# Patient Record
Sex: Female | Born: 1935 | Race: Black or African American | Hispanic: No | Smoking: Former smoker
Health system: Southern US, Community
[De-identification: ages and names within clinical notes are randomized; demographics above are authoritative.]

## PROBLEM LIST (undated history)

## (undated) DIAGNOSIS — R011 Cardiac murmur, unspecified: Secondary | ICD-10-CM

## (undated) DIAGNOSIS — I509 Heart failure, unspecified: Secondary | ICD-10-CM

## (undated) DIAGNOSIS — D126 Benign neoplasm of colon, unspecified: Secondary | ICD-10-CM

## (undated) DIAGNOSIS — J45909 Unspecified asthma, uncomplicated: Secondary | ICD-10-CM

## (undated) DIAGNOSIS — D509 Iron deficiency anemia, unspecified: Secondary | ICD-10-CM

## (undated) DIAGNOSIS — K219 Gastro-esophageal reflux disease without esophagitis: Secondary | ICD-10-CM

## (undated) DIAGNOSIS — R569 Unspecified convulsions: Secondary | ICD-10-CM

## (undated) DIAGNOSIS — K5520 Angiodysplasia of colon without hemorrhage: Secondary | ICD-10-CM

## (undated) DIAGNOSIS — F419 Anxiety disorder, unspecified: Secondary | ICD-10-CM

## (undated) DIAGNOSIS — M1990 Unspecified osteoarthritis, unspecified site: Secondary | ICD-10-CM

## (undated) DIAGNOSIS — K5790 Diverticulosis of intestine, part unspecified, without perforation or abscess without bleeding: Secondary | ICD-10-CM

## (undated) DIAGNOSIS — Z9289 Personal history of other medical treatment: Secondary | ICD-10-CM

## (undated) DIAGNOSIS — K222 Esophageal obstruction: Secondary | ICD-10-CM

## (undated) DIAGNOSIS — N189 Chronic kidney disease, unspecified: Secondary | ICD-10-CM

## (undated) DIAGNOSIS — N841 Polyp of cervix uteri: Secondary | ICD-10-CM

## (undated) DIAGNOSIS — K449 Diaphragmatic hernia without obstruction or gangrene: Secondary | ICD-10-CM

## (undated) DIAGNOSIS — I10 Essential (primary) hypertension: Secondary | ICD-10-CM

## (undated) DIAGNOSIS — I517 Cardiomegaly: Secondary | ICD-10-CM

## (undated) DIAGNOSIS — I2510 Atherosclerotic heart disease of native coronary artery without angina pectoris: Secondary | ICD-10-CM

## (undated) HISTORY — DX: Iron deficiency anemia, unspecified: D50.9

## (undated) HISTORY — PX: CORONARY ANGIOPLASTY: SHX604

## (undated) HISTORY — PX: LAPAROSCOPIC APPENDECTOMY: SUR753

## (undated) HISTORY — DX: Diverticulosis of intestine, part unspecified, without perforation or abscess without bleeding: K57.90

## (undated) HISTORY — DX: Unspecified convulsions: R56.9

## (undated) HISTORY — PX: KNEE ARTHROSCOPY: SUR90

## (undated) HISTORY — PX: COLONOSCOPY: SHX174

## (undated) HISTORY — PX: TONSILLECTOMY: SUR1361

## (undated) HISTORY — PX: COLONOSCOPY W/ BIOPSIES: SHX1374

## (undated) HISTORY — PX: LAPAROSCOPIC CHOLECYSTECTOMY: SUR755

## (undated) HISTORY — PX: CARPAL TUNNEL RELEASE: SHX101

## (undated) HISTORY — DX: Diaphragmatic hernia without obstruction or gangrene: K44.9

## (undated) HISTORY — PX: CARDIAC CATHETERIZATION: SHX172

## (undated) HISTORY — DX: Anxiety disorder, unspecified: F41.9

## (undated) HISTORY — PX: BREAST EXCISIONAL BIOPSY: SUR124

## (undated) HISTORY — DX: Unspecified osteoarthritis, unspecified site: M19.90

## (undated) HISTORY — DX: Benign neoplasm of colon, unspecified: D12.6

## (undated) HISTORY — DX: Atherosclerotic heart disease of native coronary artery without angina pectoris: I25.10

## (undated) HISTORY — DX: Cardiomegaly: I51.7

## (undated) HISTORY — DX: Polyp of cervix uteri: N84.1

## (undated) HISTORY — PX: CATARACT EXTRACTION: SUR2

## (undated) HISTORY — DX: Chronic kidney disease, unspecified: N18.9

## (undated) HISTORY — DX: Esophageal obstruction: K22.2

## (undated) HISTORY — DX: Angiodysplasia of colon without hemorrhage: K55.20

## (undated) HISTORY — PX: JOINT REPLACEMENT: SHX530

## (undated) HISTORY — PX: BREAST SURGERY: SHX581

## (undated) HISTORY — PX: OTHER SURGICAL HISTORY: SHX169

## (undated) HISTORY — DX: Gastro-esophageal reflux disease without esophagitis: K21.9

---

## 1997-12-11 HISTORY — PX: ANGIOPLASTY: SHX39

## 1998-03-17 ENCOUNTER — Other Ambulatory Visit: Admission: RE | Admit: 1998-03-17 | Discharge: 1998-03-17 | Payer: Self-pay | Admitting: Internal Medicine

## 1998-07-10 ENCOUNTER — Emergency Department (HOSPITAL_COMMUNITY): Admission: EM | Admit: 1998-07-10 | Discharge: 1998-07-10 | Payer: Self-pay | Admitting: Emergency Medicine

## 1998-07-23 ENCOUNTER — Ambulatory Visit (HOSPITAL_COMMUNITY): Admission: RE | Admit: 1998-07-23 | Discharge: 1998-07-23 | Payer: Self-pay | Admitting: Family Medicine

## 1998-08-31 ENCOUNTER — Encounter: Admission: RE | Admit: 1998-08-31 | Discharge: 1998-11-29 | Payer: Self-pay | Admitting: Orthopaedic Surgery

## 1999-03-23 ENCOUNTER — Other Ambulatory Visit: Admission: RE | Admit: 1999-03-23 | Discharge: 1999-03-23 | Payer: Self-pay | Admitting: Family Medicine

## 1999-03-30 ENCOUNTER — Ambulatory Visit (HOSPITAL_COMMUNITY): Admission: RE | Admit: 1999-03-30 | Discharge: 1999-03-30 | Payer: Self-pay | Admitting: Family Medicine

## 1999-04-29 ENCOUNTER — Ambulatory Visit (HOSPITAL_COMMUNITY): Admission: RE | Admit: 1999-04-29 | Discharge: 1999-04-29 | Payer: Self-pay | Admitting: Family Medicine

## 1999-05-19 ENCOUNTER — Ambulatory Visit (HOSPITAL_COMMUNITY): Admission: RE | Admit: 1999-05-19 | Discharge: 1999-05-19 | Payer: Self-pay

## 1999-12-28 ENCOUNTER — Encounter: Admission: RE | Admit: 1999-12-28 | Discharge: 2000-03-27 | Payer: Self-pay | Admitting: Podiatry

## 2000-01-28 ENCOUNTER — Encounter: Payer: Self-pay | Admitting: Orthopaedic Surgery

## 2000-01-28 ENCOUNTER — Encounter: Admission: RE | Admit: 2000-01-28 | Discharge: 2000-01-28 | Payer: Self-pay | Admitting: Orthopaedic Surgery

## 2000-04-20 ENCOUNTER — Encounter: Payer: Self-pay | Admitting: Family Medicine

## 2000-04-20 ENCOUNTER — Ambulatory Visit (HOSPITAL_COMMUNITY): Admission: RE | Admit: 2000-04-20 | Discharge: 2000-04-20 | Payer: Self-pay | Admitting: Family Medicine

## 2001-04-17 ENCOUNTER — Ambulatory Visit (HOSPITAL_COMMUNITY): Admission: RE | Admit: 2001-04-17 | Discharge: 2001-04-17 | Payer: Self-pay | Admitting: Gastroenterology

## 2001-04-17 ENCOUNTER — Encounter: Payer: Self-pay | Admitting: Gastroenterology

## 2001-06-18 ENCOUNTER — Encounter: Admission: RE | Admit: 2001-06-18 | Discharge: 2001-07-22 | Payer: Self-pay | Admitting: Orthopaedic Surgery

## 2001-10-04 ENCOUNTER — Ambulatory Visit (HOSPITAL_COMMUNITY): Admission: RE | Admit: 2001-10-04 | Discharge: 2001-10-04 | Payer: Self-pay | Admitting: Family Medicine

## 2001-10-04 ENCOUNTER — Encounter: Payer: Self-pay | Admitting: Family Medicine

## 2001-12-02 ENCOUNTER — Encounter: Payer: Self-pay | Admitting: Family Medicine

## 2001-12-02 ENCOUNTER — Ambulatory Visit (HOSPITAL_COMMUNITY): Admission: RE | Admit: 2001-12-02 | Discharge: 2001-12-02 | Payer: Self-pay | Admitting: Family Medicine

## 2001-12-09 ENCOUNTER — Other Ambulatory Visit: Admission: RE | Admit: 2001-12-09 | Discharge: 2001-12-09 | Payer: Self-pay | Admitting: Family Medicine

## 2001-12-18 ENCOUNTER — Ambulatory Visit (HOSPITAL_COMMUNITY): Admission: RE | Admit: 2001-12-18 | Discharge: 2001-12-18 | Payer: Self-pay | Admitting: Family Medicine

## 2002-01-16 ENCOUNTER — Ambulatory Visit (HOSPITAL_COMMUNITY): Admission: RE | Admit: 2002-01-16 | Discharge: 2002-01-16 | Payer: Self-pay | Admitting: Family Medicine

## 2002-01-16 ENCOUNTER — Encounter: Payer: Self-pay | Admitting: Family Medicine

## 2002-01-17 ENCOUNTER — Emergency Department (HOSPITAL_COMMUNITY): Admission: EM | Admit: 2002-01-17 | Discharge: 2002-01-17 | Payer: Self-pay | Admitting: Emergency Medicine

## 2002-01-17 ENCOUNTER — Encounter: Payer: Self-pay | Admitting: Emergency Medicine

## 2002-01-21 ENCOUNTER — Encounter: Payer: Self-pay | Admitting: *Deleted

## 2002-01-23 ENCOUNTER — Inpatient Hospital Stay (HOSPITAL_COMMUNITY): Admission: RE | Admit: 2002-01-23 | Discharge: 2002-01-29 | Payer: Self-pay | Admitting: *Deleted

## 2002-01-23 HISTORY — PX: OTHER SURGICAL HISTORY: SHX169

## 2002-01-27 ENCOUNTER — Encounter: Payer: Self-pay | Admitting: *Deleted

## 2002-01-29 ENCOUNTER — Inpatient Hospital Stay (HOSPITAL_COMMUNITY)
Admission: RE | Admit: 2002-01-29 | Discharge: 2002-02-04 | Payer: Self-pay | Admitting: Physical Medicine & Rehabilitation

## 2002-02-02 ENCOUNTER — Encounter: Payer: Self-pay | Admitting: Physical Medicine & Rehabilitation

## 2002-04-10 ENCOUNTER — Inpatient Hospital Stay (HOSPITAL_COMMUNITY): Admission: RE | Admit: 2002-04-10 | Discharge: 2002-04-15 | Payer: Self-pay | Admitting: *Deleted

## 2002-04-15 ENCOUNTER — Inpatient Hospital Stay (HOSPITAL_COMMUNITY)
Admission: AD | Admit: 2002-04-15 | Discharge: 2002-04-22 | Payer: Self-pay | Admitting: Physical Medicine & Rehabilitation

## 2002-05-24 ENCOUNTER — Encounter: Payer: Self-pay | Admitting: Emergency Medicine

## 2002-05-24 ENCOUNTER — Emergency Department (HOSPITAL_COMMUNITY): Admission: EM | Admit: 2002-05-24 | Discharge: 2002-05-24 | Payer: Self-pay

## 2002-11-10 ENCOUNTER — Encounter: Payer: Self-pay | Admitting: Family Medicine

## 2002-11-10 ENCOUNTER — Ambulatory Visit (HOSPITAL_COMMUNITY): Admission: RE | Admit: 2002-11-10 | Discharge: 2002-11-10 | Payer: Self-pay | Admitting: Family Medicine

## 2002-12-24 ENCOUNTER — Ambulatory Visit (HOSPITAL_COMMUNITY): Admission: RE | Admit: 2002-12-24 | Discharge: 2002-12-24 | Payer: Self-pay | Admitting: Otolaryngology

## 2002-12-24 ENCOUNTER — Encounter: Payer: Self-pay | Admitting: Otolaryngology

## 2003-12-29 ENCOUNTER — Inpatient Hospital Stay (HOSPITAL_COMMUNITY): Admission: RE | Admit: 2003-12-29 | Discharge: 2004-01-05 | Payer: Self-pay | Admitting: Orthopedic Surgery

## 2004-01-05 ENCOUNTER — Inpatient Hospital Stay (HOSPITAL_COMMUNITY)
Admission: RE | Admit: 2004-01-05 | Discharge: 2004-01-13 | Payer: Self-pay | Admitting: Physical Medicine & Rehabilitation

## 2004-05-06 ENCOUNTER — Encounter: Admission: RE | Admit: 2004-05-06 | Discharge: 2004-05-06 | Payer: Self-pay | Admitting: Family Medicine

## 2004-05-10 ENCOUNTER — Ambulatory Visit (HOSPITAL_COMMUNITY): Admission: RE | Admit: 2004-05-10 | Discharge: 2004-05-10 | Payer: Self-pay | Admitting: Family Medicine

## 2004-06-01 ENCOUNTER — Encounter (INDEPENDENT_AMBULATORY_CARE_PROVIDER_SITE_OTHER): Payer: Self-pay | Admitting: *Deleted

## 2004-06-01 ENCOUNTER — Ambulatory Visit (HOSPITAL_COMMUNITY): Admission: RE | Admit: 2004-06-01 | Discharge: 2004-06-01 | Payer: Self-pay | Admitting: General Surgery

## 2004-06-01 HISTORY — PX: OTHER SURGICAL HISTORY: SHX169

## 2004-09-02 ENCOUNTER — Emergency Department (HOSPITAL_COMMUNITY): Admission: EM | Admit: 2004-09-02 | Discharge: 2004-09-02 | Payer: Self-pay | Admitting: Emergency Medicine

## 2004-10-03 ENCOUNTER — Ambulatory Visit: Payer: Self-pay | Admitting: Family Medicine

## 2004-11-22 ENCOUNTER — Ambulatory Visit: Payer: Self-pay | Admitting: Family Medicine

## 2004-12-19 ENCOUNTER — Ambulatory Visit: Payer: Self-pay | Admitting: Family Medicine

## 2005-02-13 ENCOUNTER — Ambulatory Visit: Payer: Self-pay | Admitting: Family Medicine

## 2005-04-10 ENCOUNTER — Emergency Department (HOSPITAL_COMMUNITY): Admission: EM | Admit: 2005-04-10 | Discharge: 2005-04-10 | Payer: Self-pay

## 2005-06-02 ENCOUNTER — Ambulatory Visit: Payer: Self-pay | Admitting: Family Medicine

## 2005-08-15 ENCOUNTER — Ambulatory Visit: Payer: Self-pay | Admitting: Family Medicine

## 2005-09-11 ENCOUNTER — Ambulatory Visit (HOSPITAL_COMMUNITY): Admission: RE | Admit: 2005-09-11 | Discharge: 2005-09-11 | Payer: Self-pay | Admitting: Ophthalmology

## 2005-09-26 ENCOUNTER — Ambulatory Visit: Payer: Self-pay | Admitting: Family Medicine

## 2005-09-28 ENCOUNTER — Ambulatory Visit: Payer: Self-pay | Admitting: Family Medicine

## 2005-10-05 ENCOUNTER — Encounter: Admission: RE | Admit: 2005-10-05 | Discharge: 2005-10-05 | Payer: Self-pay | Admitting: General Surgery

## 2005-11-13 ENCOUNTER — Ambulatory Visit: Payer: Self-pay | Admitting: Family Medicine

## 2005-12-18 ENCOUNTER — Ambulatory Visit: Payer: Self-pay | Admitting: Family Medicine

## 2006-01-22 ENCOUNTER — Ambulatory Visit: Payer: Self-pay | Admitting: Family Medicine

## 2006-02-23 ENCOUNTER — Ambulatory Visit: Payer: Self-pay | Admitting: Family Medicine

## 2006-02-23 ENCOUNTER — Observation Stay (HOSPITAL_COMMUNITY): Admission: EM | Admit: 2006-02-23 | Discharge: 2006-02-24 | Payer: Self-pay | Admitting: Emergency Medicine

## 2006-02-23 ENCOUNTER — Encounter (INDEPENDENT_AMBULATORY_CARE_PROVIDER_SITE_OTHER): Payer: Self-pay | Admitting: *Deleted

## 2006-03-03 ENCOUNTER — Emergency Department (HOSPITAL_COMMUNITY): Admission: EM | Admit: 2006-03-03 | Discharge: 2006-03-03 | Payer: Self-pay | Admitting: Emergency Medicine

## 2006-03-15 ENCOUNTER — Emergency Department (HOSPITAL_COMMUNITY): Admission: EM | Admit: 2006-03-15 | Discharge: 2006-03-15 | Payer: Self-pay | Admitting: *Deleted

## 2006-03-15 ENCOUNTER — Ambulatory Visit: Payer: Self-pay | Admitting: Family Medicine

## 2006-04-10 ENCOUNTER — Ambulatory Visit: Payer: Self-pay | Admitting: Family Medicine

## 2006-05-15 ENCOUNTER — Ambulatory Visit: Payer: Self-pay | Admitting: Family Medicine

## 2006-05-25 ENCOUNTER — Other Ambulatory Visit: Admission: RE | Admit: 2006-05-25 | Discharge: 2006-05-25 | Payer: Self-pay | Admitting: Family Medicine

## 2006-05-25 ENCOUNTER — Encounter: Payer: Self-pay | Admitting: Family Medicine

## 2006-05-25 ENCOUNTER — Ambulatory Visit: Payer: Self-pay | Admitting: Family Medicine

## 2006-06-08 ENCOUNTER — Encounter (INDEPENDENT_AMBULATORY_CARE_PROVIDER_SITE_OTHER): Payer: Self-pay | Admitting: Family Medicine

## 2006-06-08 LAB — CONVERTED CEMR LAB: Pap Smear: NORMAL

## 2006-06-27 ENCOUNTER — Ambulatory Visit: Payer: Self-pay | Admitting: Family Medicine

## 2006-07-13 ENCOUNTER — Emergency Department (HOSPITAL_COMMUNITY): Admission: EM | Admit: 2006-07-13 | Discharge: 2006-07-14 | Payer: Self-pay | Admitting: Emergency Medicine

## 2006-07-16 ENCOUNTER — Ambulatory Visit: Payer: Self-pay | Admitting: Internal Medicine

## 2006-07-30 ENCOUNTER — Emergency Department (HOSPITAL_COMMUNITY): Admission: EM | Admit: 2006-07-30 | Discharge: 2006-07-30 | Payer: Self-pay | Admitting: Emergency Medicine

## 2006-08-03 ENCOUNTER — Ambulatory Visit: Payer: Self-pay | Admitting: Internal Medicine

## 2006-08-07 ENCOUNTER — Ambulatory Visit: Payer: Self-pay | Admitting: Family Medicine

## 2006-08-08 ENCOUNTER — Ambulatory Visit (HOSPITAL_COMMUNITY): Admission: RE | Admit: 2006-08-08 | Discharge: 2006-08-08 | Payer: Self-pay | Admitting: General Surgery

## 2006-08-08 HISTORY — PX: INGUINAL HERNIA REPAIR: SUR1180

## 2006-10-16 ENCOUNTER — Encounter: Admission: RE | Admit: 2006-10-16 | Discharge: 2006-10-16 | Payer: Self-pay | Admitting: General Surgery

## 2006-12-06 ENCOUNTER — Emergency Department (HOSPITAL_COMMUNITY): Admission: EM | Admit: 2006-12-06 | Discharge: 2006-12-07 | Payer: Self-pay | Admitting: Emergency Medicine

## 2006-12-18 HISTORY — PX: OTHER SURGICAL HISTORY: SHX169

## 2006-12-19 ENCOUNTER — Inpatient Hospital Stay (HOSPITAL_COMMUNITY): Admission: RE | Admit: 2006-12-19 | Discharge: 2006-12-21 | Payer: Self-pay | Admitting: Orthopedic Surgery

## 2007-01-18 ENCOUNTER — Inpatient Hospital Stay (HOSPITAL_COMMUNITY): Admission: EM | Admit: 2007-01-18 | Discharge: 2007-01-25 | Payer: Self-pay | Admitting: Emergency Medicine

## 2007-01-21 ENCOUNTER — Encounter (INDEPENDENT_AMBULATORY_CARE_PROVIDER_SITE_OTHER): Payer: Self-pay | Admitting: *Deleted

## 2007-02-12 ENCOUNTER — Ambulatory Visit: Payer: Self-pay | Admitting: Family Medicine

## 2007-02-19 ENCOUNTER — Ambulatory Visit: Payer: Self-pay | Admitting: Family Medicine

## 2007-02-25 ENCOUNTER — Ambulatory Visit (HOSPITAL_COMMUNITY): Admission: RE | Admit: 2007-02-25 | Discharge: 2007-02-25 | Payer: Self-pay | Admitting: Gastroenterology

## 2007-04-09 ENCOUNTER — Encounter: Admission: RE | Admit: 2007-04-09 | Discharge: 2007-07-08 | Payer: Self-pay | Admitting: Orthopedic Surgery

## 2007-05-08 ENCOUNTER — Ambulatory Visit: Payer: Self-pay | Admitting: Family Medicine

## 2007-06-22 ENCOUNTER — Encounter (INDEPENDENT_AMBULATORY_CARE_PROVIDER_SITE_OTHER): Payer: Self-pay | Admitting: Family Medicine

## 2007-06-22 DIAGNOSIS — I10 Essential (primary) hypertension: Secondary | ICD-10-CM

## 2007-06-26 ENCOUNTER — Ambulatory Visit: Payer: Self-pay | Admitting: Family Medicine

## 2007-07-29 ENCOUNTER — Ambulatory Visit: Payer: Self-pay | Admitting: Family Medicine

## 2007-07-30 ENCOUNTER — Ambulatory Visit: Payer: Self-pay | Admitting: Family Medicine

## 2007-07-31 ENCOUNTER — Encounter (INDEPENDENT_AMBULATORY_CARE_PROVIDER_SITE_OTHER): Payer: Self-pay | Admitting: Family Medicine

## 2007-07-31 LAB — CONVERTED CEMR LAB
Alkaline Phosphatase: 130 units/L — ABNORMAL HIGH (ref 39–117)
Basophils Relative: 0 % (ref 0–1)
Cholesterol: 204 mg/dL — ABNORMAL HIGH (ref 0–200)
Hemoglobin: 12.7 g/dL (ref 12.0–15.0)
LDL Cholesterol: 116 mg/dL — ABNORMAL HIGH (ref 0–99)
Lymphs Abs: 2.2 10*3/uL (ref 0.7–3.3)
MCV: 94.4 fL (ref 78.0–100.0)
Monocytes Absolute: 0.4 10*3/uL (ref 0.2–0.7)
Monocytes Relative: 6 % (ref 3–11)
Neutrophils Relative %: 56 % (ref 43–77)
Potassium: 4.2 meq/L (ref 3.5–5.3)
RBC: 4.12 M/uL (ref 3.87–5.11)
RDW: 13.2 % (ref 11.5–14.0)
Sodium: 133 meq/L — ABNORMAL LOW (ref 135–145)
Total Bilirubin: 0.4 mg/dL (ref 0.3–1.2)
Total CHOL/HDL Ratio: 3.1
VLDL: 23 mg/dL (ref 0–40)
WBC: 6.1 10*3/uL (ref 4.0–10.5)

## 2007-09-04 ENCOUNTER — Ambulatory Visit: Payer: Self-pay | Admitting: Nurse Practitioner

## 2007-09-04 ENCOUNTER — Encounter (INDEPENDENT_AMBULATORY_CARE_PROVIDER_SITE_OTHER): Payer: Self-pay | Admitting: Family Medicine

## 2007-09-16 ENCOUNTER — Encounter (INDEPENDENT_AMBULATORY_CARE_PROVIDER_SITE_OTHER): Payer: Self-pay | Admitting: Family Medicine

## 2007-09-24 ENCOUNTER — Ambulatory Visit: Payer: Self-pay | Admitting: Vascular Surgery

## 2007-10-21 ENCOUNTER — Encounter: Admission: RE | Admit: 2007-10-21 | Discharge: 2007-10-21 | Payer: Self-pay | Admitting: Family Medicine

## 2007-10-22 ENCOUNTER — Ambulatory Visit: Payer: Self-pay | Admitting: Family Medicine

## 2007-11-12 ENCOUNTER — Ambulatory Visit: Payer: Self-pay | Admitting: Internal Medicine

## 2007-11-12 ENCOUNTER — Ambulatory Visit (HOSPITAL_COMMUNITY): Admission: RE | Admit: 2007-11-12 | Discharge: 2007-11-12 | Payer: Self-pay | Admitting: Internal Medicine

## 2007-11-12 LAB — CONVERTED CEMR LAB
ALT: 12 units/L (ref 0–35)
Albumin: 4 g/dL (ref 3.5–5.2)
Alkaline Phosphatase: 139 units/L — ABNORMAL HIGH (ref 39–117)
CO2: 22 meq/L (ref 19–32)
Calcium: 9.4 mg/dL (ref 8.4–10.5)
Chloride: 101 meq/L (ref 96–112)
Glucose, Bld: 84 mg/dL (ref 70–99)
Hemoglobin: 11.5 g/dL — ABNORMAL LOW (ref 12.0–15.0)
MCHC: 33 g/dL (ref 30.0–36.0)
Monocytes Absolute: 0.5 10*3/uL (ref 0.1–1.0)
Neutrophils Relative %: 67 % (ref 43–77)
RBC: 3.72 M/uL — ABNORMAL LOW (ref 3.87–5.11)
Total Bilirubin: 0.6 mg/dL (ref 0.3–1.2)
Total Protein: 7 g/dL (ref 6.0–8.3)
WBC: 7.9 10*3/uL (ref 4.0–10.5)

## 2007-11-22 ENCOUNTER — Emergency Department (HOSPITAL_COMMUNITY): Admission: EM | Admit: 2007-11-22 | Discharge: 2007-11-22 | Payer: Self-pay | Admitting: Emergency Medicine

## 2007-11-26 ENCOUNTER — Emergency Department (HOSPITAL_COMMUNITY): Admission: EM | Admit: 2007-11-26 | Discharge: 2007-11-26 | Payer: Self-pay | Admitting: Emergency Medicine

## 2007-12-13 ENCOUNTER — Encounter (INDEPENDENT_AMBULATORY_CARE_PROVIDER_SITE_OTHER): Payer: Self-pay | Admitting: Family Medicine

## 2007-12-26 ENCOUNTER — Encounter (HOSPITAL_BASED_OUTPATIENT_CLINIC_OR_DEPARTMENT_OTHER): Payer: Self-pay | Admitting: General Surgery

## 2007-12-26 ENCOUNTER — Ambulatory Visit (HOSPITAL_COMMUNITY): Admission: RE | Admit: 2007-12-26 | Discharge: 2007-12-27 | Payer: Self-pay | Admitting: General Surgery

## 2008-01-15 ENCOUNTER — Ambulatory Visit: Payer: Self-pay | Admitting: Family Medicine

## 2008-01-15 LAB — CONVERTED CEMR LAB
Alkaline Phosphatase: 108 units/L (ref 39–117)
CO2: 26 meq/L (ref 19–32)
Chloride: 95 meq/L — ABNORMAL LOW (ref 96–112)
Creatinine, Ser: 1.22 mg/dL — ABNORMAL HIGH (ref 0.40–1.20)
Glucose, Bld: 92 mg/dL (ref 70–99)
Phenobarbital: 45.3 ug/mL — ABNORMAL HIGH (ref 15.0–40.0)
Potassium: 3.4 meq/L — ABNORMAL LOW (ref 3.5–5.3)
Sodium: 136 meq/L (ref 135–145)

## 2008-01-16 ENCOUNTER — Telehealth (INDEPENDENT_AMBULATORY_CARE_PROVIDER_SITE_OTHER): Payer: Self-pay | Admitting: Nurse Practitioner

## 2008-01-19 ENCOUNTER — Emergency Department (HOSPITAL_COMMUNITY): Admission: EM | Admit: 2008-01-19 | Discharge: 2008-01-20 | Payer: Self-pay | Admitting: Emergency Medicine

## 2008-01-21 ENCOUNTER — Telehealth (INDEPENDENT_AMBULATORY_CARE_PROVIDER_SITE_OTHER): Payer: Self-pay | Admitting: Family Medicine

## 2008-01-24 ENCOUNTER — Ambulatory Visit (HOSPITAL_COMMUNITY): Admission: RE | Admit: 2008-01-24 | Discharge: 2008-01-24 | Payer: Self-pay | Admitting: Family Medicine

## 2008-01-27 ENCOUNTER — Telehealth (INDEPENDENT_AMBULATORY_CARE_PROVIDER_SITE_OTHER): Payer: Self-pay | Admitting: Family Medicine

## 2008-01-30 ENCOUNTER — Encounter (INDEPENDENT_AMBULATORY_CARE_PROVIDER_SITE_OTHER): Payer: Self-pay | Admitting: Family Medicine

## 2008-02-05 DIAGNOSIS — G40909 Epilepsy, unspecified, not intractable, without status epilepticus: Secondary | ICD-10-CM | POA: Insufficient documentation

## 2008-02-07 ENCOUNTER — Encounter (INDEPENDENT_AMBULATORY_CARE_PROVIDER_SITE_OTHER): Payer: Self-pay | Admitting: Family Medicine

## 2008-02-12 ENCOUNTER — Encounter (INDEPENDENT_AMBULATORY_CARE_PROVIDER_SITE_OTHER): Payer: Self-pay | Admitting: Family Medicine

## 2008-02-14 ENCOUNTER — Ambulatory Visit: Payer: Self-pay | Admitting: Internal Medicine

## 2008-02-19 ENCOUNTER — Emergency Department (HOSPITAL_COMMUNITY): Admission: EM | Admit: 2008-02-19 | Discharge: 2008-02-19 | Payer: Self-pay | Admitting: Emergency Medicine

## 2008-02-26 ENCOUNTER — Encounter (INDEPENDENT_AMBULATORY_CARE_PROVIDER_SITE_OTHER): Payer: Self-pay | Admitting: Family Medicine

## 2008-03-04 ENCOUNTER — Encounter (INDEPENDENT_AMBULATORY_CARE_PROVIDER_SITE_OTHER): Payer: Self-pay | Admitting: Family Medicine

## 2008-03-09 ENCOUNTER — Ambulatory Visit: Payer: Self-pay | Admitting: Family Medicine

## 2008-03-13 ENCOUNTER — Encounter (INDEPENDENT_AMBULATORY_CARE_PROVIDER_SITE_OTHER): Payer: Self-pay | Admitting: Family Medicine

## 2008-03-13 LAB — CONVERTED CEMR LAB
Creatinine 24 HR UR: 849 mg/24hr (ref 700–1800)
Creatinine, Urine: 31.4 mg/dL
Protein, Ur: 594 mg/24hr — ABNORMAL HIGH (ref 50–100)

## 2008-03-16 ENCOUNTER — Telehealth (INDEPENDENT_AMBULATORY_CARE_PROVIDER_SITE_OTHER): Payer: Self-pay | Admitting: Family Medicine

## 2008-03-25 ENCOUNTER — Encounter (INDEPENDENT_AMBULATORY_CARE_PROVIDER_SITE_OTHER): Payer: Self-pay | Admitting: Family Medicine

## 2008-03-25 LAB — CONVERTED CEMR LAB
Creatinine 24 HR UR: 836 mg/24hr (ref 700–1800)
Creatinine, Urine: 31 mg/dL

## 2008-05-08 ENCOUNTER — Encounter (INDEPENDENT_AMBULATORY_CARE_PROVIDER_SITE_OTHER): Payer: Self-pay | Admitting: Family Medicine

## 2008-07-07 ENCOUNTER — Encounter (INDEPENDENT_AMBULATORY_CARE_PROVIDER_SITE_OTHER): Payer: Self-pay | Admitting: Family Medicine

## 2008-09-08 ENCOUNTER — Telehealth (INDEPENDENT_AMBULATORY_CARE_PROVIDER_SITE_OTHER): Payer: Self-pay | Admitting: Family Medicine

## 2008-09-21 ENCOUNTER — Encounter (INDEPENDENT_AMBULATORY_CARE_PROVIDER_SITE_OTHER): Payer: Self-pay | Admitting: Family Medicine

## 2008-10-12 ENCOUNTER — Telehealth (INDEPENDENT_AMBULATORY_CARE_PROVIDER_SITE_OTHER): Payer: Self-pay | Admitting: Family Medicine

## 2008-11-03 ENCOUNTER — Telehealth (INDEPENDENT_AMBULATORY_CARE_PROVIDER_SITE_OTHER): Payer: Self-pay | Admitting: Family Medicine

## 2008-11-09 ENCOUNTER — Ambulatory Visit: Payer: Self-pay | Admitting: Family Medicine

## 2008-12-24 ENCOUNTER — Encounter: Admission: RE | Admit: 2008-12-24 | Discharge: 2008-12-24 | Payer: Self-pay | Admitting: Family Medicine

## 2008-12-31 ENCOUNTER — Encounter (INDEPENDENT_AMBULATORY_CARE_PROVIDER_SITE_OTHER): Payer: Self-pay | Admitting: Family Medicine

## 2009-01-06 ENCOUNTER — Ambulatory Visit: Payer: Self-pay | Admitting: Nurse Practitioner

## 2009-01-07 ENCOUNTER — Telehealth (INDEPENDENT_AMBULATORY_CARE_PROVIDER_SITE_OTHER): Payer: Self-pay | Admitting: *Deleted

## 2009-01-14 ENCOUNTER — Encounter (INDEPENDENT_AMBULATORY_CARE_PROVIDER_SITE_OTHER): Payer: Self-pay | Admitting: Family Medicine

## 2009-01-15 ENCOUNTER — Telehealth (INDEPENDENT_AMBULATORY_CARE_PROVIDER_SITE_OTHER): Payer: Self-pay | Admitting: *Deleted

## 2009-01-20 ENCOUNTER — Ambulatory Visit: Payer: Self-pay | Admitting: Family Medicine

## 2009-01-21 ENCOUNTER — Telehealth (INDEPENDENT_AMBULATORY_CARE_PROVIDER_SITE_OTHER): Payer: Self-pay | Admitting: *Deleted

## 2009-01-21 ENCOUNTER — Encounter (INDEPENDENT_AMBULATORY_CARE_PROVIDER_SITE_OTHER): Payer: Self-pay | Admitting: Family Medicine

## 2009-01-28 ENCOUNTER — Encounter (INDEPENDENT_AMBULATORY_CARE_PROVIDER_SITE_OTHER): Payer: Self-pay | Admitting: Family Medicine

## 2009-02-22 ENCOUNTER — Encounter (INDEPENDENT_AMBULATORY_CARE_PROVIDER_SITE_OTHER): Payer: Self-pay | Admitting: Family Medicine

## 2009-03-15 ENCOUNTER — Telehealth (INDEPENDENT_AMBULATORY_CARE_PROVIDER_SITE_OTHER): Payer: Self-pay | Admitting: Family Medicine

## 2009-03-17 ENCOUNTER — Ambulatory Visit: Payer: Self-pay | Admitting: Family Medicine

## 2009-03-19 ENCOUNTER — Encounter (INDEPENDENT_AMBULATORY_CARE_PROVIDER_SITE_OTHER): Payer: Self-pay | Admitting: Family Medicine

## 2009-03-21 ENCOUNTER — Telehealth (INDEPENDENT_AMBULATORY_CARE_PROVIDER_SITE_OTHER): Payer: Self-pay | Admitting: Family Medicine

## 2009-03-21 LAB — CONVERTED CEMR LAB
AST: 16 units/L (ref 0–37)
Basophils Absolute: 0 10*3/uL (ref 0.0–0.1)
Calcium: 9.6 mg/dL (ref 8.4–10.5)
Cholesterol: 206 mg/dL — ABNORMAL HIGH (ref 0–200)
Eosinophils Relative: 2 % (ref 0–5)
Lymphs Abs: 1.9 10*3/uL (ref 0.7–4.0)
MCHC: 32.3 g/dL (ref 30.0–36.0)
MCV: 93.9 fL (ref 78.0–100.0)
Monocytes Relative: 8 % (ref 3–12)
Neutro Abs: 4.6 10*3/uL (ref 1.7–7.7)
Neutrophils Relative %: 63 % (ref 43–77)
Potassium: 4.7 meq/L (ref 3.5–5.3)
Total CHOL/HDL Ratio: 2.8
Triglycerides: 106 mg/dL (ref <150)

## 2009-03-24 ENCOUNTER — Encounter (INDEPENDENT_AMBULATORY_CARE_PROVIDER_SITE_OTHER): Payer: Self-pay | Admitting: Family Medicine

## 2009-04-05 ENCOUNTER — Ambulatory Visit: Payer: Self-pay | Admitting: Family Medicine

## 2009-04-08 LAB — CONVERTED CEMR LAB
BUN: 23 mg/dL (ref 6–23)
CO2: 18 meq/L — ABNORMAL LOW (ref 19–32)
Calcium: 9.3 mg/dL (ref 8.4–10.5)
Chloride: 99 meq/L (ref 96–112)
Creatinine, Ser: 1.48 mg/dL — ABNORMAL HIGH (ref 0.40–1.20)
Ferritin: 31 ng/mL (ref 10–291)
Glucose, Bld: 69 mg/dL — ABNORMAL LOW (ref 70–99)
Iron: 62 ug/dL (ref 42–145)
Potassium: 4.9 meq/L (ref 3.5–5.3)
Saturation Ratios: 24 % (ref 20–55)
Sodium: 135 meq/L (ref 135–145)
TIBC: 260 ug/dL (ref 250–470)
UIBC: 198 ug/dL

## 2009-04-09 ENCOUNTER — Telehealth (INDEPENDENT_AMBULATORY_CARE_PROVIDER_SITE_OTHER): Payer: Self-pay | Admitting: *Deleted

## 2009-04-26 ENCOUNTER — Encounter (INDEPENDENT_AMBULATORY_CARE_PROVIDER_SITE_OTHER): Payer: Self-pay | Admitting: Family Medicine

## 2009-04-28 ENCOUNTER — Encounter (INDEPENDENT_AMBULATORY_CARE_PROVIDER_SITE_OTHER): Payer: Self-pay | Admitting: Family Medicine

## 2009-05-03 ENCOUNTER — Emergency Department (HOSPITAL_COMMUNITY): Admission: EM | Admit: 2009-05-03 | Discharge: 2009-05-03 | Payer: Self-pay | Admitting: Emergency Medicine

## 2009-05-17 ENCOUNTER — Telehealth (INDEPENDENT_AMBULATORY_CARE_PROVIDER_SITE_OTHER): Payer: Self-pay | Admitting: Family Medicine

## 2009-05-18 ENCOUNTER — Encounter: Admission: RE | Admit: 2009-05-18 | Discharge: 2009-05-18 | Payer: Self-pay | Admitting: Podiatry

## 2009-06-04 ENCOUNTER — Encounter (INDEPENDENT_AMBULATORY_CARE_PROVIDER_SITE_OTHER): Payer: Self-pay | Admitting: Nurse Practitioner

## 2009-06-04 ENCOUNTER — Ambulatory Visit: Payer: Self-pay | Admitting: Internal Medicine

## 2009-06-07 LAB — CONVERTED CEMR LAB: Sodium: 136 meq/L (ref 135–145)

## 2009-06-08 ENCOUNTER — Emergency Department (HOSPITAL_COMMUNITY): Admission: EM | Admit: 2009-06-08 | Discharge: 2009-06-08 | Payer: Self-pay | Admitting: Emergency Medicine

## 2009-06-11 ENCOUNTER — Encounter: Admission: RE | Admit: 2009-06-11 | Discharge: 2009-09-09 | Payer: Self-pay | Admitting: Otolaryngology

## 2009-06-14 ENCOUNTER — Inpatient Hospital Stay (HOSPITAL_COMMUNITY): Admission: EM | Admit: 2009-06-14 | Discharge: 2009-06-17 | Payer: Self-pay | Admitting: Emergency Medicine

## 2009-06-15 ENCOUNTER — Ambulatory Visit: Payer: Self-pay | Admitting: Vascular Surgery

## 2009-06-15 ENCOUNTER — Encounter (INDEPENDENT_AMBULATORY_CARE_PROVIDER_SITE_OTHER): Payer: Self-pay | Admitting: Internal Medicine

## 2009-06-17 ENCOUNTER — Encounter (INDEPENDENT_AMBULATORY_CARE_PROVIDER_SITE_OTHER): Payer: Self-pay | Admitting: Nurse Practitioner

## 2009-06-17 LAB — CONVERTED CEMR LAB
Glucose, Urine, Semiquant: 82
Hemoglobin: 11.2 g/dL
MCV: 94.4 fL
Sodium: 136 meq/L

## 2009-07-09 ENCOUNTER — Ambulatory Visit: Payer: Self-pay | Admitting: Physician Assistant

## 2009-07-19 ENCOUNTER — Telehealth: Payer: Self-pay | Admitting: Physician Assistant

## 2009-07-20 ENCOUNTER — Encounter: Payer: Self-pay | Admitting: Physician Assistant

## 2009-07-23 ENCOUNTER — Ambulatory Visit: Payer: Self-pay | Admitting: Physician Assistant

## 2009-07-27 ENCOUNTER — Ambulatory Visit: Payer: Self-pay | Admitting: Physician Assistant

## 2009-08-30 ENCOUNTER — Ambulatory Visit: Payer: Self-pay | Admitting: Physician Assistant

## 2009-08-31 LAB — CONVERTED CEMR LAB
BUN: 29 mg/dL — ABNORMAL HIGH (ref 6–23)
Creatinine, Ser: 1.52 mg/dL — ABNORMAL HIGH (ref 0.40–1.20)
Glucose, Bld: 98 mg/dL (ref 70–99)
Potassium: 5.2 meq/L (ref 3.5–5.3)

## 2009-09-02 ENCOUNTER — Ambulatory Visit: Payer: Self-pay | Admitting: Physician Assistant

## 2009-09-02 LAB — CONVERTED CEMR LAB: Calcium: 9.3 mg/dL (ref 8.4–10.5)

## 2009-09-03 ENCOUNTER — Encounter: Payer: Self-pay | Admitting: Physician Assistant

## 2009-09-07 ENCOUNTER — Emergency Department (HOSPITAL_COMMUNITY): Admission: EM | Admit: 2009-09-07 | Discharge: 2009-09-07 | Payer: Self-pay | Admitting: Emergency Medicine

## 2009-12-01 ENCOUNTER — Ambulatory Visit: Payer: Self-pay | Admitting: Physician Assistant

## 2009-12-01 LAB — CONVERTED CEMR LAB
Albumin: 4.4 g/dL (ref 3.5–5.2)
Alkaline Phosphatase: 113 units/L (ref 39–117)
BUN: 22 mg/dL (ref 6–23)
Calcium: 9.7 mg/dL (ref 8.4–10.5)

## 2009-12-02 ENCOUNTER — Encounter: Payer: Self-pay | Admitting: Physician Assistant

## 2010-01-03 ENCOUNTER — Telehealth: Payer: Self-pay | Admitting: Physician Assistant

## 2010-01-04 ENCOUNTER — Emergency Department (HOSPITAL_COMMUNITY): Admission: EM | Admit: 2010-01-04 | Discharge: 2010-01-04 | Payer: Self-pay | Admitting: Family Medicine

## 2010-01-04 ENCOUNTER — Telehealth: Payer: Self-pay | Admitting: Physician Assistant

## 2010-01-05 ENCOUNTER — Inpatient Hospital Stay (HOSPITAL_COMMUNITY): Admission: EM | Admit: 2010-01-05 | Discharge: 2010-01-25 | Payer: Self-pay | Admitting: Emergency Medicine

## 2010-01-11 ENCOUNTER — Telehealth: Payer: Self-pay | Admitting: Physician Assistant

## 2010-01-12 ENCOUNTER — Encounter: Payer: Self-pay | Admitting: Physician Assistant

## 2010-01-21 ENCOUNTER — Encounter: Payer: Self-pay | Admitting: Physician Assistant

## 2010-01-28 ENCOUNTER — Telehealth: Payer: Self-pay | Admitting: Physician Assistant

## 2010-02-04 ENCOUNTER — Encounter: Payer: Self-pay | Admitting: Physician Assistant

## 2010-02-09 ENCOUNTER — Telehealth: Payer: Self-pay | Admitting: Physician Assistant

## 2010-02-09 ENCOUNTER — Encounter: Payer: Self-pay | Admitting: Physician Assistant

## 2010-02-14 ENCOUNTER — Inpatient Hospital Stay (HOSPITAL_COMMUNITY): Admission: EM | Admit: 2010-02-14 | Discharge: 2010-02-24 | Payer: Self-pay | Admitting: Emergency Medicine

## 2010-02-14 ENCOUNTER — Encounter: Payer: Self-pay | Admitting: Physician Assistant

## 2010-02-23 ENCOUNTER — Telehealth: Payer: Self-pay | Admitting: Physician Assistant

## 2010-03-24 ENCOUNTER — Ambulatory Visit (HOSPITAL_COMMUNITY): Admission: RE | Admit: 2010-03-24 | Discharge: 2010-03-24 | Payer: Self-pay | Admitting: Internal Medicine

## 2010-04-16 ENCOUNTER — Emergency Department (HOSPITAL_COMMUNITY): Admission: EM | Admit: 2010-04-16 | Discharge: 2010-04-16 | Payer: Self-pay | Admitting: Family Medicine

## 2010-04-24 ENCOUNTER — Inpatient Hospital Stay (HOSPITAL_COMMUNITY): Admission: EM | Admit: 2010-04-24 | Discharge: 2010-04-27 | Payer: Self-pay | Admitting: Emergency Medicine

## 2010-04-25 ENCOUNTER — Ambulatory Visit: Payer: Self-pay | Admitting: Vascular Surgery

## 2010-04-25 ENCOUNTER — Encounter: Payer: Self-pay | Admitting: Internal Medicine

## 2010-05-19 ENCOUNTER — Emergency Department (HOSPITAL_COMMUNITY): Admission: EM | Admit: 2010-05-19 | Discharge: 2010-05-19 | Payer: Self-pay | Admitting: Emergency Medicine

## 2010-06-07 ENCOUNTER — Ambulatory Visit (HOSPITAL_COMMUNITY): Admission: RE | Admit: 2010-06-07 | Discharge: 2010-06-07 | Payer: Self-pay | Admitting: Internal Medicine

## 2010-06-10 ENCOUNTER — Emergency Department (HOSPITAL_COMMUNITY): Admission: EM | Admit: 2010-06-10 | Discharge: 2010-06-11 | Payer: Self-pay | Admitting: Emergency Medicine

## 2010-06-17 ENCOUNTER — Ambulatory Visit (HOSPITAL_COMMUNITY): Admission: RE | Admit: 2010-06-17 | Discharge: 2010-06-17 | Payer: Self-pay | Admitting: Internal Medicine

## 2010-07-02 ENCOUNTER — Inpatient Hospital Stay (HOSPITAL_COMMUNITY): Admission: EM | Admit: 2010-07-02 | Discharge: 2010-07-07 | Payer: Self-pay | Admitting: Emergency Medicine

## 2010-07-07 ENCOUNTER — Encounter: Payer: Self-pay | Admitting: Physician Assistant

## 2010-11-28 ENCOUNTER — Ambulatory Visit: Payer: Self-pay | Admitting: Cardiovascular Disease

## 2010-12-16 ENCOUNTER — Ambulatory Visit (HOSPITAL_COMMUNITY)
Admission: RE | Admit: 2010-12-16 | Discharge: 2010-12-16 | Payer: Self-pay | Source: Home / Self Care | Attending: Internal Medicine | Admitting: Internal Medicine

## 2011-01-12 NOTE — Progress Notes (Signed)
  Phone Note Call from Patient Call back at [facility] Phone [phone]   Summary of Call: Pt needs more refills from metoprolol medication Kalman Shan Phaamacy [id number]) Kathlen Mody PA-C Initial call taken by: Alexis Goodell,  January 04, 2010 8:52 AM  Follow-up for Phone Call        forward to provider Follow-up by: [place] Shannon,  January 06, 2010 12:49 PM    Prescriptions: METOPROLOL TARTRATE 50 MG TABS (METOPROLOL TARTRATE) Take 1 tablet by mouth two times a day  #60 x 5   Entered and Authorized by:   Richardson Dopp PA-C   Signed by:   Richardson Dopp PA-C on 01/06/2010   Method used:   Electronically to        [facility] (retail)       [address]       [place], [place]  [id number]       Ph: [phone]       Fax: [phone]   RxID:   [id number]

## 2011-01-12 NOTE — Progress Notes (Signed)
Summary: triage call from [facility]  Phone Note Other Incoming   Caller: Dorothy Spark RN [place] Summary of Call: [facility] RN Dorothy Spark called [phone]. She states she saw the pt. at home on 01/26/10 and was having extreme dizziness and was unable to sit up to eat. She was asking if there was a med pt. could take to help with this. Pt. has Medicaid and Medicare that would cover incontinence supplies and the RN advised the family had been paying out of pocket for these. They were requesting an order for the nurse to be able to order incontinence supplies and then be able to bill MCD and MCR for them. Initial call taken by: Barbra Sarks RN,  January 28, 2010 9:04 AM  Follow-up for Phone Call        spoke with daughter and she would like to know if she can have a hospital bed for her and it would be effective for the pt also... spoke with Claiborne Billings and she said pt says she had dizziness for a while but seems worst since she came home from hospital.... the dizziness is affecting her sitting up in her bed.... Claiborne Billings said pt would normal get her bearings but pt has not being doing that lately.... pt has not been able to sit up and eat .Marland Kitchen.. pt has to eat in a semi reclining position but thats causing her to choke...kelly said the insurance has a lot of qualifications please put the diagnosis on the rx for the bed and also put for a lifetime use she said anything you can put on their to help pt get the bed..you can fax med rx to [place].....  PT will go to pt house to get evaluation on a wheel chair.... [phone].... Follow-up by: [place] Shannon,  January 28, 2010 11:52 AM  Additional Follow-up for Phone Call Additional follow up Details #1::        Left a message for Claiborne Billings to contact me. Please let me talk to her when she calls back.  1.  Do I need to fill a form out to get her the hospital bed or do I write on a Rx pad.  What diagnosis are we using for the hospital bed?  2.  She has had  vertigo in the past with extensive workup.  Could give her meclizine.  Would need to be used sparingly so that it does not cause increase in delirium.  3.  Would ask HHPT to eval and treat for benign positional vertigo and see if she can be re-estab with outpatient vestib. Rehab with [name] when she is eligible to start outpatient treatment.  4.  Would ask HHRN to evaluate BP.  Check orthostatics to make sure she is not hypotensiver or orthostatic.   Additional Follow-up by: Richardson Dopp PA-C,  January 28, 2010 2:14 PM    Additional Follow-up for Phone Call Additional follow up Details #2::    Spoke to [facility] RN Pt became to difficult to handle at home and family put her in [employer] for [place]. This was the plan at d/c but the patient insisted on going home.  Follow-up by: Richardson Dopp PA-C,  January 31, 2010 5:58 PM

## 2011-01-12 NOTE — Progress Notes (Signed)
  Phone Note Call from Patient Call back at Home Phone [phone] Call back at [phone]   Summary of Call: Ms. [name], the daughter of the pt, needs a list of all the medications that she is taking from here because they wants to compare with the rest of the medication she is taking somewhere else. fax [phone] Kathlen Mody PA-c Initial call taken by: Alexis Goodell,  February 09, 2010 8:25 AM  Follow-up for Phone Call        they are faxed Follow-up by: [place] Shannon,  February 09, 2010 11:59 AM

## 2011-01-12 NOTE — Progress Notes (Signed)
Summary: TRYING TO GETA HOSP. BED  Phone Note Other Incoming   Caller: DENISE-DAUGHTER Summary of Call: [facility] pt. MS MARBLES DAUGHTER IS CHECKING UP ON IF WE CAN LOOK INTO HOW MS Stallings CAN GET A HOSPITAL BED. DENISE SAYS THAT SHE WILL PROBABLY BE DISCHARGED FROM THE HOSPITAL AND GOING INTO A REHAB. CENTER FOR 20 DAYS. Initial call taken by: Roberto Scales,  February 23, 2010 4:57 PM  Follow-up for Phone Call        Left message on answering machine for denise  to call back.... [place] Shannon  February 24, 2010 9:20 AM   Left message on answering machine for pt to call back.Marland KitchenMarland KitchenMarland KitchenThailand Shannon  February 28, 2010 9:08 AM   Left message on answering machine for pt to call back...Marland KitchenMarland KitchenThailand Shannon  March 02, 2010 11:08 AM

## 2011-01-12 NOTE — Letter (Signed)
Summary: [facility]   Imported By: Roland Earl 06/09/2009 12:14:08  _____________________________________________________________________  External Attachment:    Type:   Image     Comment:   External Document

## 2011-01-12 NOTE — Letter (Signed)
Summary: [facility]   Imported By: Roland Earl 03/31/2010 12:50:16  _____________________________________________________________________  External Attachment:    Type:   Image     Comment:   External Document

## 2011-01-12 NOTE — Letter (Signed)
Summary: REFERRAL//PHYSICAL THERAPY  REFERRAL//PHYSICAL THERAPY   Imported By: Roland Earl 01/26/2010 11:38:54  _____________________________________________________________________  External Attachment:    Type:   Image     Comment:   External Document

## 2011-01-12 NOTE — Letter (Signed)
Summary: Discharge Summary  Discharge Summary   Imported By: Roland Earl 08/18/2010 12:53:31  _____________________________________________________________________  External Attachment:    Type:   Image     Comment:   External Document

## 2011-01-12 NOTE — Progress Notes (Signed)
  Phone Note Call from Patient Call back at Home Phone [phone]   Summary of Call: Since last Wednesday, the pt have been in the Hospital for rectum bleeding and the hospital suggested the pt to contact the primary provider.  The pt fall yesterday in the hospital the hospital did MRI and x-rays just to make sure the health of the pt.  Pt received a blood transfusion this past Saturday.  Pt started feeling better after the blood transfusion.  Also, the hospital suggested the pt to get an intensive home care service. The pt is at the hospital and you can contact the daughter  The daughter of the pt states that the provider at the Hospital may think that the bp medication could cause her some dizziness so the daughter wants to know if the provider can check into. Langley Gauss [phone]) cell [phone]. Jorene Minors Initial call taken by: Alexis Goodell,  January 11, 2010 12:03 PM  Follow-up for Phone Call        forward to provider... will pull hopital notes and give them to you.. Follow-up by: [place] Shannon,  January 14, 2010 3:52 PM  Additional Follow-up for Phone Call Additional follow up Details #1::        Patient will need meds adjusted in hospital if attending physician feels she needs this. She apparently has an ongoing eval for seizures with neurol. I cannot change any of her treatment while she is an inpatient. If there is anything the attending physician needs, have them call me. Otherwise, she will need early f/u to discuss any concerns that may have been raised in the hospital. I left her a message to call back. Additional Follow-up by: Richardson Dopp PA-C,  January 14, 2010 4:18 PM    Additional Follow-up for Phone Call Additional follow up Details #2::    Left message on answering machine for pt to call back...Marland KitchenMarland KitchenThailand Shannon  January 17, 2010 1:10 PM   spoke with daughter and she would like to know if she can have a referral for a home health care nurse for more than three  hours.... daughter would like the nurse by there for her mother when mother gets out of hospital since pt can no longer able move around or able to care for her self by her self..... [place] Shannon  January 18, 2010 9:24 AM   Additional Follow-up for Phone Call Additional follow up Details #3:: Details for Additional Follow-up Action Taken: Again . . . that is something that will be arranged in the hospital.  Her attending physicians will need to order home health nursing and PT, etc while she is in the hospital.  They will take care of her getting assistance when she is discharged.  Her daughter needs to discuss this with her attending physician in the hospital and her case manager (all patients are assigned a case manager when they are admitted to take care of these sorts of things).   Left message on answering machine for pt to call back.Marland KitchenMarland KitchenMarland KitchenThailand Shannon  January 19, 2010 11:14 AM  Left message on answering machine for pt to call back....Marland KitchenMarland KitchenThailand Shannon  January 20, 2010 8:51 AM   spoke with daughter and she undersstands.... [place] Shannon  January 20, 2010 12:17 PM    Additional Follow-up by: Richardson Dopp PA-C,  January 18, 2010 1:55 PM

## 2011-01-12 NOTE — Progress Notes (Signed)
Summary: REFILL  Phone Note Call from Patient Call back at Home Phone [phone]   Reason for Call: Refill Medication Summary of Call: [name] PT. MS [name] CALLED AND SAYS THAT SHE NEEDS ANOTHER REFILL ON HER LAMICTAL. IT NEEDS TO BE CALLED INTO [facility]. Initial call taken by: Roberto Scales,  January 03, 2010 11:32 AM  Follow-up for Phone Call        will forward to [facility] for review.Marland Kitchen last rx written 04/16/09 w/5 refills.[name] CMA  January 03, 2010 11:54 AM     Prescriptions: LAMICTAL 100 MG  TABS (LAMOTRIGINE) 3 tabs by mouth two times a day  #180 x 5   Entered and Authorized by:   Richardson Dopp PA-C   Signed by:   Richardson Dopp PA-C on 01/03/2010   Method used:   Electronically to        [facility] (retail)       [address], [place]  [id number]       Ph: [phone]       Fax: [phone]   RxID:   [id number]

## 2011-01-12 NOTE — Letter (Signed)
Summary: ASSESSMENT FOR PERSONAL CARE SERVICE  ASSESSMENT FOR PERSONAL CARE SERVICE   Imported By: Roland Earl 03/18/2010 12:22:35  _____________________________________________________________________  External Attachment:    Type:   Image     Comment:   External Document

## 2011-01-12 NOTE — Consult Note (Signed)
Summary: Consultation Report  Consultation Report   Imported By: Roland Earl 03/24/2010 14:06:44  _____________________________________________________________________  External Attachment:    Type:   Image     Comment:   External Document

## 2011-01-12 NOTE — Letter (Signed)
Summary: Discharge Summary  Discharge Summary   Imported By: Roland Earl 05/19/2010 15:57:06  _____________________________________________________________________  External Attachment:    Type:   Image     Comment:   External Document

## 2011-01-12 NOTE — Letter (Signed)
Summary: PLAN OF CARE 01/26/10 TO 03/26/10  PLAN OF CARE 01/26/10 TO 03/26/10   Imported By: Adela Lank [place] 04/07/2010 15:58:03  _____________________________________________________________________  External Attachment:    Type:   Image     Comment:   External Document

## 2011-02-25 LAB — BASIC METABOLIC PANEL
CO2: 21 mEq/L (ref 19–32)
CO2: 22 mEq/L (ref 19–32)
CO2: 22 mEq/L (ref 19–32)
Calcium: 8.9 mg/dL (ref 8.4–10.5)
Calcium: 9 mg/dL (ref 8.4–10.5)
Calcium: 9 mg/dL (ref 8.4–10.5)
Creatinine, Ser: 1.12 mg/dL (ref 0.4–1.2)
GFR calc Af Amer: 57 mL/min — ABNORMAL LOW (ref >60)
GFR calc Af Amer: >60 mL/min (ref >60)
GFR calc non Af Amer: 44 mL/min — ABNORMAL LOW (ref >60)
GFR calc non Af Amer: 52 mL/min — ABNORMAL LOW (ref >60)
Glucose, Bld: 120 mg/dL — ABNORMAL HIGH (ref 70–99)
Glucose, Bld: 81 mg/dL (ref 70–99)
Glucose, Bld: 90 mg/dL (ref 70–99)
Potassium: 4.7 mEq/L (ref 3.5–5.1)
Sodium: 134 mEq/L — ABNORMAL LOW (ref 135–145)
Sodium: 135 mEq/L (ref 135–145)
Sodium: 142 mEq/L (ref 135–145)

## 2011-02-25 LAB — URINE MICROSCOPIC-ADD ON

## 2011-02-25 LAB — HEPATIC FUNCTION PANEL
ALT: 15 U/L (ref 0–35)
ALT: 20 U/L (ref 0–35)
AST: 23 U/L (ref 0–37)
Albumin: 3.4 g/dL — ABNORMAL LOW (ref 3.5–5.2)
Alkaline Phosphatase: 53 U/L (ref 39–117)
Alkaline Phosphatase: 61 U/L (ref 39–117)
Bilirubin, Direct: <0.1 mg/dL (ref 0.0–0.3)
Total Bilirubin: 0.5 mg/dL (ref 0.3–1.2)

## 2011-02-25 LAB — CBC
HCT: 30.6 % — ABNORMAL LOW (ref 36.0–46.0)
Hemoglobin: 10 g/dL — ABNORMAL LOW (ref 12.0–15.0)
Hemoglobin: 10.3 g/dL — ABNORMAL LOW (ref 12.0–15.0)
Hemoglobin: 10.4 g/dL — ABNORMAL LOW (ref 12.0–15.0)
MCH: 31.5 pg (ref 26.0–34.0)
MCHC: 34.4 g/dL (ref 30.0–36.0)
MCHC: 34.6 g/dL (ref 30.0–36.0)
Platelets: 263 10*3/uL (ref 150–400)
RBC: 3.31 MIL/uL — ABNORMAL LOW (ref 3.87–5.11)
RBC: 3.32 MIL/uL — ABNORMAL LOW (ref 3.87–5.11)
RDW: 12.9 % (ref 11.5–15.5)
WBC: 5.2 10*3/uL (ref 4.0–10.5)

## 2011-02-25 LAB — CARDIAC PANEL(CRET KIN+CKTOT+MB+TROPI)
CK, MB: 2.2 ng/mL (ref 0.3–4.0)
CK, MB: 2.2 ng/mL (ref 0.3–4.0)
CK, MB: 2.5 ng/mL (ref 0.3–4.0)
Relative Index: INVALID (ref 0.0–2.5)
Relative Index: INVALID (ref 0.0–2.5)
Total CK: 70 U/L (ref 7–177)
Troponin I: 0.03 ng/mL (ref 0.00–0.06)
Troponin I: 0.07 ng/mL — ABNORMAL HIGH (ref 0.00–0.06)

## 2011-02-25 LAB — URINE CULTURE

## 2011-02-25 LAB — URINALYSIS, ROUTINE W REFLEX MICROSCOPIC
Glucose, UA: NEGATIVE mg/dL
Hgb urine dipstick: NEGATIVE
Protein, ur: 100 mg/dL — AB
Specific Gravity, Urine: 1.019 (ref 1.005–1.030)

## 2011-02-25 LAB — POCT I-STAT, CHEM 8
Calcium, Ion: 1.29 mmol/L (ref 1.12–1.32)
Chloride: 113 mEq/L — ABNORMAL HIGH (ref 96–112)
HCT: 33 % — ABNORMAL LOW (ref 36.0–46.0)
Potassium: 3.3 mEq/L — ABNORMAL LOW (ref 3.5–5.1)
Sodium: 144 mEq/L (ref 135–145)

## 2011-02-25 LAB — POCT CARDIAC MARKERS
CKMB, poc: <1 ng/mL — ABNORMAL LOW (ref 1.0–8.0)
Troponin i, poc: <0.05 ng/mL (ref 0.00–0.09)

## 2011-02-25 LAB — CULTURE, BLOOD (ROUTINE X 2): Culture: NO GROWTH

## 2011-02-25 LAB — GLUCOSE, CAPILLARY
Glucose-Capillary: 106 mg/dL — ABNORMAL HIGH (ref 70–99)
Glucose-Capillary: 109 mg/dL — ABNORMAL HIGH (ref 70–99)
Glucose-Capillary: 128 mg/dL — ABNORMAL HIGH (ref 70–99)
Glucose-Capillary: 96 mg/dL (ref 70–99)

## 2011-02-25 LAB — DIFFERENTIAL
Lymphs Abs: 0.9 10*3/uL (ref 0.7–4.0)
Monocytes Relative: 5 % (ref 3–12)
Neutro Abs: 3.3 10*3/uL (ref 1.7–7.7)
Neutrophils Relative %: 72 % (ref 43–77)

## 2011-02-25 LAB — MRSA PCR SCREENING: MRSA by PCR: NEGATIVE

## 2011-02-26 LAB — CBC
Platelets: 283 10*3/uL (ref 150–400)
RDW: 13.2 % (ref 11.5–15.5)
WBC: 8.5 10*3/uL (ref 4.0–10.5)

## 2011-02-26 LAB — COMPREHENSIVE METABOLIC PANEL
AST: 34 U/L (ref 0–37)
Albumin: 4.4 g/dL (ref 3.5–5.2)
Alkaline Phosphatase: 75 U/L (ref 39–117)
Chloride: 107 mEq/L (ref 96–112)
Creatinine, Ser: 1.85 mg/dL — ABNORMAL HIGH (ref 0.4–1.2)
GFR calc Af Amer: 32 mL/min — ABNORMAL LOW (ref >60)
Potassium: 4.2 mEq/L (ref 3.5–5.1)
Total Bilirubin: 0.9 mg/dL (ref 0.3–1.2)

## 2011-02-26 LAB — URINALYSIS, ROUTINE W REFLEX MICROSCOPIC
Bilirubin Urine: NEGATIVE
Hgb urine dipstick: NEGATIVE
Specific Gravity, Urine: 1.015 (ref 1.005–1.030)
Urobilinogen, UA: 0.2 mg/dL (ref 0.0–1.0)

## 2011-02-26 LAB — DIFFERENTIAL
Basophils Absolute: 0 10*3/uL (ref 0.0–0.1)
Lymphocytes Relative: 31 % (ref 12–46)
Neutro Abs: 5.2 10*3/uL (ref 1.7–7.7)
Neutrophils Relative %: 61 % (ref 43–77)

## 2011-02-26 LAB — URINE MICROSCOPIC-ADD ON

## 2011-02-27 LAB — COMPREHENSIVE METABOLIC PANEL
ALT: 21 U/L (ref 0–35)
AST: 22 U/L (ref 0–37)
AST: 23 U/L (ref 0–37)
Albumin: 4 g/dL (ref 3.5–5.2)
Albumin: 4.1 g/dL (ref 3.5–5.2)
Alkaline Phosphatase: 101 U/L (ref 39–117)
Alkaline Phosphatase: 72 U/L (ref 39–117)
Alkaline Phosphatase: 78 U/L (ref 39–117)
BUN: 21 mg/dL (ref 6–23)
BUN: 18 mg/dL (ref 6–23)
CO2: 23 mEq/L (ref 19–32)
Chloride: 101 mEq/L (ref 96–112)
Chloride: 111 mEq/L (ref 96–112)
Chloride: 115 mEq/L — ABNORMAL HIGH (ref 96–112)
GFR calc Af Amer: 49 mL/min — ABNORMAL LOW (ref >60)
GFR calc non Af Amer: 34 mL/min — ABNORMAL LOW (ref >60)
Glucose, Bld: 121 mg/dL — ABNORMAL HIGH (ref 70–99)
Potassium: 4.4 mEq/L (ref 3.5–5.1)
Potassium: 4.2 mEq/L (ref 3.5–5.1)
Potassium: 4.3 mEq/L (ref 3.5–5.1)
Sodium: 138 mEq/L (ref 135–145)
Sodium: 143 mEq/L (ref 135–145)
Total Bilirubin: 0.3 mg/dL (ref 0.3–1.2)
Total Bilirubin: 0.4 mg/dL (ref 0.3–1.2)
Total Bilirubin: 0.7 mg/dL (ref 0.3–1.2)
Total Protein: 7.3 g/dL (ref 6.0–8.3)
Total Protein: 7.7 g/dL (ref 6.0–8.3)

## 2011-02-27 LAB — APTT
aPTT: 27 seconds (ref 24–37)
aPTT: 32 seconds (ref 24–37)

## 2011-02-27 LAB — DIFFERENTIAL
Basophils Absolute: 0 10*3/uL (ref 0.0–0.1)
Basophils Absolute: 0 10*3/uL (ref 0.0–0.1)
Basophils Absolute: 0 10*3/uL (ref 0.0–0.1)
Basophils Absolute: 0.1 10*3/uL (ref 0.0–0.1)
Basophils Relative: 0 % (ref 0–1)
Basophils Relative: 0 % (ref 0–1)
Basophils Relative: 1 % (ref 0–1)
Eosinophils Absolute: 0.1 10*3/uL (ref 0.0–0.7)
Eosinophils Relative: 1 % (ref 0–5)
Eosinophils Relative: 1 % (ref 0–5)
Lymphocytes Relative: 33 % (ref 12–46)
Lymphocytes Relative: 20 % (ref 12–46)
Monocytes Absolute: 0.6 10*3/uL (ref 0.1–1.0)
Monocytes Absolute: 0.5 10*3/uL (ref 0.1–1.0)
Monocytes Absolute: 0.6 10*3/uL (ref 0.1–1.0)
Monocytes Relative: 6 % (ref 3–12)
Monocytes Relative: 8 % (ref 3–12)
Neutro Abs: 5.9 10*3/uL (ref 1.7–7.7)
Neutro Abs: 4.6 10*3/uL (ref 1.7–7.7)
Neutrophils Relative %: 72 % (ref 43–77)
Neutrophils Relative %: 58 % (ref 43–77)

## 2011-02-27 LAB — TYPE AND SCREEN
Donor AG Type: NEGATIVE
PT AG Type: NEGATIVE

## 2011-02-27 LAB — CBC
HCT: 23.7 % — ABNORMAL LOW (ref 36.0–46.0)
HCT: 25.8 % — ABNORMAL LOW (ref 36.0–46.0)
HCT: 25.8 % — ABNORMAL LOW (ref 36.0–46.0)
HCT: 28.3 % — ABNORMAL LOW (ref 36.0–46.0)
HCT: 29 % — ABNORMAL LOW (ref 36.0–46.0)
HCT: 29.1 % — ABNORMAL LOW (ref 36.0–46.0)
HCT: 31.1 % — ABNORMAL LOW (ref 36.0–46.0)
HCT: 31.9 % — ABNORMAL LOW (ref 36.0–46.0)
HCT: 34.8 % — ABNORMAL LOW (ref 36.0–46.0)
HCT: 34.9 % — ABNORMAL LOW (ref 36.0–46.0)
Hemoglobin: 10 g/dL — ABNORMAL LOW (ref 12.0–15.0)
Hemoglobin: 10.5 g/dL — ABNORMAL LOW (ref 12.0–15.0)
Hemoglobin: 10.6 g/dL — ABNORMAL LOW (ref 12.0–15.0)
Hemoglobin: 10.8 g/dL — ABNORMAL LOW (ref 12.0–15.0)
Hemoglobin: 7.9 g/dL — ABNORMAL LOW (ref 12.0–15.0)
Hemoglobin: 9.8 g/dL — ABNORMAL LOW (ref 12.0–15.0)
Hemoglobin: 13.4 g/dL (ref 12.0–15.0)
Hemoglobin: 10.8 g/dL — ABNORMAL LOW (ref 12.0–15.0)
Hemoglobin: 12 g/dL (ref 12.0–15.0)
Hemoglobin: 12.3 g/dL (ref 12.0–15.0)
MCHC: 33.7 g/dL (ref 30.0–36.0)
MCHC: 34 g/dL (ref 30.0–36.0)
MCHC: 34.2 g/dL (ref 30.0–36.0)
MCHC: 34.3 g/dL (ref 30.0–36.0)
MCHC: 35 g/dL (ref 30.0–36.0)
MCV: 92.3 fL (ref 78.0–100.0)
MCV: 92.9 fL (ref 78.0–100.0)
MCV: 93.3 fL (ref 78.0–100.0)
MCV: 93.7 fL (ref 78.0–100.0)
MCV: 93.8 fL (ref 78.0–100.0)
Platelets: 180 10*3/uL (ref 150–400)
Platelets: 182 10*3/uL (ref 150–400)
Platelets: 186 10*3/uL (ref 150–400)
Platelets: 193 10*3/uL (ref 150–400)
Platelets: 204 10*3/uL (ref 150–400)
Platelets: 208 10*3/uL (ref 150–400)
Platelets: 327 10*3/uL (ref 150–400)
Platelets: 213 10*3/uL (ref 150–400)
RBC: 3.03 MIL/uL — ABNORMAL LOW (ref 3.87–5.11)
RBC: 3.09 MIL/uL — ABNORMAL LOW (ref 3.87–5.11)
RBC: 3.14 MIL/uL — ABNORMAL LOW (ref 3.87–5.11)
RBC: 3.32 MIL/uL — ABNORMAL LOW (ref 3.87–5.11)
RBC: 3.32 MIL/uL — ABNORMAL LOW (ref 3.87–5.11)
RBC: 3.36 MIL/uL — ABNORMAL LOW (ref 3.87–5.11)
RBC: 3.33 MIL/uL — ABNORMAL LOW (ref 3.87–5.11)
RBC: 3.6 MIL/uL — ABNORMAL LOW (ref 3.87–5.11)
RBC: 3.71 MIL/uL — ABNORMAL LOW (ref 3.87–5.11)
RBC: 3.71 MIL/uL — ABNORMAL LOW (ref 3.87–5.11)
RDW: 12.6 % (ref 11.5–15.5)
RDW: 12.7 % (ref 11.5–15.5)
RDW: 12.8 % (ref 11.5–15.5)
RDW: 12.8 % (ref 11.5–15.5)
RDW: 13 % (ref 11.5–15.5)
RDW: 13.4 % (ref 11.5–15.5)
RDW: 13.4 % (ref 11.5–15.5)
RDW: 13.4 % (ref 11.5–15.5)
RDW: 13.3 % (ref 11.5–15.5)
RDW: 14.6 % (ref 11.5–15.5)
RDW: 14.9 % (ref 11.5–15.5)
WBC: 7 10*3/uL (ref 4.0–10.5)
WBC: 7 10*3/uL (ref 4.0–10.5)
WBC: 7.2 10*3/uL (ref 4.0–10.5)
WBC: 7.2 10*3/uL (ref 4.0–10.5)
WBC: 7.3 10*3/uL (ref 4.0–10.5)
WBC: 7.3 10*3/uL (ref 4.0–10.5)
WBC: 7.7 10*3/uL (ref 4.0–10.5)
WBC: 7.2 10*3/uL (ref 4.0–10.5)
WBC: 8.4 10*3/uL (ref 4.0–10.5)

## 2011-02-27 LAB — BASIC METABOLIC PANEL
BUN: 8 mg/dL (ref 6–23)
CO2: 23 mEq/L (ref 19–32)
CO2: 20 mEq/L (ref 19–32)
Calcium: 7.7 mg/dL — ABNORMAL LOW (ref 8.4–10.5)
Calcium: 9.6 mg/dL (ref 8.4–10.5)
Calcium: 9.5 mg/dL (ref 8.4–10.5)
Calcium: 9.9 mg/dL (ref 8.4–10.5)
Chloride: 112 mEq/L (ref 96–112)
Creatinine, Ser: 1.15 mg/dL (ref 0.4–1.2)
Creatinine, Ser: 1.52 mg/dL — ABNORMAL HIGH (ref 0.4–1.2)
Creatinine, Ser: 1.2 mg/dL (ref 0.4–1.2)
GFR calc Af Amer: 51 mL/min — ABNORMAL LOW (ref >60)
GFR calc Af Amer: 55 mL/min — ABNORMAL LOW (ref >60)
GFR calc Af Amer: 53 mL/min — ABNORMAL LOW (ref >60)
GFR calc Af Amer: 58 mL/min — ABNORMAL LOW (ref >60)
GFR calc non Af Amer: 42 mL/min — ABNORMAL LOW (ref >60)
GFR calc non Af Amer: 33 mL/min — ABNORMAL LOW (ref >60)
GFR calc non Af Amer: 48 mL/min — ABNORMAL LOW (ref >60)
Glucose, Bld: 94 mg/dL (ref 70–99)
Glucose, Bld: 99 mg/dL (ref 70–99)
Potassium: 4.3 mEq/L (ref 3.5–5.1)
Sodium: 142 mEq/L (ref 135–145)
Sodium: 139 mEq/L (ref 135–145)
Sodium: 140 mEq/L (ref 135–145)

## 2011-02-27 LAB — HEMOGLOBIN AND HEMATOCRIT, BLOOD: Hemoglobin: 10.4 g/dL — ABNORMAL LOW (ref 12.0–15.0)

## 2011-02-27 LAB — TROPONIN I
Troponin I: 0.02 ng/mL (ref 0.00–0.06)
Troponin I: 0.02 ng/mL (ref 0.00–0.06)

## 2011-02-27 LAB — URINALYSIS, ROUTINE W REFLEX MICROSCOPIC
Glucose, UA: NEGATIVE mg/dL
Glucose, UA: NEGATIVE mg/dL
Hgb urine dipstick: NEGATIVE
Leukocytes, UA: NEGATIVE
Nitrite: NEGATIVE
Protein, ur: 100 mg/dL — AB
Specific Gravity, Urine: 1.015 (ref 1.005–1.030)
Urobilinogen, UA: 0.2 mg/dL (ref 0.0–1.0)

## 2011-02-27 LAB — LIPID PANEL
Cholesterol: 127 mg/dL (ref 0–200)
HDL: 49 mg/dL (ref >39)
LDL Cholesterol: 67 mg/dL (ref 0–99)
Total CHOL/HDL Ratio: 2.6 RATIO
Triglycerides: 56 mg/dL (ref <150)

## 2011-02-27 LAB — CROSSMATCH
Antibody Screen: POSITIVE
Donor AG Type: NEGATIVE

## 2011-02-27 LAB — CK TOTAL AND CKMB (NOT AT ARMC)
CK, MB: 2 ng/mL (ref 0.3–4.0)
CK, MB: 2.2 ng/mL (ref 0.3–4.0)
Relative Index: 1.9 (ref 0.0–2.5)
Total CK: 96 U/L (ref 7–177)

## 2011-02-27 LAB — PROTIME-INR
INR: 1.02 (ref 0.00–1.49)
Prothrombin Time: 13.2 seconds (ref 11.6–15.2)

## 2011-02-27 LAB — CREATININE, URINE, RANDOM: Creatinine, Urine: 28.4 mg/dL

## 2011-02-27 LAB — URINE MICROSCOPIC-ADD ON

## 2011-03-03 LAB — BASIC METABOLIC PANEL
BUN: 22 mg/dL (ref 6–23)
BUN: 25 mg/dL — ABNORMAL HIGH (ref 6–23)
BUN: 35 mg/dL — ABNORMAL HIGH (ref 6–23)
BUN: 4 mg/dL — ABNORMAL LOW (ref 6–23)
BUN: 8 mg/dL (ref 6–23)
CO2: 21 mEq/L (ref 19–32)
CO2: 22 mEq/L (ref 19–32)
CO2: 23 mEq/L (ref 19–32)
CO2: 23 mEq/L (ref 19–32)
Calcium: 7.3 mg/dL — ABNORMAL LOW (ref 8.4–10.5)
Calcium: 7.4 mg/dL — ABNORMAL LOW (ref 8.4–10.5)
Calcium: 7.6 mg/dL — ABNORMAL LOW (ref 8.4–10.5)
Calcium: 7.8 mg/dL — ABNORMAL LOW (ref 8.4–10.5)
Chloride: 108 mEq/L (ref 96–112)
Chloride: 109 mEq/L (ref 96–112)
Chloride: 111 mEq/L (ref 96–112)
Chloride: 115 mEq/L — ABNORMAL HIGH (ref 96–112)
Creatinine, Ser: 0.95 mg/dL (ref 0.4–1.2)
Creatinine, Ser: 1.04 mg/dL (ref 0.4–1.2)
GFR calc Af Amer: 33 mL/min — ABNORMAL LOW (ref >60)
GFR calc Af Amer: 44 mL/min — ABNORMAL LOW (ref >60)
GFR calc Af Amer: >60 mL/min (ref >60)
GFR calc Af Amer: >60 mL/min (ref >60)
GFR calc non Af Amer: 27 mL/min — ABNORMAL LOW (ref >60)
GFR calc non Af Amer: 27 mL/min — ABNORMAL LOW (ref >60)
GFR calc non Af Amer: 51 mL/min — ABNORMAL LOW (ref >60)
GFR calc non Af Amer: 57 mL/min — ABNORMAL LOW (ref >60)
Glucose, Bld: 108 mg/dL — ABNORMAL HIGH (ref 70–99)
Glucose, Bld: 124 mg/dL — ABNORMAL HIGH (ref 70–99)
Glucose, Bld: 133 mg/dL — ABNORMAL HIGH (ref 70–99)
Potassium: 3.3 mEq/L — ABNORMAL LOW (ref 3.5–5.1)
Potassium: 3.4 mEq/L — ABNORMAL LOW (ref 3.5–5.1)
Potassium: 3.5 mEq/L (ref 3.5–5.1)
Potassium: 3.6 mEq/L (ref 3.5–5.1)
Sodium: 136 mEq/L (ref 135–145)
Sodium: 139 mEq/L (ref 135–145)
Sodium: 142 mEq/L (ref 135–145)
Sodium: 144 mEq/L (ref 135–145)

## 2011-03-03 LAB — CBC
HCT: 33.4 % — ABNORMAL LOW (ref 36.0–46.0)
HCT: 36.7 % (ref 36.0–46.0)
Hemoglobin: 10.6 g/dL — ABNORMAL LOW (ref 12.0–15.0)
Hemoglobin: 11.5 g/dL — ABNORMAL LOW (ref 12.0–15.0)
Hemoglobin: 12.6 g/dL (ref 12.0–15.0)
MCHC: 33.7 g/dL (ref 30.0–36.0)
MCHC: 34.3 g/dL (ref 30.0–36.0)
MCV: 91.9 fL (ref 78.0–100.0)
MCV: 92.9 fL (ref 78.0–100.0)
Platelets: 193 10*3/uL (ref 150–400)
Platelets: 249 10*3/uL (ref 150–400)
Platelets: 263 10*3/uL (ref 150–400)
Platelets: 280 10*3/uL (ref 150–400)
RBC: 3.21 MIL/uL — ABNORMAL LOW (ref 3.87–5.11)
RBC: 3.39 MIL/uL — ABNORMAL LOW (ref 3.87–5.11)
RBC: 3.65 MIL/uL — ABNORMAL LOW (ref 3.87–5.11)
RDW: 13.6 % (ref 11.5–15.5)
RDW: 14.2 % (ref 11.5–15.5)
WBC: 10 10*3/uL (ref 4.0–10.5)
WBC: 7.9 10*3/uL (ref 4.0–10.5)
WBC: 9.2 10*3/uL (ref 4.0–10.5)
WBC: 9.4 10*3/uL (ref 4.0–10.5)

## 2011-03-03 LAB — BLOOD GAS, ARTERIAL
Acid-base deficit: 3.5 mmol/L — ABNORMAL HIGH (ref 0.0–2.0)
Bicarbonate: 23.2 mEq/L (ref 20.0–24.0)
Drawn by: 31996
FIO2: 0.24 %
O2 Saturation: 98.5 %
Patient temperature: 98.6
TCO2: 21.6 mmol/L (ref 0–100)
pCO2 arterial: 34.1 mmHg — ABNORMAL LOW (ref 35.0–45.0)
pH, Arterial: 7.397 (ref 7.350–7.400)
pH, Arterial: 7.43 — ABNORMAL HIGH (ref 7.350–7.400)
pO2, Arterial: 71.6 mmHg — ABNORMAL LOW (ref 80.0–100.0)

## 2011-03-03 LAB — URINALYSIS, MICROSCOPIC ONLY
Glucose, UA: NEGATIVE mg/dL
Ketones, ur: 15 mg/dL — AB
Leukocytes, UA: NEGATIVE
Nitrite: NEGATIVE
Specific Gravity, Urine: 1.008 (ref 1.005–1.030)
pH: 6.5 (ref 5.0–8.0)

## 2011-03-03 LAB — CK: Total CK: 91 U/L (ref 7–177)

## 2011-03-03 LAB — LAMOTRIGINE LEVEL: Lamotrigine Lvl: 24 ug/mL — ABNORMAL HIGH (ref 4.0–18.0)

## 2011-03-05 LAB — COMPREHENSIVE METABOLIC PANEL
ALT: 16 U/L (ref 0–35)
ALT: 25 U/L (ref 0–35)
ALT: 26 U/L (ref 0–35)
AST: 16 U/L (ref 0–37)
AST: 22 U/L (ref 0–37)
Alkaline Phosphatase: 64 U/L (ref 39–117)
BUN: 36 mg/dL — ABNORMAL HIGH (ref 6–23)
CO2: 18 mEq/L — ABNORMAL LOW (ref 19–32)
CO2: 19 mEq/L (ref 19–32)
Calcium: 10.2 mg/dL (ref 8.4–10.5)
Calcium: 8.8 mg/dL (ref 8.4–10.5)
Chloride: 113 mEq/L — ABNORMAL HIGH (ref 96–112)
GFR calc Af Amer: 26 mL/min — ABNORMAL LOW (ref >60)
GFR calc Af Amer: 31 mL/min — ABNORMAL LOW (ref >60)
GFR calc non Af Amer: 18 mL/min — ABNORMAL LOW (ref >60)
GFR calc non Af Amer: 21 mL/min — ABNORMAL LOW (ref >60)
Glucose, Bld: 101 mg/dL — ABNORMAL HIGH (ref 70–99)
Glucose, Bld: 111 mg/dL — ABNORMAL HIGH (ref 70–99)
Potassium: 3.9 mEq/L (ref 3.5–5.1)
Sodium: 136 mEq/L (ref 135–145)
Sodium: 139 mEq/L (ref 135–145)
Sodium: 141 mEq/L (ref 135–145)
Total Bilirubin: 0.4 mg/dL (ref 0.3–1.2)
Total Protein: 5.3 g/dL — ABNORMAL LOW (ref 6.0–8.3)

## 2011-03-05 LAB — BASIC METABOLIC PANEL
BUN: 10 mg/dL (ref 6–23)
BUN: 10 mg/dL (ref 6–23)
BUN: 14 mg/dL (ref 6–23)
BUN: 19 mg/dL (ref 6–23)
BUN: 7 mg/dL (ref 6–23)
BUN: 8 mg/dL (ref 6–23)
BUN: 9 mg/dL (ref 6–23)
CO2: 21 mEq/L (ref 19–32)
CO2: 22 mEq/L (ref 19–32)
CO2: 23 mEq/L (ref 19–32)
Calcium: 8.2 mg/dL — ABNORMAL LOW (ref 8.4–10.5)
Calcium: 8.2 mg/dL — ABNORMAL LOW (ref 8.4–10.5)
Calcium: 8.4 mg/dL (ref 8.4–10.5)
Calcium: 8.7 mg/dL (ref 8.4–10.5)
Calcium: 8.9 mg/dL (ref 8.4–10.5)
Chloride: 110 mEq/L (ref 96–112)
Chloride: 111 mEq/L (ref 96–112)
Creatinine, Ser: 1.18 mg/dL (ref 0.4–1.2)
Creatinine, Ser: 1.24 mg/dL — ABNORMAL HIGH (ref 0.4–1.2)
Creatinine, Ser: 1.25 mg/dL — ABNORMAL HIGH (ref 0.4–1.2)
Creatinine, Ser: 1.38 mg/dL — ABNORMAL HIGH (ref 0.4–1.2)
GFR calc Af Amer: 40 mL/min — ABNORMAL LOW (ref >60)
GFR calc Af Amer: 45 mL/min — ABNORMAL LOW (ref >60)
GFR calc Af Amer: 51 mL/min — ABNORMAL LOW (ref >60)
GFR calc non Af Amer: 33 mL/min — ABNORMAL LOW (ref >60)
GFR calc non Af Amer: 42 mL/min — ABNORMAL LOW (ref >60)
GFR calc non Af Amer: 42 mL/min — ABNORMAL LOW (ref >60)
GFR calc non Af Amer: 42 mL/min — ABNORMAL LOW (ref >60)
GFR calc non Af Amer: 44 mL/min — ABNORMAL LOW (ref >60)
GFR calc non Af Amer: 46 mL/min — ABNORMAL LOW (ref >60)
Glucose, Bld: 127 mg/dL — ABNORMAL HIGH (ref 70–99)
Glucose, Bld: 89 mg/dL (ref 70–99)
Glucose, Bld: 92 mg/dL (ref 70–99)
Glucose, Bld: 93 mg/dL (ref 70–99)
Potassium: 3.9 mEq/L (ref 3.5–5.1)
Potassium: 4 mEq/L (ref 3.5–5.1)
Sodium: 138 mEq/L (ref 135–145)
Sodium: 140 mEq/L (ref 135–145)

## 2011-03-05 LAB — GLUCOSE, CAPILLARY
Glucose-Capillary: 104 mg/dL — ABNORMAL HIGH (ref 70–99)
Glucose-Capillary: 107 mg/dL — ABNORMAL HIGH (ref 70–99)
Glucose-Capillary: 163 mg/dL — ABNORMAL HIGH (ref 70–99)

## 2011-03-05 LAB — BLOOD GAS, ARTERIAL
Acid-base deficit: 8.3 mmol/L — ABNORMAL HIGH (ref 0.0–2.0)
Drawn by: 145321
O2 Content: 4 L/min
pCO2 arterial: 29.8 mmHg — ABNORMAL LOW (ref 35.0–45.0)
pH, Arterial: 7.347 — ABNORMAL LOW (ref 7.350–7.400)
pO2, Arterial: 144 mmHg — ABNORMAL HIGH (ref 80.0–100.0)

## 2011-03-05 LAB — CBC
HCT: 26.2 % — ABNORMAL LOW (ref 36.0–46.0)
HCT: 27.5 % — ABNORMAL LOW (ref 36.0–46.0)
HCT: 33.9 % — ABNORMAL LOW (ref 36.0–46.0)
HCT: 39.4 % (ref 36.0–46.0)
Hemoglobin: 12.7 g/dL (ref 12.0–15.0)
Hemoglobin: 8.8 g/dL — ABNORMAL LOW (ref 12.0–15.0)
Hemoglobin: 9 g/dL — ABNORMAL LOW (ref 12.0–15.0)
MCHC: 31.8 g/dL (ref 30.0–36.0)
MCHC: 32 g/dL (ref 30.0–36.0)
MCHC: 32.1 g/dL (ref 30.0–36.0)
MCHC: 33.1 g/dL (ref 30.0–36.0)
MCV: 92.3 fL (ref 78.0–100.0)
MCV: 92.8 fL (ref 78.0–100.0)
MCV: 93.1 fL (ref 78.0–100.0)
Platelets: 245 10*3/uL (ref 150–400)
Platelets: 289 10*3/uL (ref 150–400)
Platelets: 292 10*3/uL (ref 150–400)
RBC: 2.81 MIL/uL — ABNORMAL LOW (ref 3.87–5.11)
RBC: 2.94 MIL/uL — ABNORMAL LOW (ref 3.87–5.11)
RBC: 2.96 MIL/uL — ABNORMAL LOW (ref 3.87–5.11)
RBC: 3.85 MIL/uL — ABNORMAL LOW (ref 3.87–5.11)
RBC: 4.27 MIL/uL (ref 3.87–5.11)
RDW: 13.7 % (ref 11.5–15.5)
RDW: 13.8 % (ref 11.5–15.5)
RDW: 13.8 % (ref 11.5–15.5)
RDW: 13.8 % (ref 11.5–15.5)
WBC: 10.1 10*3/uL (ref 4.0–10.5)
WBC: 14.2 10*3/uL — ABNORMAL HIGH (ref 4.0–10.5)
WBC: 8.8 10*3/uL (ref 4.0–10.5)
WBC: 9 10*3/uL (ref 4.0–10.5)

## 2011-03-05 LAB — URINALYSIS, ROUTINE W REFLEX MICROSCOPIC
Bilirubin Urine: NEGATIVE
Bilirubin Urine: NEGATIVE
Ketones, ur: NEGATIVE mg/dL
Nitrite: NEGATIVE
Nitrite: NEGATIVE
Protein, ur: NEGATIVE mg/dL
Specific Gravity, Urine: 1.005 (ref 1.005–1.030)
Specific Gravity, Urine: 1.015 (ref 1.005–1.030)
Urobilinogen, UA: 0.2 mg/dL (ref 0.0–1.0)
Urobilinogen, UA: 0.2 mg/dL (ref 0.0–1.0)

## 2011-03-05 LAB — RETICULOCYTES: RBC.: 3.02 MIL/uL — ABNORMAL LOW (ref 3.87–5.11)

## 2011-03-05 LAB — IRON AND TIBC
Iron: 93 ug/dL (ref 42–135)
Saturation Ratios: 49 % (ref 20–55)
TIBC: 188 ug/dL — ABNORMAL LOW (ref 250–470)
UIBC: 95 ug/dL

## 2011-03-05 LAB — URINE CULTURE: Culture: NO GROWTH

## 2011-03-05 LAB — PROTIME-INR: Prothrombin Time: 14.3 seconds (ref 11.6–15.2)

## 2011-03-05 LAB — URINE MICROSCOPIC-ADD ON

## 2011-03-05 LAB — HEMOGLOBIN AND HEMATOCRIT, BLOOD
HCT: 26.5 % — ABNORMAL LOW (ref 36.0–46.0)
Hemoglobin: 8.6 g/dL — ABNORMAL LOW (ref 12.0–15.0)

## 2011-03-05 LAB — AMMONIA: Ammonia: 64 umol/L — ABNORMAL HIGH (ref 11–35)

## 2011-03-05 LAB — FERRITIN: Ferritin: 124 ng/mL (ref 10–291)

## 2011-03-05 LAB — PHOSPHORUS: Phosphorus: 4.1 mg/dL (ref 2.3–4.6)

## 2011-03-05 LAB — HEPATIC FUNCTION PANEL
AST: 19 U/L (ref 0–37)
Bilirubin, Direct: 0.1 mg/dL (ref 0.0–0.3)
Indirect Bilirubin: 0.4 mg/dL (ref 0.3–0.9)

## 2011-03-05 LAB — VITAMIN B12: Vitamin B-12: 790 pg/mL (ref 211–911)

## 2011-03-05 LAB — DIFFERENTIAL
Basophils Relative: 0 % (ref 0–1)
Eosinophils Absolute: 0.1 10*3/uL (ref 0.0–0.7)
Lymphs Abs: 2 10*3/uL (ref 0.7–4.0)
Neutrophils Relative %: 75 % (ref 43–77)

## 2011-03-05 LAB — POTASSIUM: Potassium: 5.4 mEq/L — ABNORMAL HIGH (ref 3.5–5.1)

## 2011-03-05 LAB — MRSA PCR SCREENING: MRSA by PCR: NEGATIVE

## 2011-03-05 LAB — LACTIC ACID, PLASMA: Lactic Acid, Venous: 0.8 mmol/L (ref 0.5–2.2)

## 2011-03-19 LAB — BASIC METABOLIC PANEL
BUN: 19 mg/dL (ref 6–23)
CO2: 23 mEq/L (ref 19–32)
Chloride: 101 mEq/L (ref 96–112)
GFR calc non Af Amer: 35 mL/min — ABNORMAL LOW (ref >60)
Glucose, Bld: 97 mg/dL (ref 70–99)
Potassium: 4.3 mEq/L (ref 3.5–5.1)
Sodium: 132 mEq/L — ABNORMAL LOW (ref 135–145)

## 2011-03-19 LAB — URINALYSIS, ROUTINE W REFLEX MICROSCOPIC
Glucose, UA: NEGATIVE mg/dL
Hgb urine dipstick: NEGATIVE
Protein, ur: 30 mg/dL — AB
Specific Gravity, Urine: 1.007 (ref 1.005–1.030)
pH: 6.5 (ref 5.0–8.0)

## 2011-03-19 LAB — COMPREHENSIVE METABOLIC PANEL
ALT: 20 U/L (ref 0–35)
AST: 16 U/L (ref 0–37)
Albumin: 3.5 g/dL (ref 3.5–5.2)
Alkaline Phosphatase: 120 U/L — ABNORMAL HIGH (ref 39–117)
Calcium: 9.1 mg/dL (ref 8.4–10.5)
GFR calc Af Amer: 49 mL/min — ABNORMAL LOW (ref >60)
Potassium: 4.1 mEq/L (ref 3.5–5.1)
Sodium: 138 mEq/L (ref 135–145)
Total Protein: 6.4 g/dL (ref 6.0–8.3)

## 2011-03-19 LAB — BASIC METABOLIC PANEL WITH GFR
BUN: 12 mg/dL (ref 6–23)
BUN: 14 mg/dL (ref 6–23)
CO2: 21 meq/L (ref 19–32)
CO2: 23 meq/L (ref 19–32)
Calcium: 9.2 mg/dL (ref 8.4–10.5)
Calcium: 9.2 mg/dL (ref 8.4–10.5)
Chloride: 106 meq/L (ref 96–112)
Chloride: 107 meq/L (ref 96–112)
Creatinine, Ser: 1.23 mg/dL — ABNORMAL HIGH (ref 0.4–1.2)
Creatinine, Ser: 1.28 mg/dL — ABNORMAL HIGH (ref 0.4–1.2)
GFR calc non Af Amer: 40 mL/min — ABNORMAL LOW
GFR calc non Af Amer: 42 mL/min — ABNORMAL LOW
Glucose, Bld: 82 mg/dL (ref 70–99)
Glucose, Bld: 85 mg/dL (ref 70–99)
Potassium: 4 meq/L (ref 3.5–5.1)
Potassium: 4.5 meq/L (ref 3.5–5.1)
Sodium: 135 meq/L (ref 135–145)
Sodium: 136 meq/L (ref 135–145)

## 2011-03-19 LAB — CBC
HCT: 31.7 % — ABNORMAL LOW (ref 36.0–46.0)
HCT: 33.2 % — ABNORMAL LOW (ref 36.0–46.0)
HCT: 33.4 % — ABNORMAL LOW (ref 36.0–46.0)
Hemoglobin: 10.7 g/dL — ABNORMAL LOW (ref 12.0–15.0)
Hemoglobin: 11.2 g/dL — ABNORMAL LOW (ref 12.0–15.0)
Hemoglobin: 11.2 g/dL — ABNORMAL LOW (ref 12.0–15.0)
MCHC: 33.5 g/dL (ref 30.0–36.0)
MCHC: 33.7 g/dL (ref 30.0–36.0)
MCHC: 33.7 g/dL (ref 30.0–36.0)
MCV: 93.9 fL (ref 78.0–100.0)
MCV: 94.1 fL (ref 78.0–100.0)
MCV: 94.4 fL (ref 78.0–100.0)
Platelets: 245 K/uL (ref 150–400)
Platelets: 251 K/uL (ref 150–400)
Platelets: 265 10*3/uL (ref 150–400)
RBC: 3.37 MIL/uL — ABNORMAL LOW (ref 3.87–5.11)
RBC: 3.43 MIL/uL — ABNORMAL LOW (ref 3.87–5.11)
RBC: 3.54 MIL/uL — ABNORMAL LOW (ref 3.87–5.11)
RDW: 12.8 % (ref 11.5–15.5)
RDW: 12.8 % (ref 11.5–15.5)
RDW: 13 % (ref 11.5–15.5)
RDW: 13.2 % (ref 11.5–15.5)
WBC: 7.2 K/uL (ref 4.0–10.5)
WBC: 7.7 K/uL (ref 4.0–10.5)

## 2011-03-19 LAB — DIFFERENTIAL
Basophils Absolute: 0 10*3/uL (ref 0.0–0.1)
Eosinophils Absolute: 0.1 10*3/uL (ref 0.0–0.7)
Eosinophils Relative: 2 % (ref 0–5)
Lymphocytes Relative: 20 % (ref 12–46)
Monocytes Absolute: 0.5 10*3/uL (ref 0.1–1.0)

## 2011-03-19 LAB — URINE CULTURE
Colony Count: >=100000
Colony Count: NO GROWTH
Culture: NO GROWTH
Special Requests: NEGATIVE

## 2011-03-19 LAB — URINE MICROSCOPIC-ADD ON

## 2011-03-19 LAB — POCT CARDIAC MARKERS: Troponin i, poc: <0.05 ng/mL (ref 0.00–0.09)

## 2011-03-20 LAB — COMPREHENSIVE METABOLIC PANEL
ALT: 15 U/L (ref 0–35)
AST: 20 U/L (ref 0–37)
CO2: 23 mEq/L (ref 19–32)
Chloride: 100 mEq/L (ref 96–112)
Creatinine, Ser: 1.3 mg/dL — ABNORMAL HIGH (ref 0.4–1.2)
GFR calc Af Amer: 48 mL/min — ABNORMAL LOW (ref >60)
GFR calc non Af Amer: 40 mL/min — ABNORMAL LOW (ref >60)
Sodium: 132 mEq/L — ABNORMAL LOW (ref 135–145)
Total Bilirubin: 0.7 mg/dL (ref 0.3–1.2)

## 2011-03-20 LAB — URINALYSIS, ROUTINE W REFLEX MICROSCOPIC
Ketones, ur: NEGATIVE mg/dL
Nitrite: NEGATIVE
Protein, ur: 30 mg/dL — AB

## 2011-03-20 LAB — CBC
MCV: 92.9 fL (ref 78.0–100.0)
RBC: 3.22 MIL/uL — ABNORMAL LOW (ref 3.87–5.11)
WBC: 8.1 10*3/uL (ref 4.0–10.5)

## 2011-03-20 LAB — DIFFERENTIAL
Basophils Absolute: 0 10*3/uL (ref 0.0–0.1)
Basophils Relative: 0 % (ref 0–1)
Eosinophils Absolute: 0.1 10*3/uL (ref 0.0–0.7)
Eosinophils Relative: 1 % (ref 0–5)

## 2011-03-20 LAB — URINE MICROSCOPIC-ADD ON

## 2011-04-25 NOTE — Op Note (Signed)
NAMELANNIE, [name]              ACCOUNT NO.:  [id number]   MEDICAL RECORD NO.:  [id number]          PATIENT TYPE:  OIB   LOCATION:  2550                         FACILITY:  [facility]   PHYSICIAN:  Kathrin Penner, M.D.   DATE OF BIRTH:  1930-04-19   DATE OF PROCEDURE:  12/26/2007  DATE OF DISCHARGE:                               OPERATIVE REPORT   PREOPERATIVE DIAGNOSIS:  Calculous cholecystitis.   POSTOPERATIVE DIAGNOSIS:  Calculous cholecystitis.   PROCEDURE:  Laparoscopic cholecystectomy with intraoperative  cholangiogram.   SURGEON:  Kathrin Penner, MD   ASSISTANT:  Amber L. Zenia Resides, MD   ANESTHESIA:  General.   SPECIMENS:  Gallbladder with stones.   ESTIMATED BLOOD LOSS:  Is minimal.   COMPLICATIONS:  None.   DISPOSITION:  The patient returned to the PACU in excellent condition.   INDICATIONS:  [name] is a 77 year old lady presenting with right  upper quadrant abdominal pain, nausea and vomiting.  This was  precipitated by food.  She underwent imaging studies which demonstrated  cholelithiasis.  Liver function studies were mildly elevated with an  alkaline phosphatase of greater than 100.  She comes to the operating  room now, after risks and potential benefits of surgery have been fully  discussed.  All questions answered and consent obtained.   PROCEDURE IN DETAIL:  The patient was positioned supine.  Following  induction of satisfactory general anesthesia, the abdomen was prepped  and draped to be included in a sterile operative field.  Positive  identification of the patient was a carried out.  She was noted be  [name] End and operation is laparoscopic cholecystectomy with  cholangiogram.  Open laparoscopy was created by putting an incision just  slightly above the umbilicus with insertion of a Hassan cannula.  The  camera was inserted, after the pneumoperitoneum is created with carbon  dioxide at 14 mmHg pressure.  Abdomen was explored visually and the  liver edges were sharp, liver surface was smooth.  Mild amount of the  scarring to the gallbladder in the region of the ampulla.  None of the  small or large intestine visualized appeared to be abnormal.  Under  direct vision the epigastric and lateral ports were placed.  The  gallbladder was grasped and retracted cephalad.  Dissection carried down  to the region of the ampulla with isolation of the cystic duct and  cystic artery.  The cystic duct was doubly clipped and clipped  proximally and opened and a cystic duct cholangiogram carried out by  passing a Cook catheter into the abdomen and injecting one-half-strength  Hypaque into the extrahepatic biliary system under fluoroscopic  visualization.  The resulting cholangiogram showed normal caliber  radicals.  No filling defects, prompt flow of contrast into the  duodenum.  The cholangiocatheter was removed and the cystic duct was  triply clamped and then transected.  The cystic artery was doubly  clipped, clamped and transected.  The gallbladder was dissected free  from the liver bed using electrocautery and maintaining hemostasis  throughout the course of dissection.  Gallbladder was somewhat  intrahepatic but dissection proceeded without  much difficulty.  The  gallbladder was removed and was placed in an Endopouch.  Hemostasis was  assured in the liver bed with electrocautery.  The gallbladder was  retrieved through the umbilical port and forwarded for pathologic  evaluation.  Additional hemostasis was assured in the liver bed with by  placing Surgicel pack within the liver bed.  The trocars were then  removed under direct vision.  Sponge and instrument counts were verified  and wounds closed in layers; umbilical wound in two layers with zero  Vicryl and 4-0 Monocryl, epigastric and flank wounds closed with 4-0  Monocryl sutures.  All wounds reinforced with Steri-Strips.  Sterile  dressings applied.  Anesthetic reversed.  The patient  removed from the  operating room to the recovery room in stable condition having tolerated  the procedure well.      Kathrin Penner, M.D.  Electronically Signed     PB/MEDQ  D:  12/26/2007  T:  12/26/2007  Job:  [id number]

## 2011-04-25 NOTE — Discharge Summary (Signed)
[name], [name]              ACCOUNT NO.:  [id number]   MEDICAL RECORD NO.:  [id number]          PATIENT TYPE:  INP   LOCATION:  1436                         [facility]ACILITY[facility]   PHYSICIAN:  Hind I Elsaid, MD      DATE OF BIRTH:  09-May-1930   DATE OF ADMISSION:  06/14/2009  DATE OF DISCHARGE:  06/17/2009                               DISCHARGE SUMMARY   DISCHARGE DIAGNOSES:  1. Hypertensive urgency.  2. Left leg weakness, felt to be secondary to #1 and chronic      arthritis.  3. Acute renal insufficiency, on chronic.  Baseline creatinine 1.2.  4. Escherichia coli urinary tract infection.  5. History of coronary artery disease.  6. History of seizure disorder.  7. History of severe ischemic colitis in 2008.  8. History of acute renal failure in the past.  9. History of anemia.   DISCHARGE MEDICATIONS:  1. Amlodipine 10 mg daily.  2. Aspirin 81 mg coated p.o. daily.  3. Benazepril 40 mg p.o. daily.  4. cardura 8 mg p.o. q.12h.  5. Hydrochlorothiazide 25 mg daily.  6. Labetalol 600 mg q.12h.  7. Lamictal 300 mg p.o. q.12h.  8. Oxybutynin 5 mg every 12 hour.  9. Zetia 10 mg p.o. daily.  10.Cipro 250 mg p.o. q.12h. for 3 days.   PROCEDURES:  1. Chest x-ray - stable cardiomegaly, no active lung disease.  2. CT head without contrast - negative for acute hemorrhage.  3. MRI of the lower extremity which did show tibialis fossa      tendinopathy with thickening of the spring ligament __________      insufficiency of the anterior talofibular ligament.  4. Advanced degenerative change.  5. MRI of the brain with no acute infarction.  Questionable cervical      stenosis.  6. MRI of the cervical spine.  Impression was degenerative spondylosis      of the cervical spine.  No stenosis that one would expect to result      in cord symptoms.  There is foramen narrowing at C3-4, right more      than left, and C4-C5 bilaterally, and C5-C6, right more than left.   HISTORY OF PRESENT  ILLNESS:  This is a 77 year old African-American  female with history of seizures, hypertension, coronary artery disease.  1. The patient admitted with chronic history of gait disturbance for      several months, chronic ankle, mainly left ankle swelling status      post a recent MRI in June with results as above.  Admitted with      some weakness of her legs, mainly the left.  Presented to the      emergency room for evaluation and was found to have a blood      pressure of 201/97, heart rate of 59, respiratory rate of 20,      temperature 98.  Possibility of transient ischemic attack versus      stroke.  The patient admitted to the neurologic floor, and CT scan      of the brain was negative for acute  hemorrhage or any acute      intracranial event.  Carotid duplex negative for ICA stenosis.  A 2-      D echocardiogram did show wall thickening was increased, mild left      ventricular hypertension.  Systolic function was normal and      ejection fraction 65-70%.  The outflow tracts show mild hypertrophy      and moderate obstruction, so apparently leg weakness most probably      could be chronic from her gait disturbance and arthritis, or      related to hypertensive urgency, but during the hospital stay, the      patient's symptoms resolved, and the patient was evaluated by      physical therapy and occupational therapy.  The patient will remain      on aspirin, and the patient's blood pressure medications resumed.  2. Acute renal insufficiency.  The patient presented with a creatinine      of 1.45; with IV fluids, creatinine today 1.2, and we suggested the      patient to follow up with her primary care physician as an      outpatient.  We did not change any of her hypertensive medications.  3. Urinary tract infection.  The patient was found to have E. coli in      the urine, and Cipro was started.   During hospital stay, the patient significantly improved, and the  patient walked  with a walker.  Physical therapy did evaluate the patient  during hospital stay.  At this time, it is felt the patient is medically  stable to be discharged.      Hind Franco Collet, MD  Electronically Signed     HIE/MEDQ  D:  06/17/2009  T:  06/17/2009  Job:  [id number]

## 2011-04-25 NOTE — H&P (Signed)
Selena, Gonzalez              ACCOUNT NO.:  [id number]   MEDICAL RECORD NO.:  [id number]          PATIENT TYPE:  INP   LOCATION:  [id number]                         FACILITY:  [facility]   PHYSICIAN:  Adele Barthel, MD    DATE OF BIRTH:  September 15, 1930   DATE OF ADMISSION:  06/14/2009  DATE OF DISCHARGE:                              HISTORY & PHYSICAL   PRIMARY CARE PHYSICIAN:  [facility].   CHIEF COMPLAINT:  Leg weakness.   HISTORY OF PRESENT ILLNESS:  Ms. [name] is a very pleasant 77-  year-old lady with a history of seizure disorder, coronary artery  disease and hypertension who was in her usual state of health until  yesterday at around 8 o'clock when she experienced the left leg to get  weak and make her gait wobbly.  She does have a gait disturbance for  several months according to her.  However, this was something that she  has never experienced.  She thought the weakness would go away by  itself.  However, although it has improved significantly, she still  feels her left leg is significantly weaker.  Therefore she presented to  the emergency room.  In the ER she was also found to have hypertensive  urgency, mild renal insufficiency.  Therefore, [facility] hospitalists were  contacted for further evaluation and management.   REVIEW OF SYSTEMS:  As mentioned in HPI, otherwise negative.   PAST MEDICAL HISTORY:  Is significant for:  1. History of CAD.  2. History of hypertension.  3. History of seizure disorder.  4. History of severe ischemic colitis.  5,  History of pneumonia.  1. History of the acute renal failure in the past.  2. History of anemia.  3. History of hyponatremia.  4. History of hypertension.  5. Questionable history of atrial fibrillation.   PAST SURGICAL HISTORY:  Status post laparoscopic cholecystectomy.   HOME MEDICATIONS:  Include:  1. Amlodipine 10 mg p.o. daily.  2. Aspirin 81 mg enteric-coated p.o. daily.  3. Benazepril 40 mg p.c. daily.  4.  Doxazosin 8 mg p.o. every 12 hours.  5. HCTZ 25 mg p.o. daily.  6. Labetalol 600 mg p.o. every 12 hours.  7. Lamictal 300 mg p.o. every 12 hours.  8. Oxybutynin 5 mg p.o. every 12 hours.  9. Zetia 10 mg p.o. daily.   FAMILY HISTORY:  Noncontributory.   SOCIAL HISTORY:  The patient lives with her son.  She denies tobacco,  alcohol or IV drug abuse.   ALLERGIES:  No known drug allergies.   EXAMINATION:  VITAL SIGNS:  Blood pressure 201/93, heart rate 59,  respiratory rate 20, temperature 98.2.  GENERAL EXAMINATION:  No acute cardiorespiratory distress.  HEENT:  No ear, nose discharge.  EOM intact.  LUNGS:  Normal breath sounds.  There are no crepitations.  CARDIOVASCULAR:  S1, S2 normal.  No gallop or murmur heard.  GI:  Soft, nontender.  No organomegaly.  EXTREMITIES:  No pedal edema.  PSYCH:  Oriented x3.  CNS:  Speech intact.  Follows commands.  Mild left lower extremity  weakness present, mayb4+/5.  Rest  of the neuro examination is intact.   LABORATORY DATA:  WBC 7.2, hemoglobin 11.2, hematocrit 33.2, platelet  count 265.  Sodium 132, potassium 4.3, chloride 101, bicarbonate 23, BUN  19, creatinine 1.45, glucose 97.  UA:  Moderate WBCs, many bacteria.   ASSESSMENT/PLAN:  1. Questionable episode of transient ischemic attack versus stroke.      CT scan of the brain was performed which was negative for acute      hemorrhage or any acute intracranial event.  We will perform an MRI      to rule out stroke.  Carotid duplex,  echocardiogram and admit the      patient under telemetry.  I will also obtain a 12-lead      electrocardiogram to evaluate the rhythm at this time.  Continue      aspirin for now.  However, if the patient does have a positive MRI      Aggrenox can be substituted.  Neuro checks will also be performed.      Keep the target blood pressure more than 160/90 at this time but      less than 200.  Gentle intravenous hydration is given.  2. Hypertensive urgency.   Continue home medication.  Avoid sudden drop      in blood pressure.  As needed hydralazine is also requested.  3. Acute renal insufficiency.  Gentle intravenous hydration is given.  4. Urinary tract infection.  Urine culture and sensitivities      requested.  Intravenous ceftriaxone is started.  5. Prophylaxis for deep vein thrombosis, gastrointestinal given.  6. Code status.  The patient is full code.  Total time spent in admission of this patient around 1 hour 15 minutes.      Adele Barthel, MD  Electronically Signed     NP/MEDQ  D:  06/14/2009  T:  06/14/2009  Job:  [id number]   cc:   Myriam Jacobson  Fax: [phone]

## 2011-04-28 NOTE — H&P (Signed)
[name], [name]              ACCOUNT NO.:  [id number]   MEDICAL RECORD NO.:  [id number]          PATIENT TYPE:  INP   LOCATION:  [id number]                         FACILITY:  [facility]   PHYSICIAN:  Jonne Ply, MD   DATE OF BIRTH:  12/21/1929   DATE OF ADMISSION:  02/23/2006  DATE OF DISCHARGE:  02/24/2006                                HISTORY & PHYSICAL   REASON FOR ADMISSION:  Acute appendicitis.   ADMISSION DIAGNOSIS:  Acute appendicitis.   HISTORY OF PRESENT ILLNESS:  The patient is a very pleasant 77 year old  black female who was seen in the [facility] Emergency Room with a 2-day history of  right lower quadrant abdomional pain. She underwent CT scan which showed  localized appendicitis in the right lower quadrant without evidence of  abscess or perforation.  The patient has felt nausea and one episode of  vomiting 18 hours ago.  Her white count was elevated at 13,000.  I was then  called for further evaluation and treatment.   PAST MEDICAL HISTORY:  Significant for:  1.  Knee replacement x2.  2.  Hip replacement.  3.  Cataract surgery.  4.  History of carpal tunnel syndrome.  5.  Left breast surgery.  6.  Coronary artery disease with angioplasty.   REVIEW OF SYSTEMS:  Positive, as above, for nausea and right lower quadrant  pain.   PHYSICAL EXAMINATION:  VITAL SIGNS:  Temperature 97.4, down from 99.7.  Her  heart rate is 63, down from 86.  Respiratory rate 18, blood pressure 170/87.  Height 5 feet 11 inches.  Weight 186.  HEENT: Benign.  Normocephalic and atraumatic.  Pupils equal, round, and  reactive to light.  Apparent cataract in the left eye.  NECK:  Supple and soft without thyromegaly or cervical adenopathy.  It is  not checked for bruits at this time.  LUNGS: Clear to auscultation and percussion x2.  HEART: Regular rate and rhythm without murmurs, rubs, or gallops.  ABDOMEN: Soft but tender in the right lower quadrant with distinct localized  peritonitis.  There  are abdominal hernia defects.  EXTREMITIES:  On my exam show no clubbing, cyanosis, or edema.   DATA ORDERED AND REVIEWED:  CT scan shows acute appendicitis.   White count 13,000.   IMPRESSION:  Acute appendicitis.   PLAN:  1.  Admission.  2.  IV antibiotics.  3.  Laparoscopic or possibly open appendectomy.   I discussed all the risks and possible complications with the patient, and  she agrees.  I also talked with her daughter, and she wishes to proceed.      Jonne Ply, MD  Electronically Signed     KRE/MEDQ  D:  02/23/2006  T:  02/25/2006  Job:  [id number]

## 2011-04-28 NOTE — Discharge Summary (Signed)
NAMEELLISHA, OLLIVER NO.:  [id number]   MEDICAL RECORD NO.:  [id number]          PATIENT TYPE:  INP   LOCATION:  [id number]                         FACILITY:  [facility]   PHYSICIAN:  Anderson [name], M.D.    DATE OF BIRTH:  12/07/1930   DATE OF ADMISSION:  12/18/2006  DATE OF DISCHARGE:  12/21/2006                               DISCHARGE SUMMARY   DISCHARGE DIAGNOSIS:  Right shoulder rotator cuff tear.   SECONDARY DIAGNOSES:  1. History of multiple joint replacements.  2. Hypertension.   OPERATIONS/PROCEDURES:  Right shoulder labrum repair and rotator cuff  repair.  Repair performed December 18, 2006.   HOSPITAL COURSE:  Nayani Pinneo is a 77 year old patient with end-stage  right shoulder pain.  She presents with right shoulder pain and rotator  cuff tear.  She underwent arthroscopy, subacromial decompression,  debridement, and rotator cuff repair December 18, 2006.  She tolerated the  procedure well without immediate complications.  She was slowly  mobilized.  Therapy was started to begin shoulder range of motion.   The dressing was changed on postoperative day number 2.  She otherwise  had an unremarkable recovery.  The pain was controlled on oral pain  medications.   She is discharged home in good condition on December 21, 2006.  She will  continue with pendulum exercises and will come back to see me in one  week for suture removal.   DISCHARGE MEDICATIONS:  Percocet 1 to 2 q.3-4h. p.r.n. pain as well as  phenobarbital, Detrol, labetalol, benazepril, Zetia, Lamictal, Sular,  and doxazosin.  She will stay in the sling except when she is doing her  range of motion exercises.      Anderson [name], M.D.  Electronically Signed     GSD/MEDQ  D:  01/31/2007  T:  01/31/2007  Job:  [id number]

## 2011-04-28 NOTE — Discharge Summary (Signed)
NAMEARLESHA, SPELMAN              ACCOUNT NO.:  [id number]   MEDICAL RECORD NO.:  [id number]          PATIENT TYPE:  INP   LOCATION:  [id number]                         FACILITY:  [facility]   PHYSICIAN:  Aquilla Hacker, M.D. DATE OF BIRTH:  1930-04-09   DATE OF ADMISSION:  01/18/2007  DATE OF DISCHARGE:  01/25/2007                               DISCHARGE SUMMARY   FINAL DIAGNOSES:  1. Severe ischemic colitis.  2. Pneumonia.  3. Acute renal failure.  4. Anemia; acute blood loss.  5. Hyponatremia.   PROCEDURES.:  1. Colonoscopy with multiple cold biopsies, completed on      February12,2008 by Dr. Juanita Craver.  2. Acute abdominal x-ray completed February8,2008.  3. CT scan of the abdomen and pelvis completed February8,2008.  4. CT of the chest completed February12,2000.  5. Portable chest x-ray completed February12,2008.   HISTORY OF PRESENT ILLNESS:  Ms. Shay is a 77 year old female who  arrived with a chief complaint of abdominal pain.  She indicated that  earlier in the day she had right shoulder surgery; however, later after  she left the facility she developed abdominal pain.  She also complained  of one episode of emesis.   PAST MEDICAL HISTORY:  Please see that dictated by Dr. Juanito Doom.   HOSPITAL COURSE:  #1 - SEVERE ISCHEMIC COLITIS.  An acute abdominal series of x-rays was  completed on February8,2008.  It revealed rectosigmoid stool, suggesting  mild constipation; no free air or obstruction.  The patient also had a  CT scan of her abdomen and pelvis done on Februar 8,2008.  This revealed  colitis involving the splenic flexure and descending colon.  A  significant amount of stool was seen throughout the sigmoid colon;  findings consistent with colitis.  The patient was started on empiric  ciprofloxacin and metronidazole.  Gastroenterology was consulted.  On  February12,2008 Dr. Juanita Craver performed a colonoscopy on the patient.  She discovered severe ulcerative  changes, consistent with ischemic  colitis.  Biopsies were obtained; they revealed necrotic fragments of  colonic mucosa with associated marked acute and chronic inflammation.   #2 - PNEUMONIA.  On January 22, 2007 the patient began to develop some  shortness of breath.  A portable chest x-ray was done on February12,  which revealed cardiomegaly and low lung volumes.  Right base  atelectasis was seen.  A CT scan of the chest was completed with  angiographic contrast on February12,2008; it was negative for PE. Right  and left lower lobe air space opacities, compatible with a pneumonia  were seen.  Small pleural effusions were also noted.  Zosyn was started  empirically on the patient.  Her shortness of breath resolved by the  date of discharge.   #3 - ACUTE RENAL FAILURE.  The patient's BUN and creatinine were 18 and  1.3 respectively at the time of admission.  It appeared that the patient  may or may not have had a mild case of renal insufficiency.  This  improved over the course of her hospitalization.   #4 - ACUTE BLOOD LOSS ANEMIA.  The patient's hemoglobin  was noted to  have declined from 12.8 down to a low of 9.4 over the course of her  hospitalization.  This may  have been related to the severe ischemic  colitis.  Her hemoglobin was monitored.  By the date of discharge the  hemoglobin began to trend upwards, and improved to 10.4 by discharge.   #5 - HYPONATREMIA.  The patient's sodium level was noted to be mildly  low at 130 at the time of her admission.  By the date of discharge the  patient's sodium level had improved up to 137.  She was otherwise  asymptomatic.   #7 - HYPERTENSION.  The patient's blood pressure was not ideally  controlled.  Attempts were made to adjust her medications to better  control her blood pressure.   By the date of discharge the patient indicated that she felt good.  She  had no complaints.  Her temperature was 98.5, heart rate 64,  respirations  20, blood pressure 135/63, O2 saturations 100% on 2 liters  O2.   LABS:  White blood cell count was 6.8, hemoglobin 10.4, hematocrit 30.2,  platelets 301.  Sodium 137, potassium 3.5, chloride 104, CO2 23, BUN 2,  creatinine 1.08, glucose 86, calcium questionable.   The decision was made to discharge the patient from the hospital.   DISCHARGE MEDICATIONS:  The patient was discharged home on the following  medications:  1. Detrol 4 mg p.o. daily  2. Sular 40 mg p.o. daily.  3. Protonix 40 mg daily.  4. Phenobarbital 130 mg p.o. daily.  5. Labetalol 300 mg p.o. b.i.d.  6. Lamictal 300 mg b.i.d.  7. Metronidazole 500 mg b.i.d.  8. Benazepril 40 mg daily.  9. Ciprofloxacin 250 mg b.i.d.  10.Doxazosin 8 mg daily.  11.Zetia 10 mg daily.      Aquilla Hacker, M.D.  Electronically Signed     OR/MEDQ  D:  04/02/2007  T:  04/02/2007  Job:  [id number]

## 2011-04-28 NOTE — Op Note (Signed)
NAMEBRIGITTA, MANNERS              ACCOUNT NO.:  [id number]   MEDICAL RECORD NO.:  [id number]            PATIENT TYPE:   LOCATION:                                 FACILITY:   PHYSICIAN:  Jonne Ply, MD        DATE OF BIRTH:   DATE OF PROCEDURE:  02/23/2006  DATE OF DISCHARGE:                                 OPERATIVE REPORT   PREOPERATIVE DIAGNOSIS:  Acute appendicitis.   POSTOPERATIVE DIAGNOSIS:  Acute appendicitis.   PROCEDURE:  Laparoscopic appendectomy.   SURGEON:  Jonne Ply, MD   ANESTHESIA:  General.   DESCRIPTION:  The patient was taken operating room and placed in the supine  position after adequate general anesthesia was induced, using endotracheal  tube, the patient was placed in the supine position. The abdomen was prepped  and draped in a normal sterile fashion; and a Foley catheter was placed.  Using the OptiVu with an 11-mm trocar in the left upper quadrant, under  direct vision, peritoneal access was obtained. Pneumoperitoneum was  obtained. Additional 5 and 12-mm trocars were placed. The appendix was  visualized and appeared suppurative, but there was no evidence of  perforation. The __________ were retracted anteriorly and the mesoappendix  was taken down with the harmonic scalpel. The base of the appendix was  transected using a GIA stapling device white load. Staple line looked good.  The right lower quadrant was irrigated. Pneumoperitoneum was released. Skin  incisions were closed with subcuticular 4-0 Monocryl. Steri-Strips and  sterile dressings were applied. The patient tolerated the procedure well and  went to PACU in good condition.      Jonne Ply, MD  Electronically Signed     KRE/MEDQ  D:  02/24/2006  T:  02/26/2006  Job:  [id number]

## 2011-04-28 NOTE — H&P (Signed)
NAMELINDELL, SEMMEL              ACCOUNT NO.:  [id number]   MEDICAL RECORD NO.:  [id number]          PATIENT TYPE:  EMS   LOCATION:  ED                           [facility]ACILITY[facility]  [facility]   PHYSICIAN:  Juanito Doom, MD       DATE OF BIRTH:  November 25, 1930   DATE OF ADMISSION:  01/18/2007  DATE OF DISCHARGE:                              HISTORY & PHYSICAL   PRESENTING COMPLAINT:  Cough and wheezing.   HISTORY OF PRESENT ILLNESS:  DICTATION ENDS HERE.      Juanito Doom, MD  Electronically Signed     GA/MEDQ  D:  01/18/2007  T:  01/18/2007  Job:  [id number]

## 2011-04-28 NOTE — Op Note (Signed)
[place]  Patient:    [name], [name] Visit Number: [id number] MRN: [id number]          Service Type: SUR Location: [facility] 2550 06 Attending Physician:  Marlon Pel Dictated by:   Hedda Slade, M.D. Proc. Date: 04/10/02 Admit Date:  04/10/2002                             Operative Report  PREOPERATIVE DIAGNOSIS:  Osteoarthritis with valgus deformity of right knee.  POSTOPERATIVE DIAGNOSIS:  Osteoarthritis with valgus deformity of right knee.  OPERATIVE PROCEDURE:  Cemented Osteonics right total knee arthroplasty.  SURGEON:  Hedda Slade, M.D.  ASSISTANT:  Meredith Pel, M.D.  ANESTHESIA:  General.  DESCRIPTION OF PROCEDURE:  An IV was started and the patient was given prophylactic IV Ancef, then a general anesthetic was given, a Foley catheter was put in place, a proximal pneumatic tourniquet was placed, then isolated with the U-drape.  From the edges of the drape to the toes, she was prepped with DuraPrep and draped in the usual fashion; this included the use of two Vi-Drapes.  The tibial crest had been marked with an ink pen, tibial tubercle was marked and the incision and crosshatches made prior to application of the Boron.  The extremity was exsanguinated by elevation and the use of an Esmarch, then proximal pneumatic tourniquet was elevated to 250 mmHg.  A vertical incision was made, centered on the patella, and carried down to the quadriceps tendon proximally, the prepatellar bursa at the patella and along the medial edge of the patellar tendon.  The soft tissues were elevated, exposing the medial retinaculum, then the knee was opened by incising through near the edge of the quadriceps tendon, passing down to medial side of the patellar tendon; a moderate amount of yellow fluid was released.  The tibial plateau medially was exposed sharply back to the posterior corner.  The fat pad was excised and the lateral  tibial plateau edge was exposed; this required a meniscectomy medially and laterally.  We removed osteophytes from the edge of the femur, then resected the femur 12 mm in 5 degrees of valgus with an intramedullary guide.  Then using the adjustable guide, we measured the size of the femur and elected on size 5 and we set the external rotation to go along the transepicondylar line and in this case, with some deficiency laterally, we had 6 degrees of external rotation.  The chamfer cuts of the femur were then carried out using the chamfer guide. Resection anteriorly came out exactly equal to the anterior cortex and did remove some of the posterior condyles.  Osteophytes posterior to this cut were removed using osteotome.  There were a couple of loose bodies found and removed.  Femoral notch was made using the guide and that was impacted.  Using an intramedullary guide, the tibia was resected 10 mm down from the lateral side of the tibial, which was almost parallel to the medial side; the lateral side was down to subchondral bone.  This was cut with 5 degree posterior tilt.  Following this, we put in trial size 5 femoral component, trial size 5 tibial tray with a 12-mm bearing insert, then ran the knee in flexion and extension.  Knee was nice and stable in extension, did not hyperextend and would fully flex; that helped determine the rotation of the tibial tray and that was marked,  then using these marks as well as following it closely, we elected on size #5 tibial tray and used the tower to impact a slot for the tibial tray fins.  Attention was then directed to the patella, the edges of which were exposed. The patella then measured 23 mm in thickness.  I did a saw cut transversely, beginning laterally, going medially, then fitted a size #5 guide, which seemed to fit well.  The peg holes were made and the trial patella then fitted.  This then made a sandwich total of 22-mm thick, compared  to the 23 prior to resection.  The lateral patellofemoral ligament was divided, soft tissues put aside and I was able then to have the patella stand perpendicular to the trochlear groove.  The trial patella tracked very nicely.  At this point then, we were ready for cementing the components.  The area was copiously irrigated with pulsatile lavage.  The tibial tray was cemented in place.  All excess cement was removed.  The femoral component was cemented in place, all excess cement removed, then we inserted the permanent size 5 12-mm thick tibial bearing insert and allowed the knee to fall into extension, following which the resurfacing patellar component was cemented in place and all excess cement removed.  After all the cement had set all the way around, we then lavaged the area and looked for bits of free cement or debris and all of this was cleared out, then the proximal pneumatic tourniquet let down, venous bleeders were bovie-cauterized, the area was again irrigated, retinaculum was closed using figure-of-eight sutures of #1 Vicryl, more subcutaneous tissues were closed with 2-0 Vicryl and skin edges held in apposition with metal staples.  A suction Hemovac drain was placed superficial to the retinaculum and it was brought out superiorly.  Extremity was dressed with Xeroform, 4 x 4s, ABD, Kerlix and an Ace wrap, following which the extremity was placed in a knee immobilizer and patient returned to recovery room in in good condition.  Estimated blood loss was 300 cc.  The patient was stable throughout.  The Osteonics [place] knee components were size #5 femoral component, #5 patellar resurfacing component, #5 tibial tray and #5 12-mm thick tibial bearing insert.Dictated by:   Hedda Slade, M.D.  Attending Physician:  Marlon Pel DD:  04/10/02 TD:  04/10/02 Job: [id number]

## 2011-04-28 NOTE — H&P (Signed)
NAMESHAKEDA, KEINATH              ACCOUNT NO.:  [id number]   MEDICAL RECORD NO.:  [id number]          PATIENT TYPE:  EMS   LOCATION:  ED                           FACILITY:  [facility]   PHYSICIAN:  Juanito Doom, MD       DATE OF BIRTH:  November 12, 1930   DATE OF ADMISSION:  01/18/2007  DATE OF DISCHARGE:                              HISTORY & PHYSICAL   PRESENTING COMPLAINT:  Abdominal pain.   HISTORY OF PRESENT ILLNESS:  This is a 77 year old African-American  female patient who came into the hospital because of abdominal pain.  She vomited once in the morning.  Earlier today, she was at the  hospital.  She had surgery on her right shoulder because of pain.  She  has been having refractory pain on the right shoulder.  She had  diagnostic and operative arthroscopy on the right shoulder.  Patient  tolerated the procedure well without immediate complications.  The  patient was later brought to the ER today because of abdominal pain.  She told me that she moved her bowels this morning and that the pain  started today.  The pain is slightly a 6/10.  It was relieved by  morphine.  She denies any headaches.  No nausea, no vomiting.  No  diarrhea at this time.  No cough.  No shortness of breath.  She denies  any dysuria.  She denies any feet swelling.   PAST MEDICAL HISTORY:  1. History of seizure disorder.  2. History of high blood pressure.  3. History of CAD with angioplasty.   History of past surgeries include the right shoulder surgeries done  today, history of hip and knee replacement, history of cataract surgery.   FAMILY HISTORY:  Noncontributory.   SOCIAL HISTORY:  Her son lives with her.  She does not smoke cigarettes.  She does not use any alcohol.   She has no known drug allergies.   CURRENT MEDICATIONS:  I am not sure of the current medications that she  is taking as well as the dosage.   FAMILY HISTORY:  Noncontributory.   She has no known drug allergies.   PHYSICAL  EXAMINATION:  VITAL SIGNS:  Temperature 97.7, pulse rate 66,  respirations 20, blood pressure 171/80.  GENERAL:  Patient was sleepy but arousable.  Was complaining of cold.  HEAD/NECK:  She has no jaundice.  Her neck is supple.  Her mucous  membranes are moist.  CHEST:  Moves with respirations.  Auscultation shows clear breath  sounds.  No wheezing.  No crackles.  CARDIOVASCULAR:  Normal heart sounds.  No murmur.  No gallop.  ABDOMEN:  Full, soft.  No tenderness.  No masses palpable.  Patient has  normal bowel sounds.  EXTREMITIES:  No edema.  CENTRAL NERVOUS SYSTEM:  Patient is arousable.  She is oriented.  She  knows her name.  She knows that she is in [place]  is 4/4 in all limbs but is limited because of pain.   Lab work showed WBC 10.9, hemoglobin 12, hematocrit 38, platelets 292.  Sodium 130,  potassium 4.4, chloride 94, bicarb 26, BUN 18, creatinine  1.3, glucose 101.  Lipase 70.  Calcium 10.1.  UA shows 3-6 WBCs.  Lipase  level was 17.   EKG shows left bundle branch block, which is not new.   CT of the abdomen shows colitis and also impacted stool.   ASSESSMENT:  1. Colitis.  2. Constipation.  3. Shoulder pain, status post surgery today.  4. History of high blood pressure.  5. History of seizures.  I am not sure of the last time that she had a      seizure.   My plan is to admit the patient to the telemetry floor.  I am going to  continue IV metronidazole 500 mg q.12h. and ciprofloxacin 400 mg q.12h.  I will give her Fleet enema x1 dose now, and I will continue Senokot 1  p.o. nightly for constipation.  I will continue her current home  medications.  There is order for the nurse to call  the patient family  to comfirm her current  home medications.      Juanito Doom, MD  Electronically Signed     GA/MEDQ  D:  01/18/2007  T:  01/18/2007  Job:  [id number]

## 2011-04-28 NOTE — H&P (Signed)
[name], [name]              ACCOUNT NO.:  [id number]   MEDICAL RECORD NO.:  [id number]          PATIENT TYPE:  INP   LOCATION:  1307                         FACILITY:  [facility]   PHYSICIAN:  Jonne Ply, MD   DATE OF BIRTH:  10/06/1930   DATE OF ADMISSION:  02/23/2006  DATE OF DISCHARGE:  02/24/2006                                HISTORY & PHYSICAL   DIAGNOSIS:  Acute appendicitis.   HISTORY OF PRESENT ILLNESS:  The patient is a 77 year old black female with  a 3-day history of periumbilical and then right lower quadrant abdominal  pain. She presented to the [facility] emergency room and underwent CT scan  of the abdomen and pelvis which showed acute appendicitis. The patient does  admit to some nausea.   REVIEW OF SYSTEMS:  Significant for nausea and vomiting, otherwise  noncontributory.   PAST MEDICAL HISTORY:  Significant for mild hypertension.   PAST SURGICAL HISTORY:  Significant only for arthroscopies.   PHYSICAL EXAMINATION:  GENERAL:  She is an age-appropriate black female in  minimal distress.  VITAL SIGNS:  Heart rate is 72, temperature was 99.6, blood pressure was  123/70.  HEENT:  Benign. Normocephalic, atraumatic. Pupils are equal, round, reactive  to light.  NECK:  Supple and soft without thyromegaly or cervical adenopathy.  LUNGS:  Clear to auscultation and percussion x2.  HEART:  Regular rate and rhythm without murmurs, rubs, or gallops.  ABDOMEN:  Soft but tender in the right lower quadrant with positive rebound.  She has localized peritonitis to my exam in the right lower quadrant.  EXTREMITIES:  Show no clubbing, cyanosis, or edema.   Data ordered reviewed. I reviewed her CAT scan which is consistent with  acute appendicitis.   IMPRESSION:  Acute appendicitis.   PLAN:  Laparoscopic appendectomy and IV antibiotics.      Jonne Ply, MD  Electronically Signed     KRE/MEDQ  D:  02/24/2006  T:  02/26/2006  Job:  [id number]

## 2011-04-28 NOTE — Op Note (Signed)
NAMEKERI, DUEL                        ACCOUNT NO.:  [id number]   MEDICAL RECORD NO.:  [id number]                   PATIENT TYPE:  INP   LOCATION:  5006                                 FACILITY:  [place]   PHYSICIAN:  Anderson [name], M.D.                 DATE OF BIRTH:  July 12, 1930   DATE OF PROCEDURE:  12/29/2003  DATE OF DISCHARGE:                                 OPERATIVE REPORT   PREOPERATIVE DIAGNOSIS:  Left knee arthritis.   POSTOPERATIVE DIAGNOSIS:  Left knee arthritis.   OPERATION PERFORMED:  Left total knee arthroplasty using cemented posterior  stabilized femur size 5, Osteonics; tibia size 5 and polyethylene insert 10;  patella size 5.   SURGEON:  Anderson [name], M.D.   ASSISTANT:  Emogene Morgan. Laurina Bustle, M.D.   ANESTHESIA:  General endotracheal.   ESTIMATED BLOOD LOSS:  150 mL.   DRAINS:  Hemovac times one.   TOURNIQUET TIME:  One hour and 31 minutes at 300 mmHg.   DESCRIPTION OF PROCEDURE:  The patient was brought to the operating room  where general endotracheal anesthesia was induced.  Preoperative intravenous  antibiotics were administered.  The left leg and foot was prepped with  DuraPrep solution and draped in a sterile manner.  A proximal thigh  tourniquet was placed prior to prepping and draping.  Charlie Pitter was used to  cover the operative field.  The leg was elevated and exsanguinated with  Esmarch wrap.  Tourniquet was inflated.  Anterior incision to the knee was  utilized.  Skin and subcutaneous tissue sharply divided.  Medial  parapatellar arthrotomy was made.  The precise location of this arthrotomy  was marked with a #1 Vicryl suture.  The patella was everted.  Since this  was a valgus knee, minimal soft tissue elevation was performed on the medial  side.  Osteophytes were removed from the medial tibial plateau and lateral  tibial plateau.  The ACL and PCL were released.  The lateral patellofemoral  ligament was released.  Soft tissue was released from  the superior anterior  distal portion of the femur.  Osteophytes were removed from the medial and  lateral flares.  At this time the fat pad was also partially excised.  The  intramedullary alignment was used for the distal femoral cutting guide,  placed to the center superior aspect of the intercondylar notch.  The distal  femur was noted to be significantly worn with tricompartmental arthritis.  The distal femur was cut in 5 degrees of valgus.  A 12 mm resection was  performed from the distal femur using oscillating saw with collateral  ligaments well protected.  The next cutting jig was applied parallel to the  epicondylar axis.  There was noted to be a size 5 comparable to the  contralateral side.  Anterior, posterior and chamfer cuts were then made.  The posterior retractor was placed and the PCL was  released.  The leveling  cut on the tibia was made, size 5 tray was placed to center the  intramedullary alignment.  Intramedullary alignment guide was then placed  down the tibial shaft perpendicular parallel to mechanical axis.  A 4 mm  resection was then made off of the most worn lateral side.  Following tibial  resection, the PCL was removed.  Posterior osteophytes were also removed and  the soft tissue was stripped.  A box cut was then performed and trial  components were placed.  The knee was found to have neutral to slight  hyperextension with the 10 mm spacer.  Patella tracked well.  Collateral  ligament stability was excellent at 0, 30 and 90 degrees of flexion.  At  this time trial components were removed.  Tibia was keel punched.  Patella  was then prepared by resecting 11 mm from a measured 22 mm patella.  The  size 5 button trial was placed and again the knee had excellent tracking and  good stability.  Trial components were removed and cut bony surfaces were  irrigated.  True components including a size 5 posterior stabilized femur,  size 5 tibia and 10 mm insert and size 5  patella were then cemented in  position.  Excess cement was removed.  Following cement hardening, the knee  was found to have good patellar tracking, good range of motion.  At this  time the tourniquet was released.  Bleeding points encountered were  controlled using electrocautery.  A Hemovac drain was placed.  Parapatellar  arthrotomy was closed using #1 Vicryl suture with the knee slightly flexed  over a bolster.  Skin and subcutaneous tissue were closed using interrupted  inverted 2-0 Vicryl suture followed by skin staples.  A bulky dressing was  then applied.  The patient tolerated the procedure well without immediate  complications.   TOURNIQUET TIME:  One hour 31 minutes.                                               Anderson [name], M.D.    GSD/MEDQ  D:  12/29/2003  T:  12/30/2003  Job:  [id number]

## 2011-04-28 NOTE — Op Note (Signed)
[name], [name]              ACCOUNT NO.:  [id number]   MEDICAL RECORD NO.:  [id number]          PATIENT TYPE:  INP   LOCATION:  1419                         FACILITY:  [facility]   PHYSICIAN:  Nelwyn Salisbury, M.D.  DATE OF BIRTH:  01/14/1930   DATE OF PROCEDURE:  01/22/2007  DATE OF DISCHARGE:                               OPERATIVE REPORT   PROCEDURE PERFORMED:  Colonoscopy with multiple cold biopsies.   ENDOSCOPIST:  Nelwyn Salisbury, M.D.   INSTRUMENT USED:  Pentax video colonoscope.   INDICATIONS FOR PROCEDURE:  A 77 year old African-American female with a  history of severe abdominal pain and abnormal CT of the abdomen showing  ischemic changes with focal colitis in the left colon and hepatic  flexure.   PREPROCEDURE PREPARATION:  Informed consent was procured from the  patient.  The patient was fasted for eight hours prior to the procedure  and prepped with a gallon of GoLytely the night prior to the procedure.  The risks and benefits of the procedure including a 10% miss rate for  cancer or polyps was discussed with the patient as well.   PREPROCEDURE PHYSICAL:  The patient had stable vital signs.  Neck  supple.  Chest clear to auscultation.  S1 and S2 regular.  Abdomen soft  with normal bowel sounds.   DESCRIPTION OF PROCEDURE:  The patient was placed in left lateral  decubitus position and sedated with fentanyl and Versed.  Once the  patient was adequately sedated and maintained on low flow oxygen and  continuous cardiac monitoring, the Olympus video colonoscope was  advanced from the rectum to the proximal right colon with difficulty.  There was a large amount of residual stool in the colon.  Multiple  washes were done.  Severe ulceration with focal ischemic changes were  noted from 40 to 80 cm (watershed area).  Multiple biopsies were done.  A small polyp was seen in the left colon which was not snared as the  patient had a very poor prep.  On advancing the scope  in to the right  colon, there was some stool in the cecum.  The cecal base was not  visualized.  The colonic mucosa in the right colon, however, appeared  normal.  Severe changes are noted in the mucosa from 40 to 80 cm.  The  rest of the colonic mucosa appeared healthy.  Retroflexion in the rectum  revealed a large amount of residual stool in the rectum as well.  The  patient tolerated the procedure well without complication.   IMPRESSION:  1. Severe ulcerative changes from 40 to 80 cm, consistent with      ischemic colitis.  2. Otherwise unrevealing exam to the proximal right colon.  3. Large amount of solid stool in the cecum.  Small lesions could have      been missed.   RECOMMENDATIONS:  1. Await pathology results.  2. Avoid all nonsteroidals for now.  3. Avoid putting the patient on medication that would cause mesenteric      ischemia.  4. Further recommendations will be made in follow-up once  the biopsy      results have been procured.      Nelwyn Salisbury, M.D.  Electronically Signed     JNM/MEDQ  D:  01/22/2007  T:  01/23/2007  Job:  [id number]   cc:   Bill Salinas. Rankins, M.D.  Fax: [phone]

## 2011-04-28 NOTE — Op Note (Signed)
[name], [name]              ACCOUNT NO.:  [id number]   MEDICAL RECORD NO.:  [id number]          PATIENT TYPE:  AMB   LOCATION:  ENDO                         FACILITY:  [facility]   PHYSICIAN:  Nelwyn Salisbury, M.D.  DATE OF BIRTH:  Jul 29, 1930   DATE OF PROCEDURE:  02/25/2007  DATE OF DISCHARGE:                               OPERATIVE REPORT   PROCEDURE PERFORMED:  Screening colonoscopy.   ENDOSCOPIST:  Nelwyn Salisbury, M.D.   INSTRUMENT USED:  Pentax video colonoscope.   INDICATIONS FOR PROCEDURE:  A 77 year old [place] American female with a  history of ischemic colitis and a poor prep on a previous colonoscopy,  undergoing a repeat exam for CRC screening.   PREPROCEDURE PREPARATION:  Informed consent was procured from the  patient.  The patient had fasted for 8 hours prior to the procedure and  prepped with a bottle of MiraLax and Gatorade the night prior to the  procedure. The risks and benefits of the procedure including a 10% miss  rate of cancer and polyps were discussed with the patient as well.   PREPROCEDURE PHYSICAL:  VITAL SIGNS:  The patient had stable vital  signs.  NECK:  Supple.  CHEST:  Clear to auscultation.  S1, S2 regular.  ABDOMEN:  Soft with normal bowel sounds.   DESCRIPTION OF PROCEDURE:  The patient was placed in the left lateral  decubitus position and sedated with 75 mcg of Fentanyl and 5 mg of  Versed given intravenously in slow incremental doses.  Once the patient  was adequately sedate and maintained on low-flow oxygen and continuous  cardiac monitoring, the Pentax video colonoscope was advanced from the  rectum to the cecum.  There was some residual stool in the colon.  Multiple washes were done. The appendiceal orifice and ileocecal valve  were visualized and photographed.  There was some solid stool in the  cecum.  This was displaced by multiple washes.  The terminal ileum was  briefly visualized and appeared normal.  There was evidence of  diverticulosis throughout the colon with more prominent changes in the  proximal right colon and in the sigmoid colon. These diverticula were  very small and in the early stages of formation. No masses or polyps  were seen. Retroflexion in the rectum revealed no abnormalities. Small  lesions could be missed.   IMPRESSION:  1. Pandiverticulosis with more prominent changes in the sigmoid colon      and the proximal right colon.  2. No masses or polyps seen.  3. Significant significant amount of residual stool in the colon.      Multiple washes done.  Visualization adequate.  4. No abnormalities noted on retroflexion.  5. Terminal ileum not visualized.   RECOMMENDATIONS:  1. Considering the patient's age, a gauge repeat colonoscopy has been      recommended in the next 10 years.  If she has any abnormal symptoms      in the interim, she should contact the office immediately for      further recommendations.  2. A high fiber diet and brochures on diverticulosis have  been given      to her for her education.  Liberal fluid intake has been advised.  3. Outpatient follow up on p.r.n. basis.      Nelwyn Salisbury, M.D.  Electronically Signed     JNM/MEDQ  D:  02/25/2007  T:  02/25/2007  Job:  [id number]   cc:   Bill Salinas. Rankins, M.D.

## 2011-04-28 NOTE — Discharge Summary (Signed)
[place]  Patient:    [name], [name] Visit Number: [id number] MRN: [id number]          Service Type: [facility] Location: [id number] Attending Physician:  Gilmore Laroche Dictated by:   Junius Argyle, PA Admit Date:  01/29/2002 Discharge Date: 02/04/2002   CC:         Nolon Bussing, M.D.  Hedda Slade, M.D.   Discharge Summary  DISCHARGE DIAGNOSES: 1. Status post right total hip replacement. 2. Degenerative joint disease of right knee. 3. Proteus urinary tract infection. 4. Postoperative anemia.  HISTORY OF PRESENT ILLNESS:  [name] is a 77 year old female with history of seizure disorder, hypertension, end-stage OA of right hip and no relief with conservative therapy.  She elected to undergo right total hip replacement on February 13, by Dr. Lorin Mercy.  Postoperatively, she is weightbearing as tolerated and on Coumadin for DVT prophylaxis.  Issues past surgery include pain control problems, decrease in H&H to 6.2 requiring transfusion of two units of packed red blood cells.  Follow up H&H was 8.5.  There were concerns regarding bleeding into the hip joint and x-rays of right hip were done that showed satisfactory position.  Coumadin has been held until recently. Weightbearing was at 50% and the patient is to wear knee immobilizer for ambulation secondary to OA of right knee.  She is currently close supervision for transfers, minimal assist to contact guard assist ambulating 80 feet.  PAST MEDICAL HISTORY: 1. Hypertension. 2. Carpal tunnel release. 3. Insomnia. 4. Fracture of right foot. 5. Osteoporosis. 6. Cataract surgery. 7. Right leg fracture. 8. Arteriosclerotic vascular disease.  ALLERGIES:  No known drug allergies.  SOCIAL HISTORY:  The patient lives alone in one-level home.  No steps at entry.  She was independent with cane prior to admission.  She does not use any tobacco or alcohol.  HOSPITAL COURSE:   [name] was admitted to rehabilitation on January 29, 2002, for inpatient therapies to consist of PT and OT daily. Past admission, the patient was maintained on Coumadin for DVT prophylaxis with cross-coverage of Lovenox.  Bilateral lower extremity duplexes were done showing no evidence of DVT.  Postop anemia was supplemented with iron supplements.  Labs done on February 20, showed H&H to be recovering nicely at 10.9 and 31.5.  A check of electrolytes showed sodium 134, potassium 4.4, chloride 102, CO2 24, BUN 9, creatinine 0.9 and glucose 92.  LFTs were within normal limits.  UA done showed trace leukocytes with rare wbcs and bacteria. Urine culture, however, grew out greater than 100,000 colonies of Proteus mirabilis and Pseudomonas aeruginosa.  She was started on Cipro for this and is to continue seven total days of antibiotic therapy.  Her hip incision has healed well without any signs or symptoms of infection with no erythema noted. Staples were discontinued on postop day #11 without difficulty.  She has had edema and multiple complaints of pain of right knee.  She was noted to twist her knee while in the bathroom on February 23.  X-rays done showed no fracture, just severe DJD.  The patient is to continue to wear knee immobilizer on right knee for ambulation and will decide about knee replacement in the future.  The patient made good progress during her stay in rehabilitation.  She was modified independent for ADLs including toileting. She was modified independent for transfers.  She was modified independent for ambulating 50+ feet.  She was made  modified independent in room without any difficulty.  Further followup therapies to include home health PT/OT to continue by [facility] past discharge.  The patient to continue on Coumadin through February 20, 2002.  The patient is discharged on 7.5 mg of Coumadin a day with the next protime to be checked in 24  hours.  On February 04, 2002, the patient is discharged to home.  DISCHARGE MEDICATIONS  1. Coumadin 7.5 mg a day.  2. Norvasc 10 mg a day.  3. Celebrex 100 mg b.i.d.  4. Cipro 250 mg b.i.d.  5. Cardura 2 mg q.h.s.  6. Lamictal 300 mg b.i.d.  7. OxyContin CR 20 mg b.i.d. x1 week and then one q.d. x1 week and then     discontinue.  8. Dilantin 100 mg b.i.d.  9. Phenobarbital 4 q.h.s. 10. Mavik 4 mg a day. 11. Oxycodone IR 5-10 mg p.o. q.4-6h. p.r.n. pain.  ACTIVITY:  Wear immobilizer on the right knee to walk.  Use walker with partial weightbearing.  DIET:  Regular.  WOUND CARE:  Keep area clean and dry.  SPECIAL INSTRUCTIONS:  No aspirin or ibuprofen.  May use Tylenol p.r.n. Resume aspirin past February 20, 2002.  [place] to provide PT/OT and R.N. to draw protime on February 26, with Guadalupe Guerra to follow and adjust Coumadin.  FOLLOWUP:  The patient is to follow up with Dr. Jenean Lindau in two weeks.  Follow up with Dr. Jarvis Morgan as needed. Dictated by:   Junius Argyle, PA Attending Physician:  Gilmore Laroche DD:  02/04/02 TD:  02/04/02 Job: [id number]

## 2011-04-28 NOTE — Op Note (Signed)
NAMECATHELEEN, [name]              ACCOUNT NO.:  [id number]   MEDICAL RECORD NO.:  [id number]          PATIENT TYPE:  AMB   LOCATION:  SDS                          FACILITY:  [facility]   PHYSICIAN:  Kathrin Penner, M.D.   DATE OF BIRTH:  Nov 03, 1930   DATE OF PROCEDURE:  08/08/2006  DATE OF DISCHARGE:  08/08/2006                                 OPERATIVE REPORT   PREOPERATIVE DIAGNOSIS:  Right inguinal hernia.   POSTOPERATIVE DIAGNOSIS:  Right inguinal hernia.   PROCEDURE:  Right inguinal herniorrhaphy with mesh.   SURGEON:  Kathrin Penner, M.D.   ASSISTANT:  OR tech.   ANESTHESIA:  General.   INDICATION:  [name] is a 77 year old patient with a right-sided groin  hernia, presenting for repair.  She is aware of the risks and potential  benefits of surgery and gives her consent to same.   PROCEDURE:  The patient was positioned supinely.  Following the induction of  satisfactory general anesthesia, the groin is prepped and draped to be  included in a sterile operative field.  The right lower abdominal crease is  infiltrated was 0.25% Marcaine with epinephrine and a transverse incision is  made in the lower abdominal crease and deepened through the skin and  subcutaneous tissue, dissecting down to the external oblique aponeurosis.  The external oblique aponeurosis was opened up through the external inguinal  ring with mobilization and elevation of the round ligament, which was  dissected free from the pubic tubercle.  The dissection was carried up to  the internal ring, which was cleared of the surrounding fatty tissue.  The  entire round ligament is then suture-ligated at its base and then reduced  into the retroperitoneum.  The retroperitoneum is cleared from of the  undersurface of the transversalis fascia and a Prolene hernia mesh system,  large, is placed into the preperitoneal space and deployed to superiorly,  inferiorly, medially and laterally.  The external portion of the  mesh is  then brought up into the inguinal canal and sutured into the pubic tubercle,  carrying the suture line along the conjoined tendon up to the internal ring  and again from the pubic tubercle, up along the shelving edge of Poupart's  ligament to the internal ring.  The remainder of the mesh is tacked down  into the internal oblique muscles.  All areas of dissection are checked for  hemostasis and no additional bleeding points are noted.  The external  oblique aponeurosis is closed over the mesh with a running 2-0 Vicryl  suture.  Subcutaneous tissue is closed with Scarpa fascia with a running 3-0  Vicryl  sutures.  Skin is closed with a running 4-0 Monocryl suture and reinforced  with Steri-Strips, sterile dressings applied, anesthetic reversed and  patient removed from the operating room to the recovery room in stable  condition.  She tolerated the procedure well.      Kathrin Penner, M.D.  Electronically Signed     PB/MEDQ  D:  08/17/2006  T:  08/17/2006  Job:  [id number]

## 2011-04-28 NOTE — H&P (Signed)
[place]  Patient:    [name], [name] Visit Number: [id number] MRN: [id number]          Service Type: [facility] Location: [id number] Attending Physician:  Gilmore Laroche Dictated by:   Hedda Slade, M.D. Admit Date:  04/15/2002                           History and Physical  HISTORY OF PRESENT ILLNESS:  [name] is a 77 year old lady with severe right knee osteoarthritis and valgus deformity manifested by pain interfering with most activities, and x-rays demonstrating tricompartmental osteoarthritis with increased valgus.  We have treated her with injections to include Synvisc and that has given her some temporary relief.  We fully discussed total knee arthroplasty, the multiple possible complications.  She seems to understand and agree and would like to proceed on with same.  ALLERGIES:  None known.  PAST MEDICAL HISTORY:  She has high blood pressure, ASCVD, seizure disorder, bilateral osteoarthritis of the knees and hips.  MEDICATIONS: 1. Norvasc 10 mg two q.a.m. 2. Lamictal 100 mg three b.i.d. 3. Doxazosin 2 mg two at h.s. 4. Dilantin 100 mg b.i.d. 5. Phenobarbital 30 mg four at h.s. 6. Mavik 4 mg q.d.  REVIEW OF SYSTEMS:  She had right total hip arthroplasty in February 2003. She had some postoperative anemia and postoperative Proteus urinary tract infection which was treated.  PHYSICAL EXAMINATION:  VITAL SIGNS:  She is 5 feet 1.5 inches, weighs 171 pounds.  Temperature 98.5, blood pressure 142/90, pulse 80, respirations 20.  HEENT:  She is wearing glasses.  Extraocular movements are full.  Tympanic membranes are intact, have good light reflex.  The pharynx is clear.  She is wearing two dentures.  NECK:  Moves comfortably.  She has no palpable mass nor carotid bruit.  CHEST:  Breath sounds are clear.  CARDIAC:  Heart sounds are regular.  No murmurs heard.  ABDOMEN:  Soft, nontender, no palpable masses.  Bowel sounds  are present.  MUSCULOSKELETAL:  Evaluation of the right hip demonstrates the femoral pulse is good without bruit.  Posterior tibial pulse is good.  The right hip flexes to 90 degrees, will abduct widely.  It might flex further but I did not push that today.  The right knee motion is from 10-15 to 110 degrees flexion. There is 12-15 degrees of valgus.  There is crepitation and there is no perfusion.  She can only partially be corrected passively.  ADMITTING DIAGNOSES: 1. Osteoarthritis right knee with valgus deformity. 2. Status postoperative right total hip arthroplasty, nine weeks. 3. Hypertension. 4. Seizure disorder.  PLAN:  Right total knee arthroplasty.  We fully discussed the multiple possible complications.  The patient would like to proceed on with same.  She was given a prescription for postoperative Tylox, Darvocet, and Coumadin, and this was discussed with her.  Again, it was emphasized there are no guarantees, and she would like to proceed on with same. Dictated by:   Hedda Slade, M.D. Attending Physician:  Gilmore Laroche DD:  04/08/02 TD:  04/08/02 Job: [id number]

## 2011-04-28 NOTE — Discharge Summary (Signed)
[place]  Patient:    CYNTHIE, RAMSELL Visit Number: [id number] MRN: [id number]          Service Type: [facility] Location: [id number] Attending Physician:  Gilmore Laroche Dictated by:   Hedda Slade, M.D. Admit Date:  01/29/2002 Discharge Date: 02/04/2002                             Discharge Summary  OPERATIVE PROCEDURE: Right total hip arthroplasty with cementing of femoral component on January 23, 2002.  HISTORY OF PRESENT ILLNESS: For details see that dictated on admission.  ADMISSION DIAGNOSES:  1. Right hip osteoarthritis, severe, with protrusio.  2. Bilateral osteoarthritis of the knees.  3. Hypertension.  4. Seizure disorder.  DISCHARGE DIAGNOSES:  1. Right hip osteoarthritis, severe, with protrusio.  2. Bilateral osteoarthritis of the knees.  3. Hypertension.  4. Seizure disorder.  HOSPITAL COURSE: For History and Physical, see that dictated on admission.  On January 23, 2002 the patient was taken to the operating room, where she underwent right total hip arthroplasty as detailed in the operative note.  The initial estimated blood loss was 300-400 cc.  Preoperatively and postoperatively she was on IV antibiotics.  She was begun on Coumadin postoperatively.  The first day postoperatively her Foley was discontinued. Her Hemovac was removed.  She was able to set quads well and do ankle pulls well.  The following day she seemed to have pain in her incisional site and difficulty participating in physical therapy.  It was found that her hemoglobin had dropped to 6.2.  She was therefore given two units of packed red blood cells.  Coumadin was put on hold.  Her hemoglobin subsequently stabilized at 9.6.  Because of pain she had x-ray of the right hip on January 27, 2002 and there was no evidence of dislocation.  We observed her and she gradually became ambulatory without further problems other than her right knee  osteoarthritis was painful; therefore, requiring use of a knee immobilizer with ambulation. When stable and apparently able to participate in physical therapy she was discharged to be admitted to the rehab service on January 29, 2002.  LABORATORY DATA: EKG was normal sinus rhythm with left bundle branch block, no significant change since EKG of January 16, 1998.  Urinalysis normal.  Initial routine chemistries normal.  Sodium dropped down to 122 and gradually returned to normal to 130 and 133.  The last evaluation currently was done in rehab and I do not have that at this time. Dictated by:   Hedda Slade, M.D. Attending Physician:  Gilmore Laroche DD:  02/18/02 TD:  02/18/02 Job: [id number]

## 2011-04-28 NOTE — Op Note (Signed)
NAMEAIZLYN, GOTCH                        ACCOUNT NO.:  [id number]   MEDICAL RECORD NO.:  [id number]                   PATIENT TYPE:  OIB   LOCATION:  2871                                 FACILITY:  [facility] [facility]   PHYSICIAN:  Kathrin Penner, M.D.                DATE OF BIRTH:  07-22-1930   DATE OF PROCEDURE:  06/01/2004  DATE OF DISCHARGE:  06/01/2004                                 OPERATIVE REPORT   PREOPERATIVE DIAGNOSIS:  Duct papillomatosis of the left breast.   POSTOPERATIVE DIAGNOSIS:  Duct papillomatosis of the left breast, pathology  pending.   PROCEDURE:  Major duct excision of the left breast.   SURGEON:  Saralyn Pilar L. Bubba Camp, M.D.   ASSISTANT:  Nurse   ANESTHESIA:  General.   NOTE:  Ms. Hrabovsky is a 78 year old lady presenting with a left sided nipple  discharge which on ductogram shows a filling defect within the ductal system  of the left breast.  She comes to the operating room now for excision of  this area to rule out the possibility of a papillary carcinoma.   PROCEDURE:  Following the induction of satisfactory general anesthesia with  the patient positioned supine, the left breast was prepped and draped to be  included in the sterile operative field.  I infiltrated the region below the  nipple with 0.5% Marcaine plain.  A circumareolar incision was carried down  through the skin and subcutaneous tissue with dissection lifting the middle  as a flap and carrying it inferiorly.  Dissection was carried down to  approximately 5 cm deep into the breast and around the area of duct ectasia  and multiple dilated ducts were noted.  These were dissected free from the  back side of the nipple.  The entire specimen was then removed and forwarded  for pathologic evaluation.  Hemostasis was assured with electrocautery.  The  breast tissues were reapproximated with 2-0 Vicryl sutures, the subcutaneous  tissues were closed with 3-0 Vicryl sutures, and the skin was closed with  running 5-0 Monocryl suture reinforced with Steri-Strips.  Sterile  compressive dressing was applied, the anesthetic reversed, and the patient  removed from the operating room to the recovery room in stable condition,  having tolerated the procedure well.                                               Kathrin Penner, M.D.    PB/MEDQ  D:  06/01/2004  T:  06/01/2004  Job:  [id number]

## 2011-04-28 NOTE — Op Note (Signed)
[name], [name]              ACCOUNT NO.:  [id number]   MEDICAL RECORD NO.:  [id number]          PATIENT TYPE:  AMB   LOCATION:  SDS                          FACILITY:  [place]   PHYSICIAN:  Kathrin Penner, M.D.   DATE OF BIRTH:  07-14-1930   DATE OF PROCEDURE:  08/08/2006  DATE OF DISCHARGE:  08/08/2006                                 OPERATIVE REPORT   PREOPERATIVE DIAGNOSIS:  Right inguinal hernia.   POSTOPERATIVE DIAGNOSIS:  Right inguinal hernia.   PROCEDURE:  Right inguinal herniorrhaphy with mesh.   SURGEON:  Dr. Bubba Camp.   ASSISTANT:  OR nurse.   ANESTHESIA:  General.   Ms. Theriault is a 77 year old patient with a right-sided groin bulge which has  been increasing in size and on evaluation is a right-sided inguinal hernia.  The patient comes to the operating room now for repair.   She understands the risks and potential benefits of surgery and gives her  consent to same.   Following induction of satisfactory general anesthesia with the patient  positioned supinely, the right groin was prepped and draped to be included  in a sterile operative field.  Transverse incision in the right groin taken  through skin and subcutaneous tissues, carried down to the external oblique  aponeurosis.  This was opened up through the external inguinal ring with  protection of the ilioinguinal nerve.  Dissection was carried down to the  region of the pubic tubercle where the round ligaments is isolated, divided  and ligated.  The round ligament was then dissected free up to the internal  ring and the entire hernia with the round ligament was then reduced into the  retroperitoneal space.  Retroperitoneal space was cleared, and the Prolene  hernia system of polypropylene mesh was then deployed into the  retroperitoneal space superiorly, inferiorly, laterally and medially.  The  external portion of the Prolene hernia mesh system was brought up into the  inguinal canal and deployed into  the inguinal canal, sutured down in the  pubic tubercle, carrying a running suture line of 2-0 Prolene up along the  conjoined tendon up to the internal ring and again from the pubic tubercle  up along the shelving edge of Poupart's ligament up to the internal ring.  The posterior portion was then deployed over the entire inguinal ring and  was sutured into the internal oblique muscle.  This completely obliterated  the internal inguinal ring.  All areas of dissection then checked for  hemostasis, noted to be dry.  Sponge, instrument and sharp counts were  verified.  External oblique aponeurosis closed with a running 2-0  Vicryl suture, Scarpa's fascia closed with a running 3-0 Vicryl suture and  skin closed with a running 4-0 Monocryl suture, then reinforced with Steri-  Strips.  Sterile dressing was applied.  The anesthetic reversed, and the  patient removed from the operating room to the recovery room in stable  condition.  She tolerated the procedure well.      Kathrin Penner, M.D.  Electronically Signed     PB/MEDQ  D:  08/23/2006  T:  08/24/2006  Job:  [id number]

## 2011-04-28 NOTE — Op Note (Signed)
NAMEBRITTIE, OSTERLUND NO.:  [id number]   MEDICAL RECORD NO.:  [id number]          PATIENT TYPE:  OIB   LOCATION:  5015                         FACILITY:  [place]   PHYSICIAN:  Anderson [name], M.D.    DATE OF BIRTH:  11-Sep-1930   DATE OF PROCEDURE:  12/18/2006  DATE OF DISCHARGE:                               OPERATIVE REPORT   PREOPERATIVE DIAGNOSES:  Right shoulder bursitis, labral tearing,  rotator cuff tear.   POSTOPERATIVE DIAGNOSES:  Right shoulder bursitis, labral tearing,  rotator cuff tear.   PROCEDURES:  Right shoulder diagnostic and operative arthroscopy with  extensive debridement of chondral flaps off the humeral head, synovitis  and torn labrum, with arthroscopic subacromial decompression and mini-  open rotator cuff repair.   ATTENDING SURGEON:  Anderson [name], M.D.   ASSISTANT:  None.   ANESTHESIA:  General endotracheal.   ESTIMATED BLOOD LOSS:  15 mL.   DRAINS:  None.   OPERATIVE INDICATIONS:  [name] is a 77 year old patient with  significant right shoulder pain that has been refractory to nonoperative  management.  She presents now for diagnostic and operative arthroscopy,  debridement and bursectomy, possible biceps tenodesis versus tenotomy,  and possible rotator cuff repair.   OPERATIVE FINDINGS:  On examination under anesthesia, range of motion,  external rotation 15 degrees, abduction 80 degrees, forward flexion is  180.  On a second range, abduction is 120 on the right.   Diagnostic and operative arthroscopy:  1. Torn biceps tendon.  2. Degenerative chondral flap on the humeral head.  3. Remnant flap tear.  4. Synovitis.  5. Bursitis.  6. Near full-thickness tear of the superior edge of the leading edge      of the supraspinatus, with intact intra-articular subscap.   PROCEDURES IN DETAIL:  The patient was brought to the operating room,  where general endotracheal anesthesia was induced.  Preoperative IV  antibiotics  were administered.  The right shoulder and hand and arm were  prepped with DuraPrep solution and draped in a sterile manner.  Topographic anatomy of the shoulder was identified, including the  anterior, posterior and lateral margin of the acromion and coracoid  process.  The patient was placed with the head in neutral position and  the left arm well padded and protected.  A solution of saline and  epinephrine was injected in the subacromial space.  A solution of saline  was injected into the glenohumeral joint.  A posterior portal was  created 2 cm medial and inferior to the posterolateral margin of the  acromion.  The portal was placed.  Diagnostic arthroscopy was performed.  An anterior portal was created under direct visualization.  The biceps  tendon was unattached to the superior aspect of the glenoid.  A remnant  flap tear was present.  This was extensively debrided.  Chondral flaps  on the humeral head were also debrided.  Synovitis was present and was  also debrided.  A partial-thickness rotator cuff tear on the bursal side  was debrided extensively.  At this time, after extensive debridement  within the intra-articular  space, the scope was placed into the  subacromial space.  A lateral portal was created.  Bursectomy was  performed.  Subacromial decompression was performed.  A tear of the  superior edge of the leading edge of the supraspinatus was identified.  At this time, the instruments were removed.  The anterior and posterior  portals were closed with nylon suture.  The lateral portal was extended  proximally and distally.  The deltoid was split a measured distance of 4  cm.  The rotator cuff was repaired in a watertight fashion using one 5.5  corkscrew and one pushlock.  The incision was thoroughly irrigated.  The  deltoid split was closed using 0 Vicryl suture, followed by interrupted,  inverted 2-0 Vicryl and 3-0 pullout Prolene.  A solution of Marcaine,  morphine and  clonidine was injected into the shoulder.  A bulky dressing  was placed.  The patient tolerated the procedure well without immediate  complications.      Anderson [name], M.D.  Electronically Signed     GSD/MEDQ  D:  12/18/2006  T:  12/18/2006  Job:  [id number]

## 2011-04-28 NOTE — H&P (Signed)
[place]. [facility]  Patient:    [name], [name] Visit Number: [id number] MRN: [id number]          Service Type: EMS Location: [place] Attending Physician:  Tor Netters Dictated by:   Hedda Slade, M.D. Admit Date:  01/17/2002 Discharge Date: 01/17/2002                           History and Physical  CHIEF COMPLAINT:  Mrs. Nitsch is a 61-1/75-year-old lady with a chief complaint of "my right hip and knees hurt so bad I can hardly walk."  HISTORY OF PRESENT ILLNESS:  The patient was evaluated in the office and found to have severe osteoarthrosis with protrusio of the right hip as well as moderate osteoarthritis of the left knees and mild osteoarthritis of the left hip.  We discussed her multiple problems, discussed multiple possible complications with surgery such as infection, dislocation, pain, limited motion, blood clots, etc.  This was thoroughly reviewed and she elected to proceed on with surgery.  She came in today to have this done.  ALLERGIES:  No known allergies.  MEDICATIONS: 1. Lamictal 100 mg 3 b.i.d. 2. Phenytoin sodium 100 mg 1 b.i.d. 3. Phenobarbital 30 mg for h.s. 4. Norvasc 10 mg 2 q.a.m. 5. Doxazosin mesylate 2 mg 2 at h.s. 6. Mavik 4 mg 1 q.d.  FAMILY HISTORY:  Noncontributory.  PAST SURGICAL HISTORY:  Carpal tunnel release.  Trigger thumb release. Fractured shoulder.  Angioplasty.  PAST MEDICAL HISTORY:  She has osteoporosis, osteoarthritis of the knees, cataracts, and hypertension.  ASCVD.  History of seizures.  PHYSICAL EXAMINATION:  VITAL SIGNS:  She is 5 feet 1-1/2 inches.  Weighs 172 pounds.  Blood pressure 172/84.  Respirations 16, temperature 98.1.  HEENT:  She has two dentures.  Her extraocular movements are full.  Tympanic membranes are intact.  Have good light reflex.  NECK:  She has no carotid bruits and no palpable cervical mass.  CHEST:  Volume was okay.  Breath sounds were clear.  HEART:  Regular  rhythm.  No murmurs heard.  ABDOMEN:  Round, soft, and nontender.  No palpable masses.  Bowel sounds are normal.  EXTREMITIES:  Evaluation of the right hip shows femoral pulses are good. Dorsalis pedis pulses good.  There is no bruit at the femoral pulse.  She has marked antalgic gait.  She has valgus knees.  Right hip rests at about 40 degrees of flexion, can further flex to about 80.  There is about 10 degree adduction contracture.  There is a jog of rotation.  LABORATORY DATA:  X-rays show severe osteoarthritis with deformity of the head and multiple cyst formation and protrusio.  ADMITTING DIAGNOSES: 1. Right hip osteoarthritis severe with protrusio. 2. Bilateral knee osteoarthritis. 3. Hypertension. 4. Seizure disorder.  PLAN:  Right total hip arthroplasty.  Full discussed.  No guarantees given. Dictated by:   Hedda Slade, M.D. Attending Physician:  Tor Netters DD:  01/20/02 TD:  01/20/02 Job: [id number]

## 2011-04-28 NOTE — Op Note (Signed)
[place]. [facility]  Patient:    [name], [name] Visit Number: [id number] MRN: [id number]          Service Type: SUR Location: [place] 01 Attending Physician:  Marlon Pel Dictated by:   Hedda Slade, M.D. Admit Date:  01/23/2002                             Operative Report  PREOPERATIVE DIAGNOSIS:  Osteoarthritis of the right hip.  POSTOPERATIVE DIAGNOSIS:  Osteoarthritis of the right hip.  OPERATIVE PROCEDURES:  Right total knee arthroplasty with cementing of femoral component.  ANESTHESIA:  General by endotracheal tube.  SURGEON:  Hedda Slade, M.D.  ASSISTANT:  Desiree Lucy. Larose Kells, M.D.  DESCRIPTION OF PROCEDURE:  The patient had an IV started.  She was given 1 g of Ancef IV.  She was then given a general anesthetic by endotracheal tobacco use be.  A Foley catheter was put in place.  She was placed on the operative table in the lateral position with the right side up.  The perineum was isolated with a U-drape.  She was stabilized by the [place] frame and then prepped from the edges of the drape to the toes with DuraPrep and then draped in the usual fashion.  This included the use of two Vi-Drapes.  The hip was quite tight, but we could flex it to about 90 degrees.  It would not abduct.  We made a straight posterolateral incision and went down to the level of the trochanteric bursa and divided the iliotibial band a short distance distal to the Zickle band and then digitally separated the fibers of the gluteus maximus.  We then inserted a Charnley retractor.  The sciatic nerve was then digitally identified and was followed throughout the case, being careful of its position.  The hip could not be externally rotated.  It was very tight.  It was encased in a large volume of osteophyte and did have some degree of protrusio.  So I carefully dissected free, tacked, and isolated her external rotators.  I then exposed a large part of the  acetabulum.  I then used an osteotome to remove interior, posterior, superior, and a little bit anterior osteophytes.  I then opened the capsule in an oblique fashion.  I freed the capsule from the neck and cut it obliquely.  I kept the tagged ends.  I went on to remove the rest of the external rotators.  I went on down and exposed the lesser trochanter and then put retractors in place and was able to gently dislocate the hip. The neck was then resected 12-13 mm in length and removed a worn out, deformed femoral head.  I then went on to remove more osteophytes around the periphery of the acetabulum and I incised the anterior capsule, all of this so as to afford visualization in the lateral reaming.  The acetabulum was then reamed sequentially up to size 52.  Most of the reaming was to expand the acetabulum.  Very little was used to deepen the acetabulum.  I then encountered several moderate sized cysts.  These were curetted free.  We then used curettement from the humeral head to pack into this cyst.  I then implanted with a very tight fit the Trident PSL acetabular shell 52E and about 40-45 degrees of abduction and 20 degrees or so of anteversion.  This cup seated well and was very,  very snug.  With the applicator I could view the whole pelvis as the acetabulum held.  Excess osteophytes still needed to be removed and they were posteriorly, inferiorly, and anteriorly.  We then addressed the femoral shaft, which was reamed and the rasped, fitting with a #6 Ion Plus cemented hip.  This allowed good filling proximally and good stability.  I then installed the neck and a +0 32 mm head and reduced the hip.  This allowed the hip to come into full extension and at that point the knee then flexed to about 100 degrees.  We had excellent internal rotation.  We had no shuck.  With full extension and rotation, there was no suggestion that it might sublux anteriorly.  At this point, I removed the  trial cup and inserted the whole eliminator after putting a small amount of graft through the central hole.  We then used pulsatile lavage and then installed the Trident 10 degree polyethylene insert 32E.  It snapped into place.  I attempted to lever it out, but it was well fixed.  Then on the femoral side, we took pulsatile lavage with a brush down the femoral shaft, inserted a size #3 cement plug, went on to irrigate further and then put a dry sponge down the shaft.  When the cement was ready, we abutted the femoral component with cement and put in a small centralizer.  We then injected the cement down the shaft, removed excess blood, and used the cement pressurizer to increase the pressure, following which the buttered femoral component was pushed down the shaft, keyed into place, and held in a neutral position.  All excess cement was removed.  It was held until the cement hardened.  After all of the cement had hardened, we did pulsatile lavage again.  We looked for all bits of debris and removed all we could find.  We then reduced the hip.  Again ran through the range of motion and it was all excellent and was very stable.  At this point, we closed the hip in layers using #1 nonabsorbable sutures to repair the capsule to itself and then to the base of the trochanter.  We then repaired the external rotators likewise.  We then repaired the IT band with figure-of-eight sutures of 1-0 Vicryl.  Then the rest of the tissues were approximated with 2-0 Vicryl.  The skin edges were approximated with metal staples.  One Hemovac was placed deep to the iliotibial band and brought out anterolaterally.  A bulky dressing was applied.  The patient was removed from the operating table and returned to her bed.  On return to the recovery room, she had excellent function of both legs. The estimated blood loss was 300-400 cc.  None was replaced.  Dictated by: Hedda Slade, M.D. Attending Physician:  Marlon Pel DD:  01/23/02 TD:  01/23/02 Job: [id number]

## 2011-04-28 NOTE — Discharge Summary (Signed)
[name], [name]                        ACCOUNT NO.:  [id number]   MEDICAL RECORD NO.:  [id number]                   PATIENT TYPE:  INP   LOCATION:  5006                                 FACILITY:  [facility]   PHYSICIAN:  Anderson [name], M.D.                 DATE OF BIRTH:  22-Aug-1930   DATE OF ADMISSION:  12/29/2003  DATE OF DISCHARGE:  01/05/2004                                 DISCHARGE SUMMARY   OPERATION:  Left total knee replacement, December 29, 2003.   HOSPITAL COURSE:  [name] is a 77 year old female with end-stage left  knee arthritis.  She presents for a total knee replacement that was  performed on December 29, 2003.  The patient tolerated the procedure well  without any immediate complications.  She was started on CPM machine for  knee range of motion and physical therapy for mobilization.  The patient's  hematocrit was 27.3 on postoperative day #1.  She was walking in the halls  on January 01, 2004.  Her creatinine was 1.1 at that time.  Hemoglobin was  9.1.  She continued to mobilize and was therapeutic on her Coumadin.  The  patient was transfused 1 unit of packed red blood cells for hemoglobin of  28.3 on January 03, 2004.  She had an otherwise unremarkable recovery.   She was transferred to rehab on January 01, 2004.  She will continue with  therapy for leg strengthening and range of motion.   DISCHARGE MEDICATIONS:  Include admission medications plus:  1. Coumadin for DVT prophylaxis.  2. Percocet for pain.   I will see her back in one week for recheck.                                                Anderson [name], M.D.    GSD/MEDQ  D:  02/11/2004  T:  02/11/2004  Job:  [id number]

## 2011-04-28 NOTE — Discharge Summary (Signed)
. [facility]  Patient:    Selena, Gonzalez Visit Number: [id number] MRN: [id number]          Service Type: [facility] Location: [id number] 01 Attending Physician:  [name] Dictated by:   [name]. [place], P.A. Admit Date:  04/15/2002 Discharge Date: 04/22/2002   CC:         [name], M.D.  [name], M.D.   Discharge Summary  DISCHARGE DIAGNOSES: 1. Right total knee arthroplasty Apr 10, 2002. 2. Postoperative anemia. 3. Hypertension. 4. Arteriosclerotic coronary vascular disease. 5. Seizure disorder. 6. History of right total hip replacement in February 2003.  HISTORY OF PRESENT ILLNESS:  This is a 77 year old female with a history of right total hip replacement in February 2003 for which she received inpatient rehabilitation services, now admitted Apr 10, 2002, with end-stage degenerative joint disease of the right knee and no relief with conservative care.  She underwent a right total knee arthroplasty on Apr 10, 2002, per Dr. Jenean Gonzalez.  She was placed on Coumadin for DVT prophylaxis and 50% partial weightbearing. Postoperative anemia transfused.  No chest pain or shortness of breath. Minimal assistance for ambulation.  CPM machine initiated.  Latest INR 1.4. Hemoglobin 10.5.  Chest x-ray negative.  Admitted for comprehensive rehabilitation program.  PAST MEDICAL HISTORY:  See Discharge Diagnoses.  Her primary physician is Dr. Zadie Gonzalez  MEDICATIONS PRIOR TO ADMISSION:  Lamictal, Mavik, Norvasc, Dilantin, phenobarbital.  PAST SURGICAL HISTORY:  Right total hip arthroplasty, right carpal tunnel release, renal angioplasty in 1999, cataract surgery.  ALLERGIES:  None.  SOCIAL HISTORY:  Denies alcohol or tobacco.  She lives alone in [place]. Independent with a walker prior to admission, used a cane at times.  One-level home, no steps to entry.  Local family works.  HOSPITAL COURSE:  The patient did well while on  rehabilitation services with therapies initiated on a b.i.d. basis.  The following issues are followed during the patients rehabilitation course.  Pertaining to [name] right total knee arthroplasty, she remained stable. Surgical site healing nicely.  Staples have been removed.  No signs of infection.  Neurovascular sensation remained intact.  She was 50% partial weightbearing.  She would follow up with Dr. Jenean Gonzalez.  CPM machine was initiated.  This was advanced and ongoing at time of discharge.  She would receive home health therapies as advised.  Pain management ongoing with OxyContin continuous release increased to 20 mg every 12 hours.  She was tapered from narcotics.  Postoperative anemia with latest hemoglobin of 10.6, hematocrit 31.  There were no bleeding episodes.  Her blood pressure remained controlled on Mavik and Norvasc as well as Cardura.  There was no headache or dizziness.  She continued on home regimen of seizure medication of phenobarbital, Dilantin, and Lamictal.  There was no seizure activity initiated.  She had bowel or bladder disturbances.  Overall, for her functional mobility, she was ambulating with minimal assistance for transfers and ambulation, simple set up for activities of daily living.  She will be discharged to home with ongoing home therapy.  She is continued on Coumadin for deep venous thrombosis prophylaxis.  Venous Doppler studies prior to discharge were negative.  She would complete Coumadin protocol to be followed by [place].  Latest labs showed an INR of 2.4.  Hemoglobin 10.6, hematocrit 31.  Sodium 138, potassium 4.4, BUN 10, creatinine 0.8.  DISCHARGE MEDICATIONS: 1. Coumadin daily, dose to be established on discharge. Coumadin  to be    completed May 11, 2002. 2. Cardura 4 mg at bedtime. 3. Lamictal twice daily. 4. Phenobarbital at bedtime. 5. Dilantin 100 mg twice daily. 6. Mavik 4 mg daily. 7. Norvasc 20 mg daily. 8.  OxyContin CR 10 mg every 12 hours x 1 week then discontinue. 9. Vicodin as needed for pain.  ACTIVITY:  50% partial weightbearing.  DIET:  Regular.  WOUND CARE:  Cleanse incision daily with warm soap and water.  SPECIAL INSTRUCTIONS:  Home health physical therapy.  Home health nurse for prothrombin time per [place] on Apr 25, 2002.  She should followup with Dr. Jenean Gonzalez, orthopedic services, call for appointment. Dictated by:   [name]. [place], P.A. Attending Physician:  [name] DD:  04/21/02 TD:  04/22/02 Job: [id number]

## 2011-04-28 NOTE — Discharge Summary (Signed)
[place]. [facility]  Patient:    [name], [name] Visit Number: [id number] MRN: [id number]          Service Type: [facility] Location: [id number] 01 Attending Physician:  Gilmore Laroche Dictated by:   Hedda Slade, M.D. Admit Date:  04/15/2002 Discharge Date: 04/22/2002                             Discharge Summary  ADMITTING DIAGNOSES:  1. Osteoarthritis of right knee with valgus, status postoperative right total     hip arthroplasty, nine weeks.  2. Hypertension.  3. Seizure disorder.  OPERATION/PROCEDURE: December 19, 2001, cemented right total knee arthroplasty.  DISCHARGE DIAGNOSES:  1. Osteoarthritis of right with valgus, status postoperative right total hip     arthroplasty, nine weeks.  2. Hypertension.  3. Seizure disorder.  HISTORY OF PRESENT ILLNESS: See that dictated on admission.  HOSPITAL COURSE: On the day of admission she was taken to the operating room, where she underwent right total knee arthroplasty, as detailed in the operative note.  At surgery it was felt that she had lost 300 cc of blood.  Preoperatively and postoperatively she was on prophylactic antibiotics.  Immediately postoperatively she was begun on Coumadin therapy.  Early motion.  The first day postoperatively her Foley was discontinued.  She was begun on CPM machine.  Her hematocrit dropped to 22.  Therefore, on Apr 12, 2002 she was given 2 units of packed red blood cells, which then brought her hematocrit from 22 to 30 by Apr 13, 2002.  Her hemoglobin was 10.5.  On Apr 15, 2002 she was fine.  She was progressing well.  She was therefore discharged to be admitted to the rehab service with plans on 50% weightbearing and progress from there.  LABORATORY DATA: Some of the laboratory data obtained during this hospitalization, admitting hemoglobin 13.6, low hemoglobin 7.5, post transfusion hemoglobin 10.5.  CBC was normal.  Routine chemistries were normal.  Glucose 96.   Creatinine 0.9, BUN 12.  Urinalysis normal.  Routine culture of urine with 75,000 colonies of diptheroid.  PLAN: For me to follow this patient in rehab. Dictated by:   Hedda Slade, M.D. Attending Physician:  Gilmore Laroche DD:  05/08/02 TD:  05/09/02 Job: [id number]

## 2011-04-28 NOTE — Discharge Summary (Signed)
[name], [name]                        ACCOUNT NO.:  [id number]   MEDICAL RECORD NO.:  [id number]                   PATIENT TYPE:  IPS   LOCATION:  [id number]                                 FACILITY:  [place]   PHYSICIAN:  Lauraine Rinne, P.A.               DATE OF BIRTH:  January 08, 1930   DATE OF ADMISSION:  01/05/2004  DATE OF DISCHARGE:                                 DISCHARGE SUMMARY   DISCHARGE DIAGNOSES:  1. Left total knee arthroplasty January 18 secondary to degenerative joint     disease.  2. Postoperative anemia.  3. Pain management.  4. Coumadin for deep vein thrombosis.  5. Hypertension.  6. History of seizure disorder.  7. Coronary artery disease.  8. History of right total knee arthroplasty as well as right total hip     arthroplasty.   HISTORY OF PRESENT ILLNESS:  A 77 year old female with multiple joint  replacements, well known to rehab services, now admitted January 18 with end-  stage degenerative joint disease of the left knee and no relief with  conservative care. Underwent a left total knee arthroplasty January 18 per  Dr. Marlou Sa. Placed on Coumadin for deep vein thrombosis, weight bearing as  tolerated. Postoperative anemia 8.2 and transfused on January 23, total  assist for bed mobility, minimal assistance for ambulation, latest INR of  1.7, hemoglobin 10.1, admitted for comprehensive rehab program.   PAST MEDICAL HISTORY:  See discharge diagnoses.   PAST SURGICAL HISTORY:  1. Right total knee arthroplasty January 2003.  2. Right total hip arthroplasty February 2003.  3. Right renal artery stent.  4. Right cataract surgery.   ALLERGIES:  None.   HABITS:  Denies alcohol or tobacco.   PRIMARY CARE PHYSICIAN:  Medically, she is followed by [facility].   MEDICATIONS PRIOR TO ADMISSION:  1. Mavik 4 mg twice daily.  2. Dilantin 100 mg twice daily.  3. Aspirin 81 mg daily.  4. Norvasc 10 mg two tablets daily.  5. Phenobarbital 32.4 mg four tablets at  bedtime.  6. Albuterol inhaler as needed.   SOCIAL HISTORY:  Lives in [place]. One level apartment. No steps to  entry. She does have a Sport and exercise psychologist four hours a day, five days a week. She  was sedentary prior to admission. Family with good support.   HOSPITAL COURSE:  The patient did well while on rehabilitation services with  therapies initiated on a b.i.d. basis. The following issues were followed  during patient's rehab course. Pertaining to [name]'s left total knee  arthroplasty, surgical site healing nicely. Staples have been removed. No  signs of infection. She was now on scheduled OxyContin 10 mg every 12 hours  for pain control with good results. Coumadin for deep vein thrombosis  prophylaxis with latest INR of 1.9. She would be followed by advanced home  care for completion of protocol. Postoperative anemia with hemoglobin of  9.3, hematocrit 27.9. Her  blood pressures had some initial elevations. Her  Mavik was resumed back to home dosages as well as hydrochlorothiazide 12.5  mg added on January 10, 2004. She remained on her Dilantin and phenobarbital  for history of seizure disorder which was without issue during her rehab  stay. No chest pain or shortness of breath. No bowel or bladder  disturbances. Overall for her functional mobility, she was modified  independent with her ambulation, flexion 75 degrees. A home CPM machine  would be initiated. She required simple set up for activities of daily  living although moderate assist for lower body. Home health therapies had  been arranged.   Latest labs showed a hemoglobin 9.3, hematocrit 27.9. Sodium 141, potassium  4.4, BUN 9, creatinine 0.9.   DISCHARGE MEDICATIONS:  1. Coumadin 7.5 mg daily, adjusted accordingly, to be completed January 29, 2004.  2. Dilantin 100 mg twice daily.  3. Norvasc 10 mg daily.  4. Lamictal 300 mg twice daily.  5. Phenobarbital 130 mg at bedtime.  6. Ditropan 5 mg daily.  7.  Trinsicon twice daily.  8. Mavik 4 mg twice daily.  9. Hydrochlorothiazide 12.5 mg daily.  10.      OxyContin CR 10 mg every 12 hours x1 week then discontinue.  11.      Oxycodone as needed pain.   ACTIVITY:  Weight bearing as tolerated.   DIET:  Regular.   WOUND CARE:  Cleanse incision daily warm soap and water.   SPECIAL INSTRUCTIONS:  Home health nurse per [facility] to check INR  on January 15, 2004; per [facility], home health physical and  occupational therapy. The patient should follow with Dr. Marlou Sa, orthopedic  services.                                                Lauraine Rinne, P.A.    DA/MEDQ  D:  01/12/2004  T:  01/12/2004  Job:  [id number]   cc:   G. Alphonzo Severance, M.D.  [place]  [place] [phone]  Fax: [phone]   [facility]

## 2011-06-09 ENCOUNTER — Encounter: Payer: Self-pay | Admitting: Gastroenterology

## 2011-08-31 LAB — DIFFERENTIAL
Eosinophils Absolute: 0.1
Lymphocytes Relative: 26
Lymphs Abs: 2.2
Monocytes Relative: 4
Neutro Abs: 5.9
Neutrophils Relative %: 68

## 2011-08-31 LAB — COMPREHENSIVE METABOLIC PANEL
Alkaline Phosphatase: 142 — ABNORMAL HIGH
BUN: 8
CO2: 27
GFR calc non Af Amer: 52 — ABNORMAL LOW
Glucose, Bld: 100 — ABNORMAL HIGH
Potassium: 3.3 — ABNORMAL LOW
Total Protein: 7.6

## 2011-08-31 LAB — CBC
HCT: 37.5
Hemoglobin: 12.7
MCHC: 33.8
RDW: 12.2

## 2011-08-31 LAB — URINALYSIS, ROUTINE W REFLEX MICROSCOPIC
Glucose, UA: NEGATIVE
Hgb urine dipstick: NEGATIVE
Ketones, ur: NEGATIVE
Leukocytes, UA: NEGATIVE
Protein, ur: 100 — AB
Urobilinogen, UA: 0.2

## 2011-08-31 LAB — LIPASE, BLOOD: Lipase: 16

## 2011-08-31 LAB — URINE MICROSCOPIC-ADD ON

## 2011-08-31 LAB — PROTIME-INR
INR: 0.9
Prothrombin Time: 12.6

## 2011-09-01 LAB — COMPREHENSIVE METABOLIC PANEL
ALT: 15
AST: 18
CO2: 28
Chloride: 88 — ABNORMAL LOW
GFR calc Af Amer: 52 — ABNORMAL LOW
GFR calc non Af Amer: 43 — ABNORMAL LOW
Glucose, Bld: 95
Sodium: 127 — ABNORMAL LOW
Total Bilirubin: 0.5

## 2011-09-01 LAB — URINALYSIS, ROUTINE W REFLEX MICROSCOPIC
Bilirubin Urine: NEGATIVE
Ketones, ur: NEGATIVE
Nitrite: NEGATIVE
Protein, ur: NEGATIVE
Urobilinogen, UA: 0.2

## 2011-09-01 LAB — BASIC METABOLIC PANEL
BUN: 6
Calcium: 8.7
Creatinine, Ser: 1.2
GFR calc non Af Amer: 44 — ABNORMAL LOW
Glucose, Bld: 114 — ABNORMAL HIGH

## 2011-09-01 LAB — DIFFERENTIAL
Basophils Absolute: 0
Eosinophils Relative: 2
Lymphocytes Relative: 14
Neutro Abs: 10.7 — ABNORMAL HIGH
Neutrophils Relative %: 78 — ABNORMAL HIGH

## 2011-09-01 LAB — CBC
HCT: 35.9 — ABNORMAL LOW
Platelets: 396
RDW: 12.4

## 2011-09-04 LAB — URINALYSIS, ROUTINE W REFLEX MICROSCOPIC
Protein, ur: NEGATIVE
Urobilinogen, UA: 0.2

## 2011-09-04 LAB — COMPREHENSIVE METABOLIC PANEL
AST: 22
Albumin: 3.6
Chloride: 94 — ABNORMAL LOW
Creatinine, Ser: 1.08
GFR calc Af Amer: 60 — ABNORMAL LOW
Sodium: 131 — ABNORMAL LOW
Total Bilirubin: 1.2

## 2011-09-04 LAB — POCT CARDIAC MARKERS
CKMB, poc: <1 — ABNORMAL LOW
Myoglobin, poc: 67

## 2011-12-21 ENCOUNTER — Emergency Department (HOSPITAL_COMMUNITY)
Admission: EM | Admit: 2011-12-21 | Discharge: 2011-12-21 | Disposition: A | Discharge status: Home / Self Care | Payer: Medicare Other | Attending: Emergency Medicine | Admitting: Emergency Medicine

## 2011-12-21 ENCOUNTER — Encounter (HOSPITAL_COMMUNITY): Payer: Self-pay | Admitting: Emergency Medicine

## 2011-12-21 DIAGNOSIS — I10 Essential (primary) hypertension: Secondary | ICD-10-CM | POA: Insufficient documentation

## 2011-12-21 DIAGNOSIS — I2510 Atherosclerotic heart disease of native coronary artery without angina pectoris: Secondary | ICD-10-CM | POA: Diagnosis not present

## 2011-12-21 DIAGNOSIS — S8990XA Unspecified injury of unspecified lower leg, initial encounter: Secondary | ICD-10-CM | POA: Insufficient documentation

## 2011-12-21 DIAGNOSIS — Z7982 Long term (current) use of aspirin: Secondary | ICD-10-CM | POA: Insufficient documentation

## 2011-12-21 DIAGNOSIS — S99921A Unspecified injury of right foot, initial encounter: Secondary | ICD-10-CM

## 2011-12-21 DIAGNOSIS — S99929A Unspecified injury of unspecified foot, initial encounter: Secondary | ICD-10-CM | POA: Insufficient documentation

## 2011-12-21 DIAGNOSIS — M79609 Pain in unspecified limb: Secondary | ICD-10-CM | POA: Diagnosis not present

## 2011-12-21 DIAGNOSIS — Z5189 Encounter for other specified aftercare: Secondary | ICD-10-CM | POA: Diagnosis not present

## 2011-12-21 DIAGNOSIS — X58XXXA Exposure to other specified factors, initial encounter: Secondary | ICD-10-CM | POA: Insufficient documentation

## 2011-12-21 HISTORY — DX: Essential (primary) hypertension: I10

## 2011-12-21 NOTE — ED Notes (Signed)
Rt big toe bleeding oozing real red this am has only small amt blood on toe now bleeding controlled at [facility] time is on coumadin no pain

## 2011-12-21 NOTE — ED Provider Notes (Signed)
History     CSN: [id number]  Arrival date & time 12/21/11  1101   First MD Initiated Contact with Patient 12/21/11 1350      Chief Complaint  Patient presents with  . Toe Pain    (Consider location/radiation/quality/duration/timing/severity/associated sxs/prior treatment) Patient is a 78 y.o. female presenting with toe pain. The history is provided by the patient.  Toe Pain This is a new problem. The current episode started 3 to 5 hours ago. The problem has been rapidly improving. Pertinent negatives include no chest pain, no abdominal pain, no headaches and no shortness of breath. The symptoms are aggravated by nothing. The symptoms are relieved by nothing. She has tried nothing for the symptoms.   Onset of bleeding to right great toe at about 9 in the morning while walking around in her bedroom slippers. No distinct history of trauma or injury. Patient is on Plavix she is not on Coumadin. Bleeding has now stopped the blood was coming from the edge of the toe nail.   Past Medical History  Diagnosis Date  . Coronary artery disease   . Hypertension     No past surgical history on file.  No family history on file.  History  Substance Use Topics  . Smoking status: Never Smoker   . Smokeless tobacco: Not on file  . Alcohol Use: No    OB History    Grav Para Term Preterm Abortions TAB SAB Ect Mult Living                  Review of Systems  Constitutional: Negative for fever.  HENT: Negative for congestion and neck pain.   Eyes: Negative for visual disturbance.  Respiratory: Negative for cough and shortness of breath.   Cardiovascular: Negative for chest pain.  Gastrointestinal: Negative for nausea, vomiting and abdominal pain.  Genitourinary: Negative for dysuria.  Musculoskeletal: Negative for back pain.  Skin: Positive for wound. Negative for rash.  Neurological: Negative for headaches.  Hematological: Does not bruise/bleed easily.    Allergies  Ativan;  Cephalexin; and Haldol  Home Medications   Current Outpatient Rx  Name Route Sig Dispense Refill  . AMLODIPINE BESYLATE 10 MG PO TABS Oral Take 10 mg by mouth daily.    . ASPIRIN EC 81 MG PO TBEC Oral Take 81 mg by mouth daily.    Marland Kitchen BENAZEPRIL HCL 20 MG PO TABS Oral Take 20 mg by mouth daily.    Marland Kitchen CLONIDINE HCL 0.1 MG PO TABS Oral Take 0.1 mg by mouth 2 (two) times daily.    Marland Kitchen CLOPIDOGREL BISULFATE 75 MG PO TABS Oral Take 75 mg by mouth daily.    Marland Kitchen FERROUS SULFATE 325 (65 FE) MG PO TABS Oral Take 325 mg by mouth daily with breakfast.    . FUROSEMIDE 20 MG PO TABS Oral Take 20 mg by mouth every morning.    Marland Kitchen HYDRALAZINE HCL 10 MG PO TABS Oral Take 10 mg by mouth 2 (two) times daily.    Marland Kitchen LEVETIRACETAM 500 MG PO TABS Oral Take 500 mg by mouth 2 (two) times daily.    Marland Kitchen NITROFURANTOIN MACROCRYSTAL 100 MG PO CAPS Oral Take 100 mg by mouth at bedtime.    Marland Kitchen POTASSIUM CHLORIDE CRYS ER 10 MEQ PO TBCR Oral Take 10 mEq by mouth every other day. Take with food.    Marland Kitchen PROBIOTIC FORMULA PO Oral Take 1 tablet by mouth daily.      BP 191/92  Pulse 99  Temp(Src) 98.2 F (36.8 C) (Oral)  Resp 18  SpO2 98%  Physical Exam  Nursing note and vitals reviewed. Constitutional: She is oriented to person, place, and time. She appears well-developed and well-nourished. No distress.  HENT:  Head: Normocephalic and atraumatic.  Mouth/Throat: Oropharynx is clear and moist.  Eyes: Conjunctivae and EOM are normal. Pupils are equal, round, and reactive to light.  Neck: Normal range of motion. Neck supple.  Cardiovascular: Normal rate, regular rhythm and normal heart sounds.   Pulmonary/Chest: Effort normal and breath sounds normal.  Abdominal: Soft. Bowel sounds are normal. There is no tenderness.  Musculoskeletal: Normal range of motion. She exhibits tenderness.  Neurological: She is alert and oriented to person, place, and time. No cranial nerve deficit. She exhibits normal muscle tone. Coordination normal.        Significant for right great toe lateral part of nail bed with some dried blood no sub-ungual hematoma. No active bleeding no significant laceration. Good cap refill no significant tenderness. Old distal foot surgical incision well-healed from bunion surgery.  Skin: Skin is warm. No rash noted. No erythema.    ED Course  Procedures (including critical care time)  Labs Reviewed - No data to display No results found.   1. Injury of right toe       MDM    Bleeding stopped from right big toe no oozing or active bleeding at all no history of injury. Patient was walking around in her slippers at the time suspect that the nail that partially pulled by the shoe. Patient is on Plavix she is not on Coumadin or warfarin went over her bag of medicines therefore no blood levels to check. Bleeding as stated is currently under control good cap refill to the toe no hematoma under the nail bed. No requirement for x-ray since no significant injury.       Mervin Kung, MD 12/21/11 [phone]

## 2012-02-16 DIAGNOSIS — D649 Anemia, unspecified: Secondary | ICD-10-CM | POA: Diagnosis not present

## 2012-02-16 DIAGNOSIS — I6789 Other cerebrovascular disease: Secondary | ICD-10-CM | POA: Diagnosis not present

## 2012-02-16 DIAGNOSIS — I10 Essential (primary) hypertension: Secondary | ICD-10-CM | POA: Diagnosis not present

## 2012-02-19 DIAGNOSIS — I10 Essential (primary) hypertension: Secondary | ICD-10-CM | POA: Diagnosis not present

## 2012-02-19 DIAGNOSIS — D509 Iron deficiency anemia, unspecified: Secondary | ICD-10-CM | POA: Diagnosis not present

## 2012-05-15 DIAGNOSIS — I10 Essential (primary) hypertension: Secondary | ICD-10-CM | POA: Diagnosis not present

## 2012-06-18 DIAGNOSIS — I10 Essential (primary) hypertension: Secondary | ICD-10-CM | POA: Diagnosis not present

## 2012-07-31 ENCOUNTER — Other Ambulatory Visit: Payer: Self-pay | Admitting: Internal Medicine

## 2012-07-31 DIAGNOSIS — Z1231 Encounter for screening mammogram for malignant neoplasm of breast: Secondary | ICD-10-CM

## 2012-08-22 ENCOUNTER — Ambulatory Visit
Admission: RE | Admit: 2012-08-22 | Discharge: 2012-08-22 | Disposition: A | Discharge status: Home / Self Care | Payer: Medicare Other | Source: Ambulatory Visit | Attending: Internal Medicine | Admitting: Internal Medicine

## 2012-08-22 DIAGNOSIS — Z1231 Encounter for screening mammogram for malignant neoplasm of breast: Secondary | ICD-10-CM

## 2012-09-06 ENCOUNTER — Encounter: Payer: Self-pay | Admitting: Gastroenterology

## 2012-09-11 DIAGNOSIS — Z23 Encounter for immunization: Secondary | ICD-10-CM | POA: Diagnosis not present

## 2012-09-18 DIAGNOSIS — I10 Essential (primary) hypertension: Secondary | ICD-10-CM | POA: Diagnosis not present

## 2012-09-27 ENCOUNTER — Encounter: Payer: Self-pay | Admitting: Gastroenterology

## 2012-10-17 ENCOUNTER — Telehealth: Payer: Self-pay | Admitting: Gastroenterology

## 2012-10-17 NOTE — Telephone Encounter (Signed)
completed

## 2012-10-21 ENCOUNTER — Ambulatory Visit: Payer: Medicare Other | Admitting: Gastroenterology

## 2012-12-11 DIAGNOSIS — D126 Benign neoplasm of colon, unspecified: Secondary | ICD-10-CM

## 2012-12-11 HISTORY — DX: Benign neoplasm of colon, unspecified: D12.6

## 2013-01-16 DIAGNOSIS — R002 Palpitations: Secondary | ICD-10-CM | POA: Diagnosis not present

## 2013-01-20 DIAGNOSIS — R002 Palpitations: Secondary | ICD-10-CM | POA: Diagnosis not present

## 2013-01-20 DIAGNOSIS — I10 Essential (primary) hypertension: Secondary | ICD-10-CM | POA: Diagnosis not present

## 2013-01-20 DIAGNOSIS — R5383 Other fatigue: Secondary | ICD-10-CM | POA: Diagnosis not present

## 2013-01-22 ENCOUNTER — Ambulatory Visit: Payer: Medicare Other | Admitting: Nurse Practitioner

## 2013-01-22 ENCOUNTER — Encounter: Payer: Self-pay | Admitting: Nurse Practitioner

## 2013-01-22 ENCOUNTER — Ambulatory Visit (INDEPENDENT_AMBULATORY_CARE_PROVIDER_SITE_OTHER): Payer: Medicare Other | Admitting: Nurse Practitioner

## 2013-01-22 VITALS — BP 160/90 | HR 69 | Ht 59.0 in | Wt 187.0 lb

## 2013-01-22 DIAGNOSIS — R0989 Other specified symptoms and signs involving the circulatory and respiratory systems: Secondary | ICD-10-CM

## 2013-01-22 DIAGNOSIS — R0600 Dyspnea, unspecified: Secondary | ICD-10-CM

## 2013-01-22 DIAGNOSIS — R0609 Other forms of dyspnea: Secondary | ICD-10-CM

## 2013-01-22 LAB — CBC WITH DIFFERENTIAL/PLATELET
Basophils Absolute: 0 10*3/uL (ref 0.0–0.1)
Basophils Relative: 0.2 % (ref 0.0–3.0)
Eosinophils Absolute: 0.2 10*3/uL (ref 0.0–0.7)
Eosinophils Relative: 2.1 % (ref 0.0–5.0)
HCT: 33.9 % — ABNORMAL LOW (ref 36.0–46.0)
Hemoglobin: 11.3 g/dL — ABNORMAL LOW (ref 12.0–15.0)
Lymphocytes Relative: 22.1 % (ref 12.0–46.0)
Lymphs Abs: 2 10*3/uL (ref 0.7–4.0)
MCHC: 33.3 g/dL (ref 30.0–36.0)
MCV: 87.7 fl (ref 78.0–100.0)
Monocytes Absolute: 0.7 10*3/uL (ref 0.1–1.0)
Monocytes Relative: 7.5 % (ref 3.0–12.0)
Neutro Abs: 6.1 10*3/uL (ref 1.4–7.7)
Neutrophils Relative %: 68.1 % (ref 43.0–77.0)
Platelets: 315 10*3/uL (ref 150.0–400.0)
RBC: 3.86 Mil/uL — ABNORMAL LOW (ref 3.87–5.11)
RDW: 12.7 % (ref 11.5–14.6)
WBC: 8.9 10*3/uL (ref 4.5–10.5)

## 2013-01-22 LAB — BASIC METABOLIC PANEL
BUN: 22 mg/dL (ref 6–23)
CO2: 23 mEq/L (ref 19–32)
Calcium: 9.6 mg/dL (ref 8.4–10.5)
Chloride: 105 mEq/L (ref 96–112)
Creatinine, Ser: 1.3 mg/dL — ABNORMAL HIGH (ref 0.4–1.2)
GFR: 48.86 mL/min — ABNORMAL LOW (ref >60.00)
Glucose, Bld: 91 mg/dL (ref 70–99)
Potassium: 4.7 mEq/L (ref 3.5–5.1)
Sodium: 136 mEq/L (ref 135–145)

## 2013-01-22 LAB — BRAIN NATRIURETIC PEPTIDE: Pro B Natriuretic peptide (BNP): 58 pg/mL (ref 0.0–100.0)

## 2013-01-22 NOTE — Patient Instructions (Signed)
We are going to check labs today  We are going to update your ultrasound of your heart  Please monitor your blood pressure at home - keep a diary - goal is less than 140/90  I will see you in 3 weeks to review  Call the [place] office at [phone] if you have any questions, problems or concerns.

## 2013-01-22 NOTE — Progress Notes (Signed)
[name]: June 20, 1930 Medical Record [id number]  History of Present Illness: Ms. Goldring is seen back today for a work in visit. She is seen for [name]. She has a chronic LBBB, remote stress testing in 2006 which was normal, HTN, PVD with past renal artery stenosis, PAF (but no real documentation of this), iron deficient anemia, seizure disorder and prior subdural hematoma and a past GI bleed. She has had chronic chest pains and palpitations for many years noted in her chart. She had an echo back in 2010 showing normal LV function, mild LVH with mild hypertrophy and moderate obstruction of the outflow tract.   She was last seen in December of 2011.   She comes in today. She is here alone. She is doing ok. Had tomato soup for breakfast. She continues to have some fleeting, sharp chest pains. She says this is totally unchanged from she normally has. No exertional symptoms. Not very active. Has gained weight. No swelling. She is more concerned about being more short of breath with exertion. Says this is really why she is here. No cough. No swelling. BP at home she says is much better. Was 143/78 last night. Does sound like she uses too much salt. She has had her medicines about 2 hours ago prior to the visit.  Current Outpatient Prescriptions on File Prior to Visit  Medication Sig Dispense Refill  . aspirin EC 81 MG tablet Take 81 mg by mouth daily.      . benazepril (LOTENSIN) 20 MG tablet Take 20 mg by mouth daily.      . cloNIDine (CATAPRES) 0.1 MG tablet Take 0.1 mg by mouth 2 (two) times daily.      . clopidogrel (PLAVIX) 75 MG tablet Take 75 mg by mouth daily.      . ferrous sulfate 325 (65 FE) MG tablet Take 325 mg by mouth daily with breakfast.      . furosemide (LASIX) 20 MG tablet Take 40 mg by mouth every morning.       . hydrALAZINE (APRESOLINE) 10 MG tablet Take 10 mg by mouth 2 (two) times daily.      Marland Kitchen levETIRAcetam (KEPPRA) 500 MG tablet Take 500 mg by mouth  2 (two) times daily.      . nitrofurantoin (MACRODANTIN) 100 MG capsule Take 100 mg by mouth at bedtime.      . potassium chloride (K-DUR,KLOR-CON) 10 MEQ tablet Take 10 mEq by mouth every other day. Take with food.      . Probiotic Product (PROBIOTIC FORMULA PO) Take 1 tablet by mouth daily.       No current facility-administered medications on file prior to visit.    Allergies  Allergen Reactions  . Cephalexin Other (See Comments)    REACTION: Questionable, no reaction listed  . Haldol (Haloperidol Decanoate) Other (See Comments)    unknown  . Lorazepam Other (See Comments)    unknown    Past Medical History  Diagnosis Date  . Hypertension   . Internal hemorrhoids   . Diverticulosis   . Seizure disorder   . Arthritis   . Left bundle branch block   . Palpitation   . PVD (peripheral vascular disease)   . PAF (paroxysmal atrial fibrillation)   . Iron deficiency anemia   . Seizure disorder   . Personal history of subdural hematoma     Past Surgical History  Procedure Laterality Date  . Laparoscopic appendectomy    . Knee arthroscopy    .  Inguinal hernia repair    . Angioplasty  1999  . Laparoscopic cholecystectomy    . Carpal tunnel release    . Cataract extraction      History  Smoking status  . Never Smoker   Smokeless tobacco  . Not on file    History  Alcohol Use No    History reviewed. No pertinent family history.  Review of Systems: The review of systems is per the HPI.  All other systems were reviewed and are negative.  Physical Exam: BP 180/88  Pulse 69  Ht 4\' 11"  (1.499 m)  Wt 187 lb (84.823 kg)  BMI 37.75 kg/m2  SpO2 100% Patient is an elderly female who is very pleasant and in no acute distress. She is using a cane. Skin is warm and dry. Color is normal.  HEENT is unremarkable. Normocephalic/atraumatic. PERRL. Sclera are nonicteric. Neck is supple. No masses. No JVD. Lungs are clear. Cardiac exam shows a regular rate and rhythm. Abdomen is  soft. Extremities are without edema. Gait and ROM are intact. No gross neurologic deficits noted.   LABORATORY DATA: EKG today shows sinus rhythm. LBBB noted which is chronic.     Chemistry      Component Value Date/Time   NA 136 07/06/2010 0555   K 3.8 REPEATED TO VERIFY DELTA CHECK NOTED 07/06/2010 0555   CL 107 07/06/2010 0555   CO2 22 07/06/2010 0555   BUN 8 07/06/2010 0555   CREATININE 1.12 07/06/2010 0555      Component Value Date/Time   CALCIUM 8.9 07/06/2010 0555   ALKPHOS 53 07/05/2010 0745   AST 24 07/05/2010 0745   ALT 20 07/05/2010 0745   BILITOT <0.1* 07/05/2010 0745     Lab Results  Component Value Date   CHOL  Value: 127        ATP III CLASSIFICATION:  <200     mg/dL   Desirable  200-239  mg/dL   Borderline High  >=240    mg/dL   High        04/25/2010   HDL 49 04/25/2010   LDLCALC  Value: 67        Total Cholesterol/HDL:CHD Risk Coronary Heart Disease Risk Table                     Men   Women  1/2 Average Risk   3.4   3.3  Average Risk       5.0   4.4  2 X Average Risk   9.6   7.1  3 X Average Risk  23.4   11.0        Use the calculated Patient Ratio above and the CHD Risk Table to determine the patient's CHD Risk.        ATP III CLASSIFICATION (LDL):  <100     mg/dL   Optimal  100-129  mg/dL   Near or Above                    Optimal  130-159  mg/dL   Borderline  160-189  mg/dL   High  >190     mg/dL   Very High 04/25/2010   TRIG 56 04/25/2010   CHOLHDL 2.6 04/25/2010    Assessment / Plan: 1. Chest pain - very atypical - has been present for many years. No exertional symptoms reported.   2. HTN - BP is high here today. Had tomato soup prior to her visit. Says she  has better control at home. I have asked her to keep a diary and I will see her back to review. Has had prior RAS in the past and will need to keep this in mind.   3. HLD   4. Chronic LBBB  5. Worsening DOE - she has gained weight. Very inactive. Will check an echo and BNP today along with CBC and BMET. Further  disposition to follow.   I will see her back in about 3 weeks with her BP diary and will review her echo with her.   Patient is agreeable to this plan and will call if any problems develop in the interim.

## 2013-01-30 ENCOUNTER — Ambulatory Visit (HOSPITAL_COMMUNITY): Payer: Medicare Other | Attending: Cardiology | Admitting: Radiology

## 2013-01-30 DIAGNOSIS — I739 Peripheral vascular disease, unspecified: Secondary | ICD-10-CM | POA: Insufficient documentation

## 2013-01-30 DIAGNOSIS — R0609 Other forms of dyspnea: Secondary | ICD-10-CM | POA: Diagnosis not present

## 2013-01-30 DIAGNOSIS — R072 Precordial pain: Secondary | ICD-10-CM | POA: Diagnosis not present

## 2013-01-30 DIAGNOSIS — I447 Left bundle-branch block, unspecified: Secondary | ICD-10-CM | POA: Insufficient documentation

## 2013-01-30 DIAGNOSIS — I10 Essential (primary) hypertension: Secondary | ICD-10-CM | POA: Diagnosis not present

## 2013-01-30 DIAGNOSIS — I079 Rheumatic tricuspid valve disease, unspecified: Secondary | ICD-10-CM | POA: Insufficient documentation

## 2013-01-30 DIAGNOSIS — R0989 Other specified symptoms and signs involving the circulatory and respiratory systems: Secondary | ICD-10-CM | POA: Insufficient documentation

## 2013-01-30 NOTE — Progress Notes (Signed)
Echocardiogram performed.  

## 2013-02-14 ENCOUNTER — Ambulatory Visit: Payer: Medicare Other | Admitting: Nurse Practitioner

## 2013-02-18 ENCOUNTER — Encounter: Payer: Self-pay | Admitting: Nurse Practitioner

## 2013-02-18 ENCOUNTER — Ambulatory Visit (INDEPENDENT_AMBULATORY_CARE_PROVIDER_SITE_OTHER): Payer: Medicaid Other | Admitting: Nurse Practitioner

## 2013-02-18 VITALS — BP 190/88 | HR 68 | Ht <= 58 in | Wt 186.2 lb

## 2013-02-18 DIAGNOSIS — I10 Essential (primary) hypertension: Secondary | ICD-10-CM | POA: Diagnosis not present

## 2013-02-18 DIAGNOSIS — R0609 Other forms of dyspnea: Secondary | ICD-10-CM | POA: Diagnosis not present

## 2013-02-18 DIAGNOSIS — R0989 Other specified symptoms and signs involving the circulatory and respiratory systems: Secondary | ICD-10-CM | POA: Diagnosis not present

## 2013-02-18 DIAGNOSIS — R0600 Dyspnea, unspecified: Secondary | ICD-10-CM

## 2013-02-18 MED ORDER — HYDRALAZINE HCL 25 MG PO TABS: 25.0000 mg | ORAL_TABLET | Freq: Three times a day (TID) | ORAL | Status: DC

## 2013-02-18 NOTE — Patient Instructions (Addendum)
Try to avoid the salt  We are changing your Hydralazine to 25 mg three times a day - I have sent this prescription to the drug store.  I need to see you in about 10 days - with a later appointment  Bring your BP cuff with you for me to check  Call the [place] office at [phone] if you have any questions, problems or concerns.    1.5 Gram Low Sodium Diet A 1.5 gram sodium diet restricts the amount of sodium in the diet to no more than 1.5 g or 1500 mg daily. The American Heart Association recommends Americans over the age of 4 to consume no more than 1500 mg of sodium each day to reduce the risk of developing high blood pressure. Research also shows that limiting sodium may reduce heart attack and stroke risk. Many foods contain sodium for flavor and sometimes as a preservative. When the amount of sodium in a diet needs to be low, it is important to know what to look for when choosing foods and drinks. The following includes some information and guidelines to help make it easier for you to adapt to a low sodium diet. QUICK TIPS  Do not add salt to food.  Avoid convenience items and fast food.  Choose unsalted snack foods.  Buy lower sodium products, often labeled as "lower sodium" or "no salt added."  Check food labels to learn how much sodium is in 1 serving.  When eating at a restaurant, ask that your food be prepared with less salt or none, if possible. READING FOOD LABELS FOR SODIUM INFORMATION The nutrition facts label is a good place to find how much sodium is in foods. Look for products with no more than 400 mg of sodium per serving. Remember that 1.5 g = 1500 mg. The food label may also list foods as:  Sodium-free: Less than 5 mg in a serving.  Very low sodium: 35 mg or less in a serving.  Low-sodium: 140 mg or less in a serving.  Light in sodium: 50% less sodium in a serving. For example, if a food that usually has 300 mg of sodium is changed to become  light in sodium, it will have 150 mg of sodium.  Reduced sodium: 25% less sodium in a serving. For example, if a food that usually has 400 mg of sodium is changed to reduced sodium, it will have 300 mg of sodium. CHOOSING FOODS Grains  Avoid: Salted crackers and snack items. Some cereals, including instant hot cereals. Bread stuffing and biscuit mixes. Seasoned rice or pasta mixes.  Choose: Unsalted snack items. Low-sodium cereals, oats, puffed wheat and rice, shredded wheat. English muffins and bread. Pasta. Meats  Avoid: Salted, canned, smoked, spiced, pickled meats, including fish and poultry. Bacon, ham, sausage, cold cuts, hot dogs, anchovies.  Choose: Low-sodium canned tuna and salmon. Fresh or frozen meat, poultry, and fish. Dairy  Avoid: Processed cheese and spreads. Cottage cheese. Buttermilk and condensed milk. Regular cheese.  Choose: Milk. Low-sodium cottage cheese. Yogurt. Sour cream. Low-sodium cheese. Fruits and Vegetables  Avoid: Regular canned vegetables. Regular canned tomato sauce and paste. Frozen vegetables in sauces. Olives. Angie Fava. Relishes. Sauerkraut.  Choose: Low-sodium canned vegetables. Low-sodium tomato sauce and paste. Frozen or fresh vegetables. Fresh and frozen fruit. Condiments  Avoid: Canned and packaged gravies. Worcestershire sauce. Tartar sauce. Barbecue sauce. Soy sauce. Steak sauce. Ketchup. Onion, garlic, and table salt. Meat flavorings and tenderizers.  Choose: Fresh and dried herbs  and spices. Low-sodium varieties of mustard and ketchup. Lemon juice. Tabasco sauce. Horseradish. SAMPLE 1.5 GRAM SODIUM MEAL PLAN Breakfast / Sodium (mg)  1 cup low-fat milk / 143 mg  1 whole-wheat English muffin / 240 mg  1 tbs heart-healthy margarine / 153 mg  1 hard-boiled egg / 139 mg  1 small orange / 0 mg Lunch / Sodium (mg)  1 cup raw carrots / 76 mg  2 tbs no salt added peanut butter / 5 mg  2 slices whole-wheat bread / 270 mg  1 tbs jelly  / 6 mg   cup red grapes / 2 mg Dinner / Sodium (mg)  1 cup whole-wheat pasta / 2 mg  1 cup low-sodium tomato sauce / 73 mg  3 oz lean ground beef / 57 mg  1 small side salad (1 cup raw spinach leaves,  cup cucumber,  cup yellow bell pepper) with 1 tsp olive oil and 1 tsp red wine vinegar / 25 mg Snack / Sodium (mg)  1 container low-fat vanilla yogurt / 107 mg  3 graham cracker squares / 127 mg Nutrient Analysis  Calories: 1745  Protein: 75 g  Carbohydrate: 237 g  Fat: 57 g  Sodium: 1425 mg Document Released: 11/27/2005 Document Revised: 02/19/2012 Document Reviewed: 02/28/2010 [facility] Patient Information 2013 [employer].

## 2013-02-18 NOTE — Progress Notes (Signed)
[name]: 18-Aug-1930 Medical Record [id number]  History of Present Illness: Ms. Jain is seen back today for a one month check. She is seen for [name]. She has a chronic LBBB, remote stress testing in 2005 which was normal, HTN, PVD with past renal artery stenosis, PAF (but no real documentation of this apparently), iron deficiency anemia, seizure disorder and prior subdural hematoma and a past GI bleed. She has had chronic chest pain and palpitations for many years noted in her chart. Echo back in 2010 showed normal LV function, mild LVH with mild hypertrophy and moderate obstruction of the outflow tract.   She was seen a month ago. Had some atypical chest pain and DOE. BP was quite high. She was using lots of salt. We rechecked her echo.   She comes back today. She is here alone. She is doing ok. Echo showed severe LVH and she is felt to likely have diastolic dysfunction. This was felt to be the etiology of her dyspnea as well. BP remains high. She has not taken any of her medicines yet today. Still using some salt. She is using a wrist cuff. Never been checked. Says her BP at home today was 136/76.   Current Outpatient Prescriptions on File Prior to Visit  Medication Sig Dispense Refill  . aspirin EC 81 MG tablet Take 81 mg by mouth daily.      . benazepril (LOTENSIN) 20 MG tablet Take 20 mg by mouth daily.      . cloNIDine (CATAPRES) 0.1 MG tablet Take 0.1 mg by mouth 2 (two) times daily.      . clopidogrel (PLAVIX) 75 MG tablet Take 75 mg by mouth daily.      . ferrous sulfate 325 (65 FE) MG tablet Take 325 mg by mouth daily with breakfast.      . furosemide (LASIX) 20 MG tablet Take 40 mg by mouth every morning.       . levETIRAcetam (KEPPRA) 500 MG tablet Take 500 mg by mouth 2 (two) times daily.      . nitrofurantoin (MACRODANTIN) 100 MG capsule Take 100 mg by mouth at bedtime.      . potassium chloride (K-DUR,KLOR-CON) 10 MEQ tablet Take 10 mEq by mouth every  other day. Take with food.      . Probiotic Product (PROBIOTIC FORMULA PO) Take 1 tablet by mouth daily.       No current facility-administered medications on file prior to visit.    Allergies  Allergen Reactions  . Cephalexin Other (See Comments)    REACTION: Questionable, no reaction listed  . Haldol (Haloperidol Decanoate) Other (See Comments)    unknown  . Lorazepam Other (See Comments)    unknown    Past Medical History  Diagnosis Date  . Hypertension   . Internal hemorrhoids   . Diverticulosis   . Seizure disorder   . Arthritis   . Left bundle branch block   . Palpitation   . PVD (peripheral vascular disease)   . PAF (paroxysmal atrial fibrillation)   . Iron deficiency anemia   . Seizure disorder   . Personal history of subdural hematoma     Past Surgical History  Procedure Laterality Date  . Laparoscopic appendectomy    . Knee arthroscopy    . Inguinal hernia repair    . Angioplasty  1999  . Laparoscopic cholecystectomy    . Carpal tunnel release    . Cataract extraction  History  Smoking status  . Never Smoker   Smokeless tobacco  . Not on file    History  Alcohol Use No    History reviewed. No pertinent family history.  Review of Systems: The review of systems is per the HPI.  All other systems were reviewed and are negative.  Physical Exam: BP 190/88  Pulse 68  Ht 4\' 10"  (1.473 m)  Wt 186 lb 3.2 oz (84.46 kg)  BMI 38.93 kg/m2 Repeat BP by me is 190/90. Patient is very pleasant and in no acute distress. Skin is warm and dry. Color is normal.  HEENT is unremarkable. Normocephalic/atraumatic. PERRL. Sclera are nonicteric. Neck is supple. No masses. No JVD. Lungs are clear. Cardiac exam shows a regular rate and rhythm. Abdomen is soft. Extremities are without edema. Gait and ROM are intact. No gross neurologic deficits noted.  LABORATORY DATA:    Chemistry      Component Value Date/Time   NA 136 01/22/2013 1044   K 4.7 01/22/2013 1044     CL 105 01/22/2013 1044   CO2 23 01/22/2013 1044   BUN 22 01/22/2013 1044   CREATININE 1.3* 01/22/2013 1044      Component Value Date/Time   CALCIUM 9.6 01/22/2013 1044   ALKPHOS 53 07/05/2010 0745   AST 24 07/05/2010 0745   ALT 20 07/05/2010 0745   BILITOT <0.1* 07/05/2010 0745      Echo Study Conclusions  - Left ventricle: Abnormal septal motion. Small LV cavity Wall thickness was increased in a pattern of severe LVH. The estimated ejection fraction was 55%. - Aortic valve: Not well seen Mild AS Calcified Cannot tell if trileaflet or not Mean gradient: 49mm Hg (S). Peak gradient: 86mm Hg (S). - Pulmonary arteries: PA peak pressure: 57mm Hg (S).   Assessment / Plan:  1. HTN - BP is quite high. She has not taken any of her medicines today. I doubt that she has adequate control as well. Using a wrist cuff at home. I have changed her Hydralazine to 25 mg TID. Will see her back in 10 days. She needs to have a later appointment in the day and take her medicines.   2. Severe LVH on echo with likely diastolic dysfunction - this is felt to be the etiology of her dyspnea  3. Chronic LBBB  Patient is agreeable to this plan and will call if any problems develop in the interim.

## 2013-02-28 ENCOUNTER — Encounter: Payer: Self-pay | Admitting: Nurse Practitioner

## 2013-02-28 ENCOUNTER — Ambulatory Visit (INDEPENDENT_AMBULATORY_CARE_PROVIDER_SITE_OTHER): Payer: Medicaid Other | Admitting: Nurse Practitioner

## 2013-02-28 VITALS — BP 180/86 | HR 64 | Ht <= 58 in | Wt 184.8 lb

## 2013-02-28 DIAGNOSIS — I10 Essential (primary) hypertension: Secondary | ICD-10-CM | POA: Diagnosis not present

## 2013-02-28 NOTE — Patient Instructions (Addendum)
Keep riding your bike  Monitor your blood pressure at home - write your readings down and always bring to your doctor's visits  Stay on your current medicines  [name] will see you in 4 months  Call the [place] office at [phone] if you have any questions, problems or concerns.

## 2013-02-28 NOTE — Progress Notes (Signed)
[name]: [birth year] Medical Record [id number]  History of Present Illness: Jatasha is seen back today for a 10 day check. She is seen for [name]. She has a chronic LBBB, remote stress testing in 2005 which was normal, HTN, PVD with past renal artery stenosis, PAF (but with no real documentation of this apparently), iron deficiency anemia, seizure disorder and prior subdural hematoma and a past GI bleed. She has had chronic chest pain and palpitations for many years. Echo back in 2010 showed normal LV function, mild LVH with mild hypertrophy and moderate obstruction of the outflow track.   I saw her earlier this month. BP was quite high. We have adjusted her medicines and updated her echo. She has severe LVH and is felt to have diastolic dysfunction. Hydralazine was increased. She had not taken any of her medicines prior to her last visit with me.   She comes back today. She is here alone. Doing ok. BP still high here today. She brought her cuff in to check. Her readings in the memory are reviewed and for the most part look good. She feels ok and actually says she feels better. Riding her bike at home.    Current Outpatient Prescriptions on File Prior to Visit  Medication Sig Dispense Refill  . aspirin EC 81 MG tablet Take 81 mg by mouth daily.      . benazepril (LOTENSIN) 20 MG tablet Take 20 mg by mouth daily.      . cloNIDine (CATAPRES) 0.1 MG tablet Take 0.1 mg by mouth 2 (two) times daily.      . clopidogrel (PLAVIX) 75 MG tablet Take 75 mg by mouth daily.      . ferrous sulfate 325 (65 FE) MG tablet Take 325 mg by mouth daily with breakfast.      . furosemide (LASIX) 20 MG tablet Take 40 mg by mouth every morning.       . hydrALAZINE (APRESOLINE) 25 MG tablet Take 1 tablet (25 mg total) by mouth 3 (three) times daily.  90 tablet  3  . levETIRAcetam (KEPPRA) 500 MG tablet Take 500 mg by mouth 2 (two) times daily.      . nitrofurantoin (MACRODANTIN) 100 MG capsule  Take 100 mg by mouth at bedtime.      . potassium chloride (K-DUR,KLOR-CON) 10 MEQ tablet Take 10 mEq by mouth every other day. Take with food.      . Probiotic Product (PROBIOTIC FORMULA PO) Take 1 tablet by mouth daily.       No current facility-administered medications on file prior to visit.    Allergies  Allergen Reactions  . Cephalexin Other (See Comments)    REACTION: Questionable, no reaction listed  . Haldol (Haloperidol Decanoate) Other (See Comments)    unknown  . Lorazepam Other (See Comments)    unknown    Past Medical History  Diagnosis Date  . Hypertension   . Internal hemorrhoids   . Diverticulosis   . Seizure disorder   . Arthritis   . Left bundle branch block   . Palpitation   . PVD (peripheral vascular disease)   . PAF (paroxysmal atrial fibrillation)   . Iron deficiency anemia   . Seizure disorder   . Personal history of subdural hematoma     Past Surgical History  Procedure Laterality Date  . Laparoscopic appendectomy    . Knee arthroscopy    . Inguinal hernia repair    . Angioplasty  1999  .  Laparoscopic cholecystectomy    . Carpal tunnel release    . Cataract extraction      History  Smoking status  . Never Smoker   Smokeless tobacco  . Not on file    History  Alcohol Use No    No family history on file.  Review of Systems: The review of systems is per the HPI.  All other systems were reviewed and are negative.  Physical Exam: BP 180/86  Pulse 64  Ht 4\' 10"  (1.473 m)  Wt 184 lb 12.8 oz (83.825 kg)  BMI 38.63 kg/m2 Patient is very pleasant and in no acute distress. Weight is down 2 pounds. She remains obese. Her BP cuff correlates nicely with ours today.  Skin is warm and dry. Color is normal.  HEENT is unremarkable. Normocephalic/atraumatic. PERRL. Sclera are nonicteric. Neck is supple. No masses. No JVD. Lungs are clear. Cardiac exam shows a regular rate and rhythm. Abdomen is soft. Extremities are without edema. Gait and ROM  are intact. No gross neurologic deficits noted.  LABORATORY DATA:   Chemistry      Component Value Date/Time   NA 136 01/22/2013 1044   K 4.7 01/22/2013 1044   CL 105 01/22/2013 1044   CO2 23 01/22/2013 1044   BUN 22 01/22/2013 1044   CREATININE 1.3* 01/22/2013 1044      Component Value Date/Time   CALCIUM 9.6 01/22/2013 1044   ALKPHOS 53 07/05/2010 0745   AST 24 07/05/2010 0745   ALT 20 07/05/2010 0745   BILITOT <0.1* 07/05/2010 0745     Lab Results  Component Value Date   WBC 8.9 01/22/2013   HGB 11.3* 01/22/2013   HCT 33.9* 01/22/2013   MCV 87.7 01/22/2013   PLT 315.0 01/22/2013   Echo Study Conclusions February 2014  - Left ventricle: Abnormal septal motion. Small LV cavity Wall thickness was increased in a pattern of severe LVH. The estimated ejection fraction was 55%. - Aortic valve: Not well seen Mild AS Calcified Cannot tell if trileaflet or not Mean gradient: 66mm Hg (S). Peak gradient: 38mm Hg (S). - Pulmonary arteries: PA peak pressure: 61mm Hg (S).   Assessment / Plan: 1. HTN - BP looks better at home. Her cuff correlates. I have left her on her current regimen. She will continue to monitor at home and keep a diary and bring this to all her appointments. We will see her back in about 4 months.   2. Severe LVH on echo with likely diastolic dysfunction - this is felt to be the etiology. She appears stable at this time.   3. LBBB - this is chronic.   Patient is agreeable to this plan and will call if any problems develop in the interim.   Burtis Junes, RN, ANP-C [facility] [address] [place], [place]  [address]

## 2013-06-08 ENCOUNTER — Other Ambulatory Visit: Payer: Self-pay | Admitting: Nurse Practitioner

## 2013-06-11 DIAGNOSIS — I10 Essential (primary) hypertension: Secondary | ICD-10-CM | POA: Diagnosis not present

## 2013-06-11 DIAGNOSIS — R002 Palpitations: Secondary | ICD-10-CM | POA: Diagnosis not present

## 2013-06-16 DIAGNOSIS — R635 Abnormal weight gain: Secondary | ICD-10-CM | POA: Diagnosis not present

## 2013-06-16 DIAGNOSIS — I10 Essential (primary) hypertension: Secondary | ICD-10-CM | POA: Diagnosis not present

## 2013-06-17 ENCOUNTER — Encounter: Payer: Self-pay | Admitting: Gastroenterology

## 2013-06-18 ENCOUNTER — Ambulatory Visit (HOSPITAL_COMMUNITY)
Admission: RE | Admit: 2013-06-18 | Discharge: 2013-06-18 | Disposition: A | Discharge status: Home / Self Care | Payer: Medicare Other | Source: Ambulatory Visit | Attending: Internal Medicine | Admitting: Internal Medicine

## 2013-06-18 ENCOUNTER — Other Ambulatory Visit (HOSPITAL_COMMUNITY): Payer: Self-pay | Admitting: *Deleted

## 2013-06-18 DIAGNOSIS — G40909 Epilepsy, unspecified, not intractable, without status epilepticus: Secondary | ICD-10-CM | POA: Diagnosis not present

## 2013-06-18 DIAGNOSIS — I447 Left bundle-branch block, unspecified: Secondary | ICD-10-CM | POA: Diagnosis not present

## 2013-06-18 DIAGNOSIS — I10 Essential (primary) hypertension: Secondary | ICD-10-CM | POA: Insufficient documentation

## 2013-06-18 DIAGNOSIS — M129 Arthropathy, unspecified: Secondary | ICD-10-CM | POA: Insufficient documentation

## 2013-06-18 DIAGNOSIS — K5730 Diverticulosis of large intestine without perforation or abscess without bleeding: Secondary | ICD-10-CM | POA: Insufficient documentation

## 2013-06-18 DIAGNOSIS — I4891 Unspecified atrial fibrillation: Secondary | ICD-10-CM | POA: Insufficient documentation

## 2013-06-18 DIAGNOSIS — D509 Iron deficiency anemia, unspecified: Secondary | ICD-10-CM | POA: Diagnosis not present

## 2013-06-18 DIAGNOSIS — I739 Peripheral vascular disease, unspecified: Secondary | ICD-10-CM | POA: Diagnosis not present

## 2013-06-18 DIAGNOSIS — Z8679 Personal history of other diseases of the circulatory system: Secondary | ICD-10-CM | POA: Insufficient documentation

## 2013-06-18 MED ORDER — ACETAMINOPHEN 325 MG PO TABS: 650.0000 mg | ORAL_TABLET | Freq: Once | ORAL | Status: AC

## 2013-06-18 MED ORDER — ACETAMINOPHEN 325 MG PO TABS
ORAL_TABLET | ORAL | Status: AC
  Administered 2013-06-18: 650 mg via ORAL
  Filled 2013-06-18: qty 2

## 2013-06-18 MED ORDER — DIPHENHYDRAMINE HCL 25 MG PO CAPS
ORAL_CAPSULE | ORAL | Status: AC
  Filled 2013-06-18: qty 1

## 2013-06-18 MED ORDER — DIPHENHYDRAMINE HCL 25 MG PO CAPS
25.0000 mg | ORAL_CAPSULE | Freq: Once | ORAL | Status: AC
  Administered 2013-06-18: 25 mg via ORAL

## 2013-06-19 LAB — TYPE AND SCREEN
ABO/RH(D): A POS
Antibody Screen: NEGATIVE
Donor AG Type: NEGATIVE
Unit division: 0
Unit division: 0

## 2013-06-23 ENCOUNTER — Ambulatory Visit (INDEPENDENT_AMBULATORY_CARE_PROVIDER_SITE_OTHER): Payer: Medicare Other | Admitting: Cardiovascular Disease

## 2013-06-23 ENCOUNTER — Encounter: Payer: Self-pay | Admitting: Cardiovascular Disease

## 2013-06-23 VITALS — BP 164/86 | HR 59 | Ht 62.0 in | Wt 184.0 lb

## 2013-06-23 DIAGNOSIS — I5189 Other ill-defined heart diseases: Secondary | ICD-10-CM

## 2013-06-23 DIAGNOSIS — I519 Heart disease, unspecified: Secondary | ICD-10-CM

## 2013-06-23 NOTE — Patient Instructions (Addendum)
Your physician wants you to follow-up in: 6 MONTHS.  You will receive a reminder letter in the mail two months in advance. If you don't receive a letter, please call our office to schedule the follow-up appointment.  Your physician recommends that you continue on your current medications as directed. Please refer to the Current Medication list given to you today.  

## 2013-06-23 NOTE — Progress Notes (Addendum)
[name] End Date of Birth: 12/07/1930 Medical Record [id number]   Problem List 1. LBBB 2.  Diastolic dysfunction 3. Anemia - received 2 units of PRBC last 4. HTN 5, PVD - renal artery stenosis   History of Present Illness: [name] is seen back today for a 10 day check.    She has a chronic LBBB, remote stress testing in 2005 which was normal, HTN, PVD with past renal artery stenosis, PAF (but with no real documentation of this apparently), iron deficiency anemia, seizure disorder and prior subdural hematoma and a past GI bleed. She has had chronic chest pain and palpitations for many years. Echo back in 2010 showed normal LV function, mild LVH with mild hypertrophy and moderate obstruction of the outflow track.   I saw her earlier this month. BP was quite high. We have adjusted her medicines and updated her echo. She has severe LVH and is felt to have diastolic dysfunction. Hydralazine was increased. She had not taken any of her medicines prior to her last visit with me.   She comes back today. She is here alone. Doing ok. BP still high here today. She brought her cuff in to check. Her readings in the memory are reviewed and for the most part look good. She feels ok and actually says she feels better. Riding her bike at home.   June 23, 2013:  [name] was last seen by me in 2011.  She was seen by [name] in March 2014.    She is breathing OK.  She continues to have problems with anemia and is breathing  better since she had her transfusion.  No CP .  She has white coat HTN.  She does not add salt.      She was accompanied by family.       Current Outpatient Prescriptions on File Prior to Visit  Medication Sig Dispense Refill  . aspirin EC 81 MG tablet Take 81 mg by mouth daily.      . benazepril (LOTENSIN) 20 MG tablet Take 20 mg by mouth daily.      . cloNIDine (CATAPRES) 0.1 MG tablet Take 0.1 mg by mouth 2 (two) times daily.      . clopidogrel (PLAVIX) 75 MG tablet Take 75 mg by  mouth daily.      . ferrous sulfate 325 (65 FE) MG tablet Take 325 mg by mouth daily with breakfast.      . furosemide (LASIX) 20 MG tablet Take 40 mg by mouth every morning.       . hydrALAZINE (APRESOLINE) 25 MG tablet TAKE 1 TABLET BY MOUTH THREE TIMES DAILY  90 tablet  2  . levETIRAcetam (KEPPRA) 500 MG tablet Take 500 mg by mouth 2 (two) times daily.      . nitrofurantoin (MACRODANTIN) 100 MG capsule Take 100 mg by mouth at bedtime.      . potassium chloride (K-DUR,KLOR-CON) 10 MEQ tablet Take 10 mEq by mouth every other day. Take with food.      . Probiotic Product (PROBIOTIC FORMULA PO) Take 1 tablet by mouth daily.       No current facility-administered medications on file prior to visit.    Allergies  Allergen Reactions  . Cephalexin Other (See Comments)    REACTION: Questionable, no reaction listed  . Haldol (Haloperidol Decanoate) Other (See Comments)    unknown  . Lorazepam Other (See Comments)    unknown    Past Medical History  Diagnosis Date  .  Hypertension   . Internal hemorrhoids   . Diverticulosis   . Seizure disorder   . Arthritis   . Left bundle branch block   . Palpitation   . PVD (peripheral vascular disease)   . PAF (paroxysmal atrial fibrillation)   . Iron deficiency anemia   . Seizure disorder   . Personal history of subdural hematoma     Past Surgical History  Procedure Laterality Date  . Laparoscopic appendectomy    . Knee arthroscopy    . Inguinal hernia repair    . Angioplasty  1999  . Laparoscopic cholecystectomy    . Carpal tunnel release    . Cataract extraction      History  Smoking status  . Never Smoker   Smokeless tobacco  . Not on file    History  Alcohol Use No    No family history on file.  Review of Systems: The review of systems is per the HPI.  All other systems were reviewed and are negative.  Physical Exam: BP 164/86  Pulse 59  Ht 5\' 2"  (1.575 m)  Wt 184 lb (83.462 kg)  BMI 33.65 kg/m2 Patient is very  pleasant and in no acute distress. Weight is down 2 pounds. She remains obese. Her BP cuff correlates nicely with ours today.  Skin is warm and dry. Color is normal.  HEENT is unremarkable. Normocephalic/atraumatic. PERRL. Sclera are nonicteric. Neck is supple. No masses. No JVD. Lungs are clear. Cardiac exam shows a regular rate and rhythm. Abdomen is soft. Extremities are without edema. Gait and ROM are intact. No gross neurologic deficits noted.  LABORATORY DATA:   Chemistry      Component Value Date/Time   NA 136 01/22/2013 1044   K 4.7 01/22/2013 1044   CL 105 01/22/2013 1044   CO2 23 01/22/2013 1044   BUN 22 01/22/2013 1044   CREATININE 1.3* 01/22/2013 1044      Component Value Date/Time   CALCIUM 9.6 01/22/2013 1044   ALKPHOS 53 07/05/2010 0745   AST 24 07/05/2010 0745   ALT 20 07/05/2010 0745   BILITOT <0.1* 07/05/2010 0745     Lab Results  Component Value Date   WBC 8.9 01/22/2013   HGB 11.3* 01/22/2013   HCT 33.9* 01/22/2013   MCV 87.7 01/22/2013   PLT 315.0 01/22/2013   Echo Study Conclusions February 2014  - Left ventricle: Abnormal septal motion. Small LV cavity Wall thickness was increased in a pattern of severe LVH. The estimated ejection fraction was 55%. - Aortic valve: Not well seen Mild AS Calcified Cannot tell if trileaflet or not Mean gradient: 52mm Hg (S). Peak gradient: 73mm Hg (S). - Pulmonary arteries: PA peak pressure: 81mm Hg (S).   Assessment / Plan:

## 2013-06-23 NOTE — Assessment & Plan Note (Signed)
[name] seems to be doing okay. She has severe left ventricular hypertrophy with subsequent diastolic dysfunction. Despite this, she seems to be doing fairly well.  I have once again cautioned her about eating any extra salt.  I'll see her back in the office in 6 months for followup visit.

## 2013-07-03 ENCOUNTER — Ambulatory Visit: Payer: Medicare Other | Admitting: Gastroenterology

## 2013-07-09 ENCOUNTER — Ambulatory Visit (INDEPENDENT_AMBULATORY_CARE_PROVIDER_SITE_OTHER): Payer: Medicare Other | Admitting: Physician Assistant

## 2013-07-09 ENCOUNTER — Encounter: Payer: Self-pay | Admitting: Physician Assistant

## 2013-07-09 ENCOUNTER — Other Ambulatory Visit (INDEPENDENT_AMBULATORY_CARE_PROVIDER_SITE_OTHER): Payer: Medicare Other

## 2013-07-09 VITALS — BP 160/80 | HR 72 | Ht 59.5 in | Wt 183.0 lb

## 2013-07-09 DIAGNOSIS — D649 Anemia, unspecified: Secondary | ICD-10-CM | POA: Diagnosis not present

## 2013-07-09 LAB — IRON AND TIBC
%SAT: 5 % — ABNORMAL LOW (ref 20–55)
TIBC: 407 ug/dL (ref 250–470)

## 2013-07-09 LAB — IRON: Iron: 25 ug/dL — ABNORMAL LOW (ref 42–145)

## 2013-07-09 LAB — CBC WITH DIFFERENTIAL/PLATELET
Basophils Relative: 0.3 % (ref 0.0–3.0)
Eosinophils Absolute: 0.2 10*3/uL (ref 0.0–0.7)
Eosinophils Relative: 2 % (ref 0.0–5.0)
Hemoglobin: 10.6 g/dL — ABNORMAL LOW (ref 12.0–15.0)
Lymphocytes Relative: 19.3 % (ref 12.0–46.0)
MCHC: 32.6 g/dL (ref 30.0–36.0)
Monocytes Relative: 6.5 % (ref 3.0–12.0)
Neutro Abs: 6.2 10*3/uL (ref 1.4–7.7)
RBC: 3.9 Mil/uL (ref 3.87–5.11)
WBC: 8.7 10*3/uL (ref 4.5–10.5)

## 2013-07-09 MED ORDER — MOVIPREP 100 G PO SOLR: 1.0000 | Freq: Once | ORAL | Status: DC

## 2013-07-09 NOTE — Patient Instructions (Addendum)
Please go to the basement level to have your labs drawn.   We sent the prescription for the colonoscopy prep to [facility].  Dennis Bast have been scheduled for an endoscopy and colonoscopy with propofol. Please follow the written instructions given to you at your visit today. Please pick up your prep at the pharmacy within the next 1-3 days. If you use inhalers (even only as needed), please bring them with you on the day of your procedure. Your physician has requested that you go to www.startemmi.com and enter the access code given to you at your visit today. This web site gives a general overview about your procedure. However, you should still follow specific instructions given to you by our office regarding your preparation for the procedure.

## 2013-07-09 NOTE — Progress Notes (Signed)
Subjective:    Patient ID: [name], female    DOB: 1930/03/11, 79 y.o.   MRN: [id number]  HPI  [name] is a pleasant 79 year old African American female who is referred today by Dr. Jana Hakim for evaluation of severe anemia. Patient is known remotely to Dr. Fuller Plan from colonoscopy done in 2002 this showed internal hemorrhoids and sigmoid diverticulosis. Records show that she also had a colonoscopy in 2008 per Dr. Collene Mares and had ischemic colitis at that time. Patient has history of peripheral vascular disease with renal artery stenosis, hypertension, bundle branch block, seizure disorder and previous subdural hematoma. She has severe left ventricular hypertrophy but most recent echo showed EF of 55%. Patient had been maintained on aspirin and Plavix as states that Dr. Arnoldo Morale stopped the Plavix earlier this month when she was found to be anemic. She continues on a baby aspirin daily. She is also on chronic Prilosec. Patient had been seen in early July and at that time had been been complaining of fatigue and exertional dyspnea. Labs were done and showed hemoglobin of 6.6 hematocrit of 20.5 MCV of 82. It was arranged for her to have blood transfusions which were done on 06/27/2013. She says since then she has felt better and denies any weakness dizziness or shortness of breath. Review of her records shows that in February of 2014 she had hemoglobin 11.3 hematocrit of 33.9 with MCV of 87 Has not had any repeat labs done since her transfusions She has no current complaints of abdominal pain, no changes in her bowel habits melena or hematochezia. She had been taking in iron supplement at one point but says she has not been on anything recently. He has no complaints of nausea vomiting dysphagia or odynophagia. Her appetite has been fine and her weight has been stable. Her only GI complaint is gas and a lot of gurgling in her abdomen. There is no family history of colon cancer polyps that she is  aware.    Review of Systems  Constitutional: Negative.   HENT: Negative.   Eyes: Negative.   Respiratory: Negative.   Cardiovascular: Negative.   Gastrointestinal: Negative.   Endocrine: Negative.   Genitourinary: Negative.   Musculoskeletal: Negative.   Skin: Negative.   Allergic/Immunologic: Negative.   Neurological: Negative.   Hematological: Bruises/bleeds easily.  Psychiatric/Behavioral: Negative.    Outpatient Prescriptions Prior to Visit  Medication Sig Dispense Refill  . aspirin EC 81 MG tablet Take 81 mg by mouth daily.      . benazepril (LOTENSIN) 20 MG tablet Take 20 mg by mouth daily.      . cloNIDine (CATAPRES) 0.1 MG tablet Take 0.1 mg by mouth 2 (two) times daily.      . furosemide (LASIX) 20 MG tablet Take 40 mg by mouth every morning.       . hydrALAZINE (APRESOLINE) 25 MG tablet TAKE 1 TABLET BY MOUTH THREE TIMES DAILY  90 tablet  2  . levETIRAcetam (KEPPRA) 500 MG tablet Take 500 mg by mouth 2 (two) times daily.      . nitrofurantoin (MACRODANTIN) 100 MG capsule Take 100 mg by mouth at bedtime.      Marland Kitchen omeprazole (PRILOSEC) 20 MG capsule TAKE ONE TWICE DAILY      . potassium chloride (K-DUR,KLOR-CON) 10 MEQ tablet Take 10 mEq by mouth every other day. Take with food.      . Probiotic Product (PROBIOTIC FORMULA PO) Take 1 tablet by mouth daily.      Marland Kitchen  simvastatin (ZOCOR) 40 MG tablet TAKE ONE TABLET AT BEDTIME      . clopidogrel (PLAVIX) 75 MG tablet Take 75 mg by mouth daily.      . ferrous sulfate 325 (65 FE) MG tablet Take 325 mg by mouth daily with breakfast.       No facility-administered medications prior to visit.   Allergies  Allergen Reactions  . Cephalexin Other (See Comments)    REACTION: Questionable, no reaction listed  . Haldol (Haloperidol Decanoate) Other (See Comments)    unknown  . Lorazepam Other (See Comments)    unknown   Patient Active Problem List   Diagnosis Date Noted  . Diastolic dysfunction 123456  . URI 12/01/2009  .  NAUSEA 07/23/2009  . FOOT PAIN, CHRONIC 03/17/2009  . MIXED INCONTINENCE URGE AND STRESS 01/20/2009  . SINUSITIS 01/06/2009  . BREAST PAIN, LEFT 02/14/2008  . SEIZURE DISORDER 02/05/2008  . CHRONIC KIDNEY DISEASE UNSPECIFIED 01/15/2008  . ABNORMALITY OF GAIT 01/15/2008  . CANDIDIASIS, SKIN 11/12/2007  . TINEA CORPORIS 10/22/2007  . HYPONATREMIA, MILD 06/22/2007  . ANXIETY STATE NOS 06/22/2007  . CATARACT NOS 06/22/2007  . BENIGN POSITIONAL VERTIGO 06/22/2007  . HYPERTENSION 06/22/2007  . INGUINAL HERNIA, RIGHT 06/22/2007  . CERVICAL POLYP 06/22/2007  . OSTEOARTHRITIS, GENERALIZED, MULTIPLE JOINTS 06/22/2007  . ABNORMAL MAMMOGRAM 06/22/2007  . FX CLOSED HUMERUS, ANATOMICAL NECK 06/22/2007  . HIP REPLACEMENT, TOTAL, HX OF 06/22/2007  . TOTAL KNEE REPLACEMENT, LEFT, HX OF 06/22/2007  . ARTHROSCOPY, HX OF 06/22/2007  . APPENDECTOMY, HX OF 06/22/2007  . TOTAL KNEE REPLACEMENT, HX OF 06/22/2007   History  Substance Use Topics  . Smoking status: Never Smoker   . Smokeless tobacco: Never Used  . Alcohol Use: No   family history is not on file.     Objective:   Physical Exam  well-developed elderly African American female in no acute distress, pleasant blood pressure 160/80 pulse 72 height 4 foot 11 weight 183 BMI of 37. HEENT; nontraumatic normocephalic EOMI PERRLA sclera anicteric, Supple; no JVD, Cardiovascular; regular rate and rhythm with S1-S2 there is a systolic murmur, Pulmonary; clear bilaterally, Abdomen; large soft nondistended nontender there is no palpable mass or hepatosplenomegaly, Rectal; exam Brown Hemoccult negative stool, Extremities ;no clubbing cyanosis or edema skin warm and dry, Psych; mood and affect normal and appropriate        Assessment & Plan:  #42  79 year old female with new severe normocytic anemia that is post blood transfusion x2 06/27/2013 . She is currently Hemoccult negative but has been off Plavix over the past 3 weeks Etiology of her anemia is  not clear -will need to rule out occult GI blood loss Rule out iron deficiency #2 chronic antiplatelet therapy with Plavix and aspirin-Plavix on hold x3 week #3 peripheral vascular disease with renal artery stenosis #4 hypertension #5 seizure disorder and history of previous subdural hematoma #6 left ventricular hypertrophy #7 history of ischemic colitis 2008 #8 diverticulosis Plan; Will repeat CBC with differential today and also check anemia panel to determine if she requires iron replacement Continue Prilosec 20 mg by mouth twice daily Patient will stay off of Plavix until after endoscopic evaluation Will schedule for colonoscopy and upper endoscopy with Dr. Josiah Lobo were discussed in detail with the patient and she is agreeable to proceed

## 2013-07-09 NOTE — Progress Notes (Signed)
Reviewed and agree with management plan.  Alexys Lobello T. Quint Chestnut, MD FACG 

## 2013-07-10 ENCOUNTER — Other Ambulatory Visit: Payer: Self-pay | Admitting: Physician Assistant

## 2013-07-10 MED ORDER — POLYSACCHARIDE IRON COMPLEX 150 MG PO CAPS: 150.0000 mg | ORAL_CAPSULE | Freq: Two times a day (BID) | ORAL | Status: DC

## 2013-08-12 ENCOUNTER — Encounter: Payer: Self-pay | Admitting: Gastroenterology

## 2013-08-12 ENCOUNTER — Ambulatory Visit (AMBULATORY_SURGERY_CENTER): Payer: Medicare Other | Admitting: Gastroenterology

## 2013-08-12 VITALS — BP 176/75 | HR 54 | Temp 97.5°F | Resp 29 | Ht 59.5 in | Wt 183.0 lb

## 2013-08-12 DIAGNOSIS — E669 Obesity, unspecified: Secondary | ICD-10-CM | POA: Diagnosis not present

## 2013-08-12 DIAGNOSIS — D509 Iron deficiency anemia, unspecified: Secondary | ICD-10-CM | POA: Diagnosis not present

## 2013-08-12 DIAGNOSIS — K222 Esophageal obstruction: Secondary | ICD-10-CM

## 2013-08-12 DIAGNOSIS — I10 Essential (primary) hypertension: Secondary | ICD-10-CM | POA: Diagnosis not present

## 2013-08-12 DIAGNOSIS — D649 Anemia, unspecified: Secondary | ICD-10-CM | POA: Diagnosis not present

## 2013-08-12 DIAGNOSIS — D126 Benign neoplasm of colon, unspecified: Secondary | ICD-10-CM

## 2013-08-12 DIAGNOSIS — G40909 Epilepsy, unspecified, not intractable, without status epilepticus: Secondary | ICD-10-CM | POA: Diagnosis not present

## 2013-08-12 DIAGNOSIS — I739 Peripheral vascular disease, unspecified: Secondary | ICD-10-CM | POA: Diagnosis not present

## 2013-08-12 DIAGNOSIS — I428 Other cardiomyopathies: Secondary | ICD-10-CM | POA: Diagnosis not present

## 2013-08-12 MED ORDER — SODIUM CHLORIDE 0.9 % IV SOLN: 500.0000 mL | INTRAVENOUS | Status: DC

## 2013-08-12 NOTE — Progress Notes (Signed)
Procedure ends, to recovery, report given to Nyra Capes, RN and VSS.

## 2013-08-12 NOTE — Progress Notes (Signed)
Called to room to assist during endoscopic procedure.  Patient ID and intended procedure confirmed with present staff. Received instructions for my participation in the procedure from the performing physician.  

## 2013-08-12 NOTE — Progress Notes (Signed)
Patient did not have preoperative order for IV antibiotic SSI prophylaxis. (G8918)  Patient did not experience any of the following events: a burn prior to discharge; a fall within the facility; wrong site/side/patient/procedure/implant event; or a hospital transfer or hospital admission upon discharge from the facility. (G8907)  

## 2013-08-12 NOTE — Op Note (Signed)
[facility]  [address]. [place], [id number]   ENDOSCOPY PROCEDURE REPORT  PATIENT: [name], [name]  MR#: [id number] BIRTHDATE: 1930/11/03 , 49  yrs. old GENDER: Female ENDOSCOPIST: Ladene Artist, MD, Marval Regal REFERRED BY:  Jana Hakim, M.D. PROCEDURE DATE:  08/12/2013 PROCEDURE:  EGD, diagnostic ASA CLASS:     Class III INDICATIONS:  Iron deficiency anemia. MEDICATIONS: There was residual sedation effect present from prior procedure, MAC sedation, administered by CRNA, propofol (Diprivan) 150mg  IV TOPICAL ANESTHETIC: none DESCRIPTION OF PROCEDURE: After the risks benefits and alternatives of the procedure were thoroughly explained, informed consent was obtained.  The LB [id number] endoscope was introduced through the mouth and advanced to the second portion of the duodenum  without limitations.  The instrument was slowly withdrawn as the mucosa was fully examined.  ESOPHAGUS: A stricture was found at the gastroesophageal junction. The stenosis was traversable with the endoscope.   The esophagus was otherwise normal. STOMACH: The mucosa and folds of the stomach appeared normal. DUODENUM: The duodenal mucosa showed no abnormalities in the bulb and second portion of the duodenum.  Retroflexed views revealed a 6 cm hiatal hernia.  No Camerson erosions noted. The scope was then withdrawn from the patient and the procedure completed.  COMPLICATIONS: There were no complications. ENDOSCOPIC IMPRESSION: 1.   Stricture at the gastroesophageal junction 2.   Hiatal hernia, 6 cm  RECOMMENDATIONS: 1.  Anti-reflux regimen 2.  Continue PPI 3.  Follow up with her PCP as planned   eSigned:  Ladene Artist, MD, [facility] 08/12/2013 1:52 PM

## 2013-08-12 NOTE — Op Note (Signed)
[place]  [address] [place], [id number]   COLONOSCOPY PROCEDURE REPORT PATIENT: [name], [name]  MR#: [id number] BIRTHDATE: 1930-09-25 , 85  yrs. old GENDER: Female ENDOSCOPIST: Ladene Artist, MD, [facility] REFERRED [id number] Arnoldo Morale, M.D. PROCEDURE DATE:  08/12/2013 PROCEDURE:   Colonoscopy with snare polypectomy First Screening Colonoscopy - Avg.  risk and is 50 yrs.  old or older - No.  Prior Negative Screening - Now for repeat screening. N/A  History of Adenoma - Now for follow-up colonoscopy & has been > or = to 3 yrs.  N/A  Polyps Removed Today? Yes. ASA CLASS:   Class III INDICATIONS:Iron Deficiency Anemia. MEDICATIONS: MAC sedation, administered by CRNA and propofol (Diprivan) 180mg  IV DESCRIPTION OF PROCEDURE:   After the risks benefits and alternatives of the procedure were thoroughly explained, informed consent was obtained.  A digital rectal exam revealed no abnormalities of the rectum.   The LB [id number]  endoscope was introduced through the anus and advanced to the cecum, which was identified by both the appendix and ileocecal valve. No adverse events experienced.   The quality of the prep was excellent, using MoviPrep  The instrument was then slowly withdrawn as the colon was fully examined.  COLON FINDINGS: 2 AVMs were noted at the cecum and hepatic flexure measuring 3 and 5 mm respectively.  No bleeding was noted. Moderate diverticulosis was noted at the cecum, in the ascending colon, descending colon, and sigmoid colon.   A sessile polyp measuring 7 mm in size was found in the sigmoid colon.  A polypectomy was performed using snare cautery.  The resection was complete and the polyp tissue was completely retrieved.   The colon was otherwise normal.  There was no diverticulosis, inflammation, polyps or cancers unless previously stated.  Retroflexed views revealed no abnormalities. The time to cecum=2 minutes 32 seconds. Withdrawal  time=11 minutes 41 seconds.  The scope was withdrawn and the procedure completed. COMPLICATIONS: There were no complications. ENDOSCOPIC IMPRESSION: 1.   Two non-bleedingAVMs, cecum and hepatic flexure 2.   Moderate diverticulosis was noted at the cecum, in the ascending colon, descending colon, and sigmoid colon 3.   Sessile polyp measuring 7 mm in the sigmoid colon; polypectomy performed using snare cautery  RECOMMENDATIONS: 1.  Hold aspirin, aspirin products, and anti-inflammatory medication for 2 weeks. Continue Fe supplements. 2.  Await pathology results 3.  High fiber diet with liberal fluid intake. 4.  Given your age, you will not need another colonoscopy for colon cancer screening or polyp surveillance.  These types of tests usually stop around the age 65.  eSigned:  Ladene Artist, MD, [facility] 08/12/2013 1:44 PM

## 2013-08-12 NOTE — Patient Instructions (Addendum)
YOU HAD AN ENDOSCOPIC PROCEDURE TODAY AT THE [facility]: Refer to the procedure report that was given to you for any specific questions about what was found during the examination.  If the procedure report does not answer your questions, please call your gastroenterologist to clarify.  If you requested that your care partner not be given the details of your procedure findings, then the procedure report has been included in a sealed envelope for you to review at your convenience later.  YOU SHOULD EXPECT: Some feelings of bloating in the abdomen. Passage of more gas than usual.  Walking can help get rid of the air that was put into your GI tract during the procedure and reduce the bloating. If you had a lower endoscopy (such as a colonoscopy or flexible sigmoidoscopy) you may notice spotting of blood in your stool or on the toilet paper. If you underwent a bowel prep for your procedure, then you may not have a normal bowel movement for a few days.  DIET: Your first meal following the procedure should be a light meal and then it is ok to progress to your normal diet.  A half-sandwich or bowl of soup is an example of a good first meal.  Heavy or fried foods are harder to digest and may make you feel nauseous or bloated.  Likewise meals heavy in dairy and vegetables can cause extra gas to form and this can also increase the bloating.  Drink plenty of fluids but you should avoid alcoholic beverages for 24 hours.  ACTIVITY: Your care partner should take you home directly after the procedure.  You should plan to take it easy, moving slowly for the rest of the day.  You can resume normal activity the day after the procedure however you should NOT DRIVE or use heavy machinery for 24 hours (because of the sedation medicines used during the test).    SYMPTOMS TO REPORT IMMEDIATELY: A gastroenterologist can be reached at any hour.  During normal business hours, 8:30 AM to 5:00 PM Monday through Friday,  call [phone].  After hours and on weekends, please call the GI answering service at [phone] who will take a message and have the physician on call contact you.   Following lower endoscopy (colonoscopy or flexible sigmoidoscopy):  Excessive amounts of blood in the stool  Significant tenderness or worsening of abdominal pains  Swelling of the abdomen that is new, acute  Fever of 100F or higher  Following upper endoscopy (EGD)  Vomiting of blood or coffee ground material  New chest pain or pain under the shoulder blades  Painful or persistently difficult swallowing  New shortness of breath  Fever of 100F or higher  Black, tarry-looking stools  FOLLOW UP: If any biopsies were taken you will be contacted by phone or by letter within the next 1-3 weeks.  Call your gastroenterologist if you have not heard about the biopsies in 3 weeks.  Our staff will call the home number listed on your records the next business day following your procedure to check on you and address any questions or concerns that you may have at that time regarding the information given to you following your procedure. This is a courtesy call and so if there is no answer at the home number and we have not heard from you through the emergency physician on call, we will assume that you have returned to your regular daily activities without incident.  SIGNATURES/CONFIDENTIALITY: You and/or your care   partner have signed paperwork which will be entered into your electronic medical record.  These signatures attest to the fact that that the information above on your After Visit Summary has been reviewed and is understood.  Full responsibility of the confidentiality of this discharge information lies with you and/or your care-partner.  Try to eat a high fiber diet to reduce the chances of Diverticulitis.  Hold all aspirin, aleve, ibuprofen and anti-inflammatories for two weeks.   Continue your iron every day, and your  reflux medicine.

## 2013-08-13 ENCOUNTER — Telehealth: Payer: Self-pay | Admitting: *Deleted

## 2013-08-13 NOTE — Telephone Encounter (Signed)
  Follow up Call-  Call back number 08/12/2013  Post procedure Call Back phone  # [phone] or [phone](daughters #)  Permission to leave phone message Yes     Patient questions:  Do you have a fever, pain , or abdominal swelling? no Pain Score  0 *  Have you tolerated food without any problems? yes  Have you been able to return to your normal activities? yes  Do you have any questions about your discharge instructions: Diet   no Medications  no Follow up visit  no  Do you have questions or concerns about your Care? no  Actions: * If pain score is 4 or above: No action needed, pain <4.  Answers provided per daughter. Daughter stated she did good.

## 2013-08-18 ENCOUNTER — Emergency Department (HOSPITAL_COMMUNITY): Payer: Medicare Other

## 2013-08-18 ENCOUNTER — Emergency Department (HOSPITAL_COMMUNITY)
Admission: EM | Admit: 2013-08-18 | Discharge: 2013-08-18 | Disposition: A | Discharge status: Home / Self Care | Payer: Medicare Other | Attending: Emergency Medicine | Admitting: Emergency Medicine

## 2013-08-18 ENCOUNTER — Encounter (HOSPITAL_COMMUNITY): Payer: Self-pay | Admitting: Emergency Medicine

## 2013-08-18 DIAGNOSIS — Z7982 Long term (current) use of aspirin: Secondary | ICD-10-CM | POA: Insufficient documentation

## 2013-08-18 DIAGNOSIS — Z5189 Encounter for other specified aftercare: Secondary | ICD-10-CM | POA: Insufficient documentation

## 2013-08-18 DIAGNOSIS — I4891 Unspecified atrial fibrillation: Secondary | ICD-10-CM | POA: Diagnosis not present

## 2013-08-18 DIAGNOSIS — Z8679 Personal history of other diseases of the circulatory system: Secondary | ICD-10-CM | POA: Insufficient documentation

## 2013-08-18 DIAGNOSIS — M7989 Other specified soft tissue disorders: Secondary | ICD-10-CM | POA: Diagnosis not present

## 2013-08-18 DIAGNOSIS — Z888 Allergy status to other drugs, medicaments and biological substances status: Secondary | ICD-10-CM | POA: Diagnosis not present

## 2013-08-18 DIAGNOSIS — I447 Left bundle-branch block, unspecified: Secondary | ICD-10-CM | POA: Insufficient documentation

## 2013-08-18 DIAGNOSIS — M129 Arthropathy, unspecified: Secondary | ICD-10-CM | POA: Insufficient documentation

## 2013-08-18 DIAGNOSIS — H9190 Unspecified hearing loss, unspecified ear: Secondary | ICD-10-CM | POA: Diagnosis not present

## 2013-08-18 DIAGNOSIS — Z87891 Personal history of nicotine dependence: Secondary | ICD-10-CM | POA: Insufficient documentation

## 2013-08-18 DIAGNOSIS — Z79899 Other long term (current) drug therapy: Secondary | ICD-10-CM | POA: Insufficient documentation

## 2013-08-18 DIAGNOSIS — I10 Essential (primary) hypertension: Secondary | ICD-10-CM | POA: Insufficient documentation

## 2013-08-18 DIAGNOSIS — R569 Unspecified convulsions: Secondary | ICD-10-CM | POA: Diagnosis not present

## 2013-08-18 DIAGNOSIS — D509 Iron deficiency anemia, unspecified: Secondary | ICD-10-CM | POA: Insufficient documentation

## 2013-08-18 DIAGNOSIS — Z8719 Personal history of other diseases of the digestive system: Secondary | ICD-10-CM | POA: Diagnosis not present

## 2013-08-18 DIAGNOSIS — R51 Headache: Secondary | ICD-10-CM | POA: Diagnosis not present

## 2013-08-18 DIAGNOSIS — I739 Peripheral vascular disease, unspecified: Secondary | ICD-10-CM | POA: Insufficient documentation

## 2013-08-18 DIAGNOSIS — Z881 Allergy status to other antibiotic agents status: Secondary | ICD-10-CM | POA: Insufficient documentation

## 2013-08-18 LAB — COMPREHENSIVE METABOLIC PANEL
AST: 14 U/L (ref 0–37)
Albumin: 4 g/dL (ref 3.5–5.2)
Alkaline Phosphatase: 90 U/L (ref 39–117)
Chloride: 100 mEq/L (ref 96–112)
Potassium: 3.6 mEq/L (ref 3.5–5.1)
Sodium: 136 mEq/L (ref 135–145)
Total Bilirubin: 0.3 mg/dL (ref 0.3–1.2)
Total Protein: 7.2 g/dL (ref 6.0–8.3)

## 2013-08-18 LAB — CBC
HCT: 34 % — ABNORMAL LOW (ref 36.0–46.0)
MCH: 26.5 pg (ref 26.0–34.0)
MCV: 80.6 fL (ref 78.0–100.0)
Platelets: 230 10*3/uL (ref 150–400)
RDW: 15.7 % — ABNORMAL HIGH (ref 11.5–15.5)

## 2013-08-18 LAB — URINALYSIS, DIPSTICK ONLY
Nitrite: NEGATIVE
Specific Gravity, Urine: 1.003 — ABNORMAL LOW (ref 1.005–1.030)
Urobilinogen, UA: 0.2 mg/dL (ref 0.0–1.0)
pH: 5 (ref 5.0–8.0)

## 2013-08-18 MED ORDER — CLONIDINE HCL 0.1 MG PO TABS
0.1000 mg | ORAL_TABLET | Freq: Once | ORAL | Status: AC
  Administered 2013-08-18: 0.1 mg via ORAL
  Filled 2013-08-18: qty 1

## 2013-08-18 MED ORDER — ACETAMINOPHEN 325 MG PO TABS
650.0000 mg | ORAL_TABLET | Freq: Once | ORAL | Status: AC
  Administered 2013-08-18: 650 mg via ORAL
  Filled 2013-08-18: qty 2

## 2013-08-18 NOTE — ED Notes (Signed)
Pt c/o generalized HA starting this am; pt noted to be hypertensive; pt denies vision change or nausea

## 2013-08-18 NOTE — ED Provider Notes (Signed)
CSN: [id number]     Arrival date & time 08/18/13  0849 History   First MD Initiated Contact with Patient 08/18/13 0914     Chief Complaint  Patient presents with  . Headache  . Hypertension   (Consider location/radiation/quality/duration/timing/severity/associated sxs/prior Treatment) HPI Comments: 79 yo AA female with cc of HA.  BP at home was not checked.  No trauma. No n/v, no neurologic deficit.  Pt has longstanding h/o HTN and is on multiple medications.    Patient is a 79 y.o. female presenting with headaches and hypertension. The history is provided by the patient and a relative.  Headache Pain location:  Frontal Quality:  Dull Radiates to:  Does not radiate Severity currently:  8/10 Severity at highest:  8/10 Onset quality:  Gradual Duration:  6 hours Timing:  Constant Progression:  Improving Chronicity:  New Context: not activity, not exposure to bright light and not caffeine   Relieved by:  None tried Worsened by:  Nothing tried Associated symptoms: hearing loss   Associated symptoms: no abdominal pain, no back pain, no blurred vision, no congestion, no cough, no dizziness, no pain, no fever, no focal weakness, no myalgias, no nausea, no near-syncope, no neck pain, no neck stiffness, no numbness, no paresthesias, no photophobia, no seizures, no sinus pressure, no sore throat and no tingling   Risk factors: no anger, no family hx of SAH, does not have insomnia and lifestyle not sedentary   Hypertension Associated symptoms include headaches. Pertinent negatives include no chest pain, no abdominal pain and no shortness of breath.    Past Medical History  Diagnosis Date  . Hypertension   . Internal hemorrhoids   . Diverticulosis   . Seizure disorder   . Arthritis   . Left bundle branch block   . Palpitation   . PVD (peripheral vascular disease)   . PAF (paroxysmal atrial fibrillation)   . Iron deficiency anemia   . Seizure disorder   . Personal history of subdural  hematoma   . Blood transfusion without reported diagnosis 07/2013   Past Surgical History  Procedure Laterality Date  . Laparoscopic appendectomy    . Knee arthroscopy    . Inguinal hernia repair    . Angioplasty  1999  . Laparoscopic cholecystectomy    . Carpal tunnel release    . Cataract extraction     History reviewed. No pertinent family history. History  Substance Use Topics  . Smoking status: Former Research scientist (life sciences)  . Smokeless tobacco: Never Used  . Alcohol Use: No   OB History   Grav Para Term Preterm Abortions TAB SAB Ect Mult Living                 Review of Systems  Constitutional: Negative.  Negative for fever.  HENT: Positive for hearing loss. Negative for congestion, sore throat, neck pain, neck stiffness and sinus pressure.   Eyes: Negative.  Negative for blurred vision, photophobia, pain, discharge, redness and itching.  Respiratory: Negative.  Negative for apnea, cough, choking, chest tightness and shortness of breath.   Cardiovascular: Negative.  Negative for chest pain, palpitations, leg swelling and near-syncope.  Gastrointestinal: Negative.  Negative for nausea and abdominal pain.  Endocrine: Negative.   Musculoskeletal: Negative for myalgias and back pain.  Neurological: Positive for headaches. Negative for dizziness, focal weakness, seizures, syncope, facial asymmetry, speech difficulty, light-headedness, numbness and paresthesias.  Hematological: Negative.     Allergies  Cephalexin; Haldol; and Lorazepam  Home Medications   Current  Outpatient Rx  Name  Route  Sig  Dispense  Refill  . benazepril (LOTENSIN) 20 MG tablet   Oral   Take 20 mg by mouth daily.         . cloNIDine (CATAPRES) 0.1 MG tablet   Oral   Take 0.1 mg by mouth 2 (two) times daily.         . furosemide (LASIX) 20 MG tablet   Oral   Take 40 mg by mouth every morning.          . hydrALAZINE (APRESOLINE) 25 MG tablet   Oral   Take 25 mg by mouth 2 (two) times daily.          . iron polysaccharides (NIFEREX) 150 MG capsule   Oral   Take 1 capsule (150 mg total) by mouth 2 (two) times daily.   60 capsule   2   . levETIRAcetam (KEPPRA) 500 MG tablet   Oral   Take 500 mg by mouth 2 (two) times daily.         . nitrofurantoin (MACRODANTIN) 100 MG capsule   Oral   Take 100 mg by mouth at bedtime.         . potassium chloride (K-DUR,KLOR-CON) 10 MEQ tablet   Oral   Take 10 mEq by mouth every other day. Take with food.         . Probiotic Product (PROBIOTIC FORMULA PO)   Oral   Take 1 tablet by mouth daily.         . simvastatin (ZOCOR) 20 MG tablet   Oral   Take 20 mg by mouth every evening.         Marland Kitchen aspirin EC 81 MG tablet   Oral   Take 81 mg by mouth daily.          BP 171/75  Pulse 57  Temp(Src) 98.6 F (37 C) (Oral)  Resp 18  SpO2 100% Physical Exam  Constitutional: She is oriented to person, place, and time. She appears well-developed and well-nourished.  HENT:  Head: Normocephalic and atraumatic.  Right Ear: External ear normal.  Left Ear: External ear normal.  Nose: Nose normal.  Mouth/Throat: Oropharynx is clear and moist.  Eyes: Conjunctivae and EOM are normal. Pupils are equal, round, and reactive to light.  Neck: Normal range of motion. Neck supple. No JVD present. No tracheal deviation present.  Cardiovascular: Normal rate and regular rhythm.   Pulmonary/Chest: Effort normal and breath sounds normal.  Abdominal: Soft. Bowel sounds are normal. She exhibits no distension and no mass. There is no tenderness. There is no rebound and no guarding.  Musculoskeletal: Normal range of motion. She exhibits edema. She exhibits no tenderness.  Neurological: She is alert and oriented to person, place, and time. She has normal strength and normal reflexes. She displays no tremor and normal reflexes. No cranial nerve deficit. She exhibits normal muscle tone. She displays no seizure activity. Coordination and gait normal.  Normal  finger to nose       ED Course  Procedures (including critical care time) Labs Review Labs Reviewed  URINALYSIS, DIPSTICK ONLY - Abnormal; Notable for the following:    Specific Gravity, Urine 1.003 (*)    Leukocytes, UA TRACE (*)    All other components within normal limits  COMPREHENSIVE METABOLIC PANEL - Abnormal; Notable for the following:    Glucose, Bld 102 (*)    Creatinine, Ser 1.43 (*)    GFR calc non Af Wyvonnia Lora  33 (*)    GFR calc Af Amer 39 (*)    All other components within normal limits  CBC - Abnormal; Notable for the following:    Hemoglobin 11.2 (*)    HCT 34.0 (*)    RDW 15.7 (*)    All other components within normal limits   Imaging Review Ct Head Wo Contrast  08/18/2013   *RADIOLOGY REPORT*  Clinical Data: Severe headache.  Hypertension.  CT HEAD WITHOUT CONTRAST  Technique:  Contiguous axial images were obtained from the base of the skull through the vertex without contrast.  Comparison: 12/16/2010  Findings: There is no acute intracranial hemorrhage, infarction, or mass lesion.  There is mild atrophy and there are scattered areas of periventricular white matter lucency consistent with chronic small vessel ischemic disease, unchanged.  No osseous abnormality.  IMPRESSION: No acute abnormalities.   Original Report Authenticated By: Lorriane Shire, M.D.     Date: 08/18/2013  Rate: 56  Rhythm: normal sinus rhythm  QRS Axis: left  Intervals: normal  ST/T Wave abnormalities: LBBB  Conduction Disutrbances:left bundle branch block  Narrative Interpretation:   Old EKG Reviewed: none available and unchanged Results for orders placed during the hospital encounter of 08/18/13  URINALYSIS, DIPSTICK ONLY      Result Value Range   Specific Gravity, Urine 1.003 (*) 1.005 - 1.030   pH 5.0  5.0 - 8.0   Glucose, UA NEGATIVE  NEGATIVE mg/dL   Hgb urine dipstick NEGATIVE  NEGATIVE   Bilirubin Urine NEGATIVE  NEGATIVE   Ketones, ur NEGATIVE  NEGATIVE mg/dL   Protein, ur  NEGATIVE  NEGATIVE mg/dL   Urobilinogen, UA 0.2  0.0 - 1.0 mg/dL   Nitrite NEGATIVE  NEGATIVE   Leukocytes, UA TRACE (*) NEGATIVE  COMPREHENSIVE METABOLIC PANEL      Result Value Range   Sodium 136  135 - 145 mEq/L   Potassium 3.6  3.5 - 5.1 mEq/L   Chloride 100  96 - 112 mEq/L   CO2 23  19 - 32 mEq/L   Glucose, Bld 102 (*) 70 - 99 mg/dL   BUN 17  6 - 23 mg/dL   Creatinine, Ser 1.43 (*) 0.50 - 1.10 mg/dL   Calcium 9.6  8.4 - 10.5 mg/dL   Total Protein 7.2  6.0 - 8.3 g/dL   Albumin 4.0  3.5 - 5.2 g/dL   AST 14  0 - 37 U/L   ALT 8  0 - 35 U/L   Alkaline Phosphatase 90  39 - 117 U/L   Total Bilirubin 0.3  0.3 - 1.2 mg/dL   GFR calc non Af Amer 33 (*) >90 mL/min   GFR calc Af Amer 39 (*) >90 mL/min  CBC      Result Value Range   WBC 7.8  4.0 - 10.5 K/uL   RBC 4.22  3.87 - 5.11 MIL/uL   Hemoglobin 11.2 (*) 12.0 - 15.0 g/dL   HCT 34.0 (*) 36.0 - 46.0 %   MCV 80.6  78.0 - 100.0 fL   MCH 26.5  26.0 - 34.0 pg   MCHC 32.9  30.0 - 36.0 g/dL   RDW 15.7 (*) 11.5 - 15.5 %   Platelets 230  150 - 400 K/uL     MDM   1. Hypertension   2. Headache    79 yo AA female with longstanding h/o HTN presents to ER with cc of HA.  Pt hasn't taken any Rx for the HA.  Initial  BP was elevated.  Pt was given clonidine and the BP responded.  Her HA improved with tylenol.  ER workup was negative for evidence of HTN emergency.  CT head, UA, CMP, EKG were negative.  Pt had no cp, sob, neuro deficits, abd pain, extremity pain.    Plan to d/c home with PCM follow up for optimization of antihypertensive medications.  The patient appears reasonably screened and/or stabilized for discharge and I doubt any other medical condition or other [facility] requiring further screening, evaluation, or treatment in the ED at this time prior to discharge. Pt and her daughter agree with plan.      Elmer Sow, MD 08/19/13 [phone]

## 2013-08-19 ENCOUNTER — Encounter: Payer: Self-pay | Admitting: Gastroenterology

## 2013-08-25 DIAGNOSIS — D649 Anemia, unspecified: Secondary | ICD-10-CM | POA: Diagnosis not present

## 2013-08-25 DIAGNOSIS — E568 Deficiency of other vitamins: Secondary | ICD-10-CM | POA: Diagnosis not present

## 2013-08-25 DIAGNOSIS — G44209 Tension-type headache, unspecified, not intractable: Secondary | ICD-10-CM | POA: Diagnosis not present

## 2013-08-25 DIAGNOSIS — I10 Essential (primary) hypertension: Secondary | ICD-10-CM | POA: Diagnosis not present

## 2013-10-13 ENCOUNTER — Other Ambulatory Visit: Payer: Self-pay

## 2013-10-13 DIAGNOSIS — Z1231 Encounter for screening mammogram for malignant neoplasm of breast: Secondary | ICD-10-CM

## 2013-10-14 DIAGNOSIS — Z23 Encounter for immunization: Secondary | ICD-10-CM | POA: Diagnosis not present

## 2013-10-15 ENCOUNTER — Ambulatory Visit
Admission: RE | Admit: 2013-10-15 | Discharge: 2013-10-15 | Disposition: A | Discharge status: Home / Self Care | Payer: Medicare Other | Source: Ambulatory Visit

## 2013-10-15 DIAGNOSIS — Z1231 Encounter for screening mammogram for malignant neoplasm of breast: Secondary | ICD-10-CM | POA: Diagnosis not present

## 2013-11-24 DIAGNOSIS — I10 Essential (primary) hypertension: Secondary | ICD-10-CM | POA: Diagnosis not present

## 2013-11-24 DIAGNOSIS — M129 Arthropathy, unspecified: Secondary | ICD-10-CM | POA: Diagnosis not present

## 2013-11-24 DIAGNOSIS — R569 Unspecified convulsions: Secondary | ICD-10-CM | POA: Diagnosis not present

## 2013-11-24 DIAGNOSIS — D649 Anemia, unspecified: Secondary | ICD-10-CM | POA: Diagnosis not present

## 2013-12-11 HISTORY — PX: BREAST BIOPSY: SHX20

## 2013-12-30 ENCOUNTER — Ambulatory Visit: Payer: Medicare Other | Admitting: Cardiovascular Disease

## 2014-01-21 DIAGNOSIS — I10 Essential (primary) hypertension: Secondary | ICD-10-CM | POA: Diagnosis not present

## 2014-01-23 DIAGNOSIS — D649 Anemia, unspecified: Secondary | ICD-10-CM | POA: Diagnosis not present

## 2014-01-23 DIAGNOSIS — I10 Essential (primary) hypertension: Secondary | ICD-10-CM | POA: Diagnosis not present

## 2014-01-23 DIAGNOSIS — R5381 Other malaise: Secondary | ICD-10-CM | POA: Diagnosis not present

## 2014-01-27 ENCOUNTER — Ambulatory Visit: Payer: Medicare Other | Admitting: Cardiovascular Disease

## 2014-02-02 DIAGNOSIS — H6120 Impacted cerumen, unspecified ear: Secondary | ICD-10-CM | POA: Diagnosis not present

## 2014-02-02 DIAGNOSIS — H902 Conductive hearing loss, unspecified: Secondary | ICD-10-CM | POA: Diagnosis not present

## 2014-02-06 ENCOUNTER — Ambulatory Visit: Payer: Medicare Other | Admitting: Cardiovascular Disease

## 2014-02-10 ENCOUNTER — Encounter: Payer: Self-pay | Admitting: Cardiovascular Disease

## 2014-02-24 DIAGNOSIS — G40909 Epilepsy, unspecified, not intractable, without status epilepticus: Secondary | ICD-10-CM | POA: Diagnosis not present

## 2014-02-24 DIAGNOSIS — I6789 Other cerebrovascular disease: Secondary | ICD-10-CM | POA: Diagnosis not present

## 2014-02-24 DIAGNOSIS — I10 Essential (primary) hypertension: Secondary | ICD-10-CM | POA: Diagnosis not present

## 2014-02-24 DIAGNOSIS — E782 Mixed hyperlipidemia: Secondary | ICD-10-CM | POA: Diagnosis not present

## 2014-02-24 DIAGNOSIS — E7800 Pure hypercholesterolemia, unspecified: Secondary | ICD-10-CM | POA: Diagnosis not present

## 2014-02-24 DIAGNOSIS — E119 Type 2 diabetes mellitus without complications: Secondary | ICD-10-CM | POA: Diagnosis not present

## 2014-03-12 DIAGNOSIS — N289 Disorder of kidney and ureter, unspecified: Secondary | ICD-10-CM | POA: Diagnosis not present

## 2014-03-12 DIAGNOSIS — M069 Rheumatoid arthritis, unspecified: Secondary | ICD-10-CM | POA: Diagnosis not present

## 2014-03-12 DIAGNOSIS — I10 Essential (primary) hypertension: Secondary | ICD-10-CM | POA: Diagnosis not present

## 2014-03-16 ENCOUNTER — Encounter: Payer: Self-pay | Admitting: Cardiovascular Disease

## 2014-03-16 ENCOUNTER — Ambulatory Visit (INDEPENDENT_AMBULATORY_CARE_PROVIDER_SITE_OTHER): Payer: Medicare Other | Admitting: Cardiovascular Disease

## 2014-03-16 VITALS — BP 125/90 | HR 68 | Ht 59.5 in | Wt 182.0 lb

## 2014-03-16 DIAGNOSIS — I10 Essential (primary) hypertension: Secondary | ICD-10-CM | POA: Diagnosis not present

## 2014-03-16 DIAGNOSIS — E785 Hyperlipidemia, unspecified: Secondary | ICD-10-CM | POA: Diagnosis not present

## 2014-03-16 DIAGNOSIS — I5189 Other ill-defined heart diseases: Secondary | ICD-10-CM

## 2014-03-16 DIAGNOSIS — I519 Heart disease, unspecified: Secondary | ICD-10-CM | POA: Diagnosis not present

## 2014-03-16 NOTE — Patient Instructions (Signed)
Your physician recommends that you return for a FASTING lipid profile: 6 months   Your physician wants you to follow-up in:6 months  You will receive a reminder letter in the mail two months in advance. If you don't receive a letter, please call our office to schedule the follow-up appointment.  Your physician recommends that you continue on your current medications as directed. Please refer to the Current Medication list given to you today.

## 2014-03-16 NOTE — Assessment & Plan Note (Signed)
Will check lipids, liver, and BMP at next visit.

## 2014-03-16 NOTE — Progress Notes (Signed)
[name] End Date of Birth: 18-Oct-1930 Medical Record [id number]   Problem List 1. LBBB 2.  Diastolic dysfunction 3. Anemia - received 2 units of PRBC last 4. HTN 5, PVD - renal artery stenosis   History of Present Illness: [name] is seen back today for a 10 day check.    She has a chronic LBBB, remote stress testing in 2005 which was normal, HTN, PVD with past renal artery stenosis, PAF (but with no real documentation of this apparently), iron deficiency anemia, seizure disorder and prior subdural hematoma and a past GI bleed. She has had chronic chest pain and palpitations for many years. Echo back in 2010 showed normal LV function, mild LVH with mild hypertrophy and moderate obstruction of the outflow track.   I saw her earlier this month. BP was quite high. We have adjusted her medicines and updated her echo. She has severe LVH and is felt to have diastolic dysfunction. Hydralazine was increased. She had not taken any of her medicines prior to her last visit with me.   She comes back today. She is here alone. Doing ok. BP still high here today. She brought her cuff in to check. Her readings in the memory are reviewed and for the most part look good. She feels ok and actually says she feels better. Riding her bike at home.   June 23, 2013:  [name] was last seen by me in 2011.  She was seen by [name] in March 2014.    She is breathing OK.  She continues to have problems with anemia and is breathing  better since she had her transfusion.  No CP .  She has white coat HTN.  She does not add salt.      She was accompanied by family.    March 16, 2014:  [name] presents for follow up .  BP was elevated here but it was high here.  BP readings are ok at home.      Current Outpatient Prescriptions on File Prior to Visit  Medication Sig Dispense Refill  . benazepril (LOTENSIN) 20 MG tablet Take 20 mg by mouth daily.      . cloNIDine (CATAPRES) 0.1 MG tablet Take 0.1 mg by mouth 2 (two)  times daily.      . furosemide (LASIX) 20 MG tablet Take 40 mg by mouth every morning.       . hydrALAZINE (APRESOLINE) 25 MG tablet Take 25 mg by mouth 2 (two) times daily.      . iron polysaccharides (NIFEREX) 150 MG capsule Take 1 capsule (150 mg total) by mouth 2 (two) times daily.  60 capsule  2  . levETIRAcetam (KEPPRA) 500 MG tablet Take 500 mg by mouth 2 (two) times daily.      . nitrofurantoin (MACRODANTIN) 100 MG capsule Take 100 mg by mouth at bedtime.      . Probiotic Product (PROBIOTIC FORMULA PO) Take 1 tablet by mouth daily.      . simvastatin (ZOCOR) 20 MG tablet Take 20 mg by mouth every evening.      Marland Kitchen aspirin EC 81 MG tablet Take 81 mg by mouth daily.       No current facility-administered medications on file prior to visit.    Allergies  Allergen Reactions  . Cephalexin Other (See Comments)    REACTION: Questionable, no reaction listed  . Haldol [Haloperidol Decanoate] Other (See Comments)    unknown  . Lorazepam Other (See Comments)    unknown  Past Medical History  Diagnosis Date  . Hypertension   . Internal hemorrhoids   . Diverticulosis   . Seizure disorder   . Arthritis   . Left bundle branch block   . Palpitation   . PVD (peripheral vascular disease)   . PAF (paroxysmal atrial fibrillation)   . Iron deficiency anemia   . Seizure disorder   . Personal history of subdural hematoma   . Blood transfusion without reported diagnosis 07/2013    Past Surgical History  Procedure Laterality Date  . Laparoscopic appendectomy    . Knee arthroscopy    . Inguinal hernia repair    . Angioplasty  1999  . Laparoscopic cholecystectomy    . Carpal tunnel release    . Cataract extraction      History  Smoking status  . Former Smoker  Smokeless tobacco  . Never Used    History  Alcohol Use No    No family history on file.  Review of Systems: The review of systems is per the HPI.  All other systems were reviewed and are negative.  Physical  Exam: BP 180/90  Pulse 68  Ht 4' 11.5" (1.511 m)  Wt 182 lb (82.555 kg)  BMI 36.16 kg/m2 Patient is very pleasant and in no acute distress. Weight is down 2 pounds. She remains obese. Her BP cuff correlates nicely with ours today.  Skin is warm and dry. Color is normal.  HEENT is unremarkable. Normocephalic/atraumatic. PERRL. Sclera are nonicteric. Neck is supple. No masses. No JVD. Lungs are clear. Cardiac exam shows a regular rate and rhythm. Abdomen is soft. Extremities are without edema. Gait and ROM are intact. No gross neurologic deficits noted.  LABORATORY DATA:   Chemistry      Component Value Date/Time   NA 136 08/18/2013 1020   K 3.6 08/18/2013 1020   CL 100 08/18/2013 1020   CO2 23 08/18/2013 1020   BUN 17 08/18/2013 1020   CREATININE 1.43* 08/18/2013 1020      Component Value Date/Time   CALCIUM 9.6 08/18/2013 1020   ALKPHOS 90 08/18/2013 1020   AST 14 08/18/2013 1020   ALT 8 08/18/2013 1020   BILITOT 0.3 08/18/2013 1020     Lab Results  Component Value Date   WBC 7.8 08/18/2013   HGB 11.2* 08/18/2013   HCT 34.0* 08/18/2013   MCV 80.6 08/18/2013   PLT 230 08/18/2013   Echo Study Conclusions February 2014  - Left ventricle: Abnormal septal motion. Small LV cavity Wall thickness was increased in a pattern of severe LVH. The estimated ejection fraction was 55%. - Aortic valve: Not well seen Mild AS Calcified Cannot tell if trileaflet or not Mean gradient: 32mm Hg (S). Peak gradient: 39mm Hg (S). - Pulmonary arteries: PA peak pressure: 32mm Hg (S).  ECG:  NSR at 68.  LBBB, no changes.  Assessment / Plan:

## 2014-03-16 NOTE — Assessment & Plan Note (Signed)
[name] is doing well.  She tries to watch her salt.  Will continue her current meds.  I will see her again in 6 months with fasting labs.

## 2014-04-01 ENCOUNTER — Encounter: Payer: Self-pay | Admitting: Podiatry

## 2014-04-01 ENCOUNTER — Ambulatory Visit (INDEPENDENT_AMBULATORY_CARE_PROVIDER_SITE_OTHER): Payer: Medicare Other | Admitting: Podiatry

## 2014-04-01 VITALS — BP 162/77 | HR 86 | Resp 12

## 2014-04-01 DIAGNOSIS — M2010 Hallux valgus (acquired), unspecified foot: Secondary | ICD-10-CM | POA: Diagnosis not present

## 2014-04-01 DIAGNOSIS — M722 Plantar fascial fibromatosis: Secondary | ICD-10-CM

## 2014-04-01 DIAGNOSIS — M2040 Other hammer toe(s) (acquired), unspecified foot: Secondary | ICD-10-CM | POA: Diagnosis not present

## 2014-04-01 NOTE — Patient Instructions (Addendum)
Hammer Toes Hammer toes is a condition in which one or more of your toes is permanently flexed. CAUSES  This happens when a muscle imbalance or abnormal bone length makes your small toes buckle. This causes the toe joint to contract and the strong cord-like bands that attach muscles to the bones (tendons) in your toes to shorten.  SIGNS AND SYMPTOMS  Common symptoms of flexible hammer toes include:   A build up of skin cells (Corns). Corns occur where boney bumps come in frequent contact with hard surfaces. For example, where your shoes press and rub.  Irritation.  Inflammation.  Pain.  Limited motion in your toes DIAGNOSIS  Hammer toes are diagnosed through a physical exam of your toes.During the exam, your health care provider may try to reproduce your symptoms by manipulating your foot. Often, x-ray exams are done to determine the degree of deformity and to make sure that the cause is not a fracture.  TREATMENT  Hammer toes can be treated with corrective surgery. There are several types of surgical procedures that can treat hammer toes. The most common procedures include:  Arthroplasty A portion of the joint is surgically removed and your toe is straightened. The gap fills in with fibrous tissue. This procedure helps treat pain and deformity and helps restore function.  Fusion Cartilage between the two bones of the affected joint is taken out and the bones fuse together into one longer bone. This helps keep your toe stable and reduces pain but leaves your toe stiff, yet straight.  Implantation A portion of your bone is removed and replaced with an implant to restore motion.  Flexor tendon transfers This procedure repositions the tendons that curl the toes down (flexor tendons). This may be done to release the deforming force that causes your toe to buckle. Several of these procedures require fixing your toe with a pin that is visible at the tip of your toe. The pin keeps the toe  straight during healing. Your health care provider will remove the pin usually within 4 8 weeks after the procedure.  Document Released: 11/24/2000 Document Revised: 09/17/2013 Document Reviewed: 08/04/2013 [employer] Patient Information 2014 [employer].  

## 2014-04-01 NOTE — Progress Notes (Signed)
   Subjective:    Patient ID: [name], female    DOB: 1930/07/22, 80 y.o.   MRN: [id number]  HPI PT STATED RT FOOT 2ND TOE IS NOT HURTING BUT GET AGGRAVATED BY WEARING SHOES FOR  6 YEARS. THE GETTING WORSE AND IS CURVING IN AND TRIED NO TREATMENT.    Review of Systems  Respiratory: Positive for shortness of breath.   Genitourinary: Positive for frequency.  All other systems reviewed and are negative.      Objective:   Physical Exam  Orientated x58  80 year old black female  Vascular: PTs are 1/4 bilaterally. DP right 1/4 right and DP left 2/4  Neurological: Sensation to 10 g  monofilament wire intact 5/5 bilaterally. Vibratory sensation intact bilaterally. Ankle reflexes reactive bilaterally.  Dermatological: Texture and turgor within normal limits. Well-healed surgical scar noted on the right first MPJ  Musculoskeletal: Patient is unsteady gait pattern walking with cane. HAV deformity noted left. Rigid hammertoe second right toe noted.        Assessment & Plan:   Assessment: Decrease pedal pulses suggestive of peripheral arterial disease bilaterally. 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 Two silicone toe wedges were dispensed to insert between the hallux and second toes bilaterally, which are the symptomatic areas.  I am not recommending surgical treatment because of undetermined vascular status and level of symptoms.  Information provided to patient about hammertoe deformities.

## 2014-04-02 ENCOUNTER — Encounter: Payer: Self-pay | Admitting: Podiatry

## 2014-04-13 DIAGNOSIS — E7800 Pure hypercholesterolemia, unspecified: Secondary | ICD-10-CM | POA: Diagnosis not present

## 2014-04-13 DIAGNOSIS — I10 Essential (primary) hypertension: Secondary | ICD-10-CM | POA: Diagnosis not present

## 2014-07-13 DIAGNOSIS — I10 Essential (primary) hypertension: Secondary | ICD-10-CM | POA: Diagnosis not present

## 2014-07-13 DIAGNOSIS — E7800 Pure hypercholesterolemia, unspecified: Secondary | ICD-10-CM | POA: Diagnosis not present

## 2014-08-03 DIAGNOSIS — E7800 Pure hypercholesterolemia, unspecified: Secondary | ICD-10-CM | POA: Diagnosis not present

## 2014-08-03 DIAGNOSIS — I10 Essential (primary) hypertension: Secondary | ICD-10-CM | POA: Diagnosis not present

## 2014-09-02 DIAGNOSIS — Z23 Encounter for immunization: Secondary | ICD-10-CM | POA: Diagnosis not present

## 2014-09-02 DIAGNOSIS — E781 Pure hyperglyceridemia: Secondary | ICD-10-CM | POA: Diagnosis not present

## 2014-09-02 DIAGNOSIS — I10 Essential (primary) hypertension: Secondary | ICD-10-CM | POA: Diagnosis not present

## 2014-09-11 ENCOUNTER — Other Ambulatory Visit (INDEPENDENT_AMBULATORY_CARE_PROVIDER_SITE_OTHER): Payer: Medicare Other | Admitting: *Deleted

## 2014-09-11 DIAGNOSIS — E785 Hyperlipidemia, unspecified: Secondary | ICD-10-CM

## 2014-09-11 DIAGNOSIS — I5189 Other ill-defined heart diseases: Secondary | ICD-10-CM

## 2014-09-11 DIAGNOSIS — I519 Heart disease, unspecified: Secondary | ICD-10-CM

## 2014-09-11 DIAGNOSIS — I10 Essential (primary) hypertension: Secondary | ICD-10-CM | POA: Diagnosis not present

## 2014-09-11 LAB — BASIC METABOLIC PANEL
BUN: 16 mg/dL (ref 6–23)
CHLORIDE: 105 meq/L (ref 96–112)
CO2: 22 mEq/L (ref 19–32)
Calcium: 9.6 mg/dL (ref 8.4–10.5)
Creatinine, Ser: 1.5 mg/dL — ABNORMAL HIGH (ref 0.4–1.2)
GFR: 43.39 mL/min — AB (ref >60.00)
GLUCOSE: 92 mg/dL (ref 70–99)
POTASSIUM: 3.8 meq/L (ref 3.5–5.1)
Sodium: 137 mEq/L (ref 135–145)

## 2014-09-11 LAB — HEPATIC FUNCTION PANEL
ALT: 12 U/L (ref 0–35)
AST: 18 U/L (ref 0–37)
Albumin: 4.2 g/dL (ref 3.5–5.2)
Alkaline Phosphatase: 95 U/L (ref 39–117)
BILIRUBIN DIRECT: 0 mg/dL (ref 0.0–0.3)
BILIRUBIN TOTAL: 0.5 mg/dL (ref 0.2–1.2)
Total Protein: 7.7 g/dL (ref 6.0–8.3)

## 2014-09-11 LAB — LIPID PANEL
CHOL/HDL RATIO: 5
Cholesterol: 228 mg/dL — ABNORMAL HIGH (ref 0–200)
HDL: 41.5 mg/dL (ref >39.00)
NonHDL: 186.5
Triglycerides: 211 mg/dL — ABNORMAL HIGH (ref 0.0–149.0)
VLDL: 42.2 mg/dL — AB (ref 0.0–40.0)

## 2014-09-11 LAB — LDL CHOLESTEROL, DIRECT: Direct LDL: 123.2 mg/dL

## 2014-09-14 ENCOUNTER — Other Ambulatory Visit: Payer: Self-pay

## 2014-09-14 DIAGNOSIS — Z1231 Encounter for screening mammogram for malignant neoplasm of breast: Secondary | ICD-10-CM

## 2014-09-15 ENCOUNTER — Encounter: Payer: Self-pay | Admitting: Cardiovascular Disease

## 2014-09-15 ENCOUNTER — Ambulatory Visit (INDEPENDENT_AMBULATORY_CARE_PROVIDER_SITE_OTHER): Payer: Medicare Other | Admitting: Cardiovascular Disease

## 2014-09-15 VITALS — BP 135/98 | HR 61 | Ht 59.5 in | Wt 183.0 lb

## 2014-09-15 DIAGNOSIS — E785 Hyperlipidemia, unspecified: Secondary | ICD-10-CM

## 2014-09-15 DIAGNOSIS — I10 Essential (primary) hypertension: Secondary | ICD-10-CM | POA: Diagnosis not present

## 2014-09-15 MED ORDER — OLMESARTAN-AMLODIPINE-HCTZ 40-5-25 MG PO TABS: 1.0000 | ORAL_TABLET | Freq: Every day | ORAL | Status: DC

## 2014-09-15 NOTE — Assessment & Plan Note (Signed)
Followed by Dr. Criss Rosales

## 2014-09-15 NOTE — Patient Instructions (Addendum)
Your physician has recommended you make the following change in your medication:  STOP Benazepril START Tribenzor to 40/5/25 mg once daily  Your physician wants you to follow-up in: 6 months with [name].  You will receive a reminder letter in the mail two months in advance. If you don't receive a letter, please call our office to schedule the follow-up appointment.  Your physician recommends that you return for lab work (basic metabolic panel)  in: 6 months on the same day you see [name] -

## 2014-09-15 NOTE — Assessment & Plan Note (Signed)
Blood pressure remains a little elevated. In reviewing her medications, it appears that she is taking an ACE inhibitor as well as an ARB (Olmasartan in the [employer]) . We'll discontinue the ACE inhibitor ( benazapril) .  Will increase the HCTZ component of the tribenzor to 40-5-25.  She will have her labs checked by Dr. Criss Rosales in about 1 month I will see her in 6 months.

## 2014-09-15 NOTE — Progress Notes (Signed)
[name] End Date of Birth: 1930-04-20 Medical Record [id number]   Problem List 1. LBBB 2.  Diastolic dysfunction 3. Anemia - received 2 units of PRBC last 4. HTN 5, PVD - renal artery stenosis   History of Present Illness: [name] is seen back today for a 10 day check.    She has a chronic LBBB, remote stress testing in 2005 which was normal, HTN, PVD with past renal artery stenosis, PAF (but with no real documentation of this apparently), iron deficiency anemia, seizure disorder and prior subdural hematoma and a past GI bleed. She has had chronic chest pain and palpitations for many years. Echo back in 2010 showed normal LV function, mild LVH with mild hypertrophy and moderate obstruction of the outflow track.   I saw her earlier this month. BP was quite high. We have adjusted her medicines and updated her echo. She has severe LVH and is felt to have diastolic dysfunction. Hydralazine was increased. She had not taken any of her medicines prior to her last visit with me.   She comes back today. She is here alone. Doing ok. BP still high here today. She brought her cuff in to check. Her readings in the memory are reviewed and for the most part look good. She feels ok and actually says she feels better. Riding her bike at home.   June 23, 2013:  [name] was last seen by me in 2011.  She was seen by [name] in March 2014.    She is breathing OK.  She continues to have problems with anemia and is breathing  better since she had her transfusion.  No CP .  She has white coat HTN.  She does not add salt.      She was accompanied by family.    March 16, 2014:  [name] presents for follow up .  BP was elevated here but it was high here.  BP readings are ok at home.   Oct. 6. 2015:      Current Outpatient Prescriptions on File Prior to Visit  Medication Sig Dispense Refill  . aspirin EC 81 MG tablet Take 81 mg by mouth daily.      . benazepril (LOTENSIN) 20 MG tablet Take 20 mg by mouth  daily.      . cloNIDine (CATAPRES) 0.1 MG tablet Take 0.1 mg by mouth 2 (two) times daily.      Marland Kitchen levETIRAcetam (KEPPRA) 500 MG tablet Take 500 mg by mouth 2 (two) times daily.       No current facility-administered medications on file prior to visit.    Allergies  Allergen Reactions  . Cephalexin Other (See Comments)    REACTION: Questionable, no reaction listed  . Haldol [Haloperidol Decanoate] Other (See Comments)    unknown  . Lorazepam Other (See Comments)    unknown    Past Medical History  Diagnosis Date  . Hypertension   . Internal hemorrhoids   . Diverticulosis   . Seizure disorder   . Arthritis   . Left bundle branch block   . Palpitation   . PVD (peripheral vascular disease)   . PAF (paroxysmal atrial fibrillation)   . Iron deficiency anemia   . Seizure disorder   . Personal history of subdural hematoma   . Blood transfusion without reported diagnosis 07/2013    Past Surgical History  Procedure Laterality Date  . Laparoscopic appendectomy    . Knee arthroscopy    . Inguinal hernia repair    .  Angioplasty  1999  . Laparoscopic cholecystectomy    . Carpal tunnel release    . Cataract extraction      History  Smoking status  . Former Smoker  Smokeless tobacco  . Never Used    History  Alcohol Use No    No family history on file.  Review of Systems: The review of systems is per the HPI.  All other systems were reviewed and are negative.  Physical Exam: BP 170/100  Pulse 61  Ht 4' 11.5" (1.511 m)  Wt 183 lb (83.008 kg)  BMI 36.36 kg/m2 Patient is very pleasant and in no acute distress. Weight is down 2 pounds. She remains obese. Her BP cuff correlates nicely with ours today.  Skin is warm and dry. Color is normal.  HEENT is unremarkable. Normocephalic/atraumatic. PERRL. Sclera are nonicteric. Neck is supple. No masses. No JVD. Lungs are clear. Cardiac exam shows a regular rate and rhythm. Abdomen is soft. Extremities are without edema. Gait and  ROM are intact. No gross neurologic deficits noted.  LABORATORY DATA:   Chemistry      Component Value Date/Time   NA 137 09/11/2014 0850   K 3.8 09/11/2014 0850   CL 105 09/11/2014 0850   CO2 22 09/11/2014 0850   BUN 16 09/11/2014 0850   CREATININE 1.5* 09/11/2014 0850      Component Value Date/Time   CALCIUM 9.6 09/11/2014 0850   ALKPHOS 95 09/11/2014 0850   AST 18 09/11/2014 0850   ALT 12 09/11/2014 0850   BILITOT 0.5 09/11/2014 0850     Lab Results  Component Value Date   WBC 7.8 08/18/2013   HGB 11.2* 08/18/2013   HCT 34.0* 08/18/2013   MCV 80.6 08/18/2013   PLT 230 08/18/2013   Echo Study Conclusions February 2014  - Left ventricle: Abnormal septal motion. Small LV cavity Wall thickness was increased in a pattern of severe LVH. The estimated ejection fraction was 55%. - Aortic valve: Not well seen Mild AS Calcified Cannot tell if trileaflet or not Mean gradient: 92mm Hg (S). Peak gradient: 64mm Hg (S). - Pulmonary arteries: PA peak pressure: 65mm Hg (S).  ECG:  NSR at 68.  LBBB, no changes.  Assessment / Plan:

## 2014-09-30 DIAGNOSIS — E780 Pure hypercholesterolemia: Secondary | ICD-10-CM | POA: Diagnosis not present

## 2014-09-30 DIAGNOSIS — K591 Functional diarrhea: Secondary | ICD-10-CM | POA: Diagnosis not present

## 2014-09-30 DIAGNOSIS — N289 Disorder of kidney and ureter, unspecified: Secondary | ICD-10-CM | POA: Diagnosis not present

## 2014-09-30 DIAGNOSIS — I10 Essential (primary) hypertension: Secondary | ICD-10-CM | POA: Diagnosis not present

## 2014-10-13 DIAGNOSIS — I10 Essential (primary) hypertension: Secondary | ICD-10-CM | POA: Diagnosis not present

## 2014-10-13 DIAGNOSIS — G40909 Epilepsy, unspecified, not intractable, without status epilepticus: Secondary | ICD-10-CM | POA: Diagnosis not present

## 2014-10-13 DIAGNOSIS — K591 Functional diarrhea: Secondary | ICD-10-CM | POA: Diagnosis not present

## 2014-10-13 DIAGNOSIS — E780 Pure hypercholesterolemia: Secondary | ICD-10-CM | POA: Diagnosis not present

## 2014-10-19 ENCOUNTER — Ambulatory Visit: Payer: Medicare Other

## 2014-10-29 ENCOUNTER — Ambulatory Visit
Admission: RE | Admit: 2014-10-29 | Discharge: 2014-10-29 | Disposition: A | Discharge status: Home / Self Care | Payer: Medicare Other | Source: Ambulatory Visit

## 2014-10-29 DIAGNOSIS — Z1231 Encounter for screening mammogram for malignant neoplasm of breast: Secondary | ICD-10-CM | POA: Diagnosis not present

## 2014-11-02 ENCOUNTER — Other Ambulatory Visit: Payer: Self-pay | Admitting: Family Medicine

## 2014-11-02 DIAGNOSIS — R928 Other abnormal and inconclusive findings on diagnostic imaging of breast: Secondary | ICD-10-CM

## 2014-11-16 ENCOUNTER — Ambulatory Visit
Admission: RE | Admit: 2014-11-16 | Discharge: 2014-11-16 | Disposition: A | Discharge status: Home / Self Care | Payer: Medicare Other | Source: Ambulatory Visit | Attending: Family Medicine | Admitting: Family Medicine

## 2014-11-16 ENCOUNTER — Other Ambulatory Visit: Payer: Self-pay | Admitting: Family Medicine

## 2014-11-16 ENCOUNTER — Other Ambulatory Visit: Payer: Medicare Other

## 2014-11-16 DIAGNOSIS — R928 Other abnormal and inconclusive findings on diagnostic imaging of breast: Secondary | ICD-10-CM

## 2014-11-16 DIAGNOSIS — N63 Unspecified lump in breast: Secondary | ICD-10-CM | POA: Diagnosis not present

## 2014-11-16 DIAGNOSIS — N6489 Other specified disorders of breast: Secondary | ICD-10-CM | POA: Diagnosis not present

## 2014-11-18 DIAGNOSIS — G40909 Epilepsy, unspecified, not intractable, without status epilepticus: Secondary | ICD-10-CM | POA: Diagnosis not present

## 2014-11-18 DIAGNOSIS — E780 Pure hypercholesterolemia: Secondary | ICD-10-CM | POA: Diagnosis not present

## 2014-11-18 DIAGNOSIS — I10 Essential (primary) hypertension: Secondary | ICD-10-CM | POA: Diagnosis not present

## 2014-11-18 DIAGNOSIS — G4700 Insomnia, unspecified: Secondary | ICD-10-CM | POA: Diagnosis not present

## 2014-11-23 ENCOUNTER — Other Ambulatory Visit: Payer: Self-pay | Admitting: Family Medicine

## 2014-11-23 DIAGNOSIS — R928 Other abnormal and inconclusive findings on diagnostic imaging of breast: Secondary | ICD-10-CM

## 2014-11-26 ENCOUNTER — Ambulatory Visit
Admission: RE | Admit: 2014-11-26 | Discharge: 2014-11-26 | Disposition: A | Discharge status: Home / Self Care | Payer: Medicare Other | Source: Ambulatory Visit | Attending: Family Medicine | Admitting: Family Medicine

## 2014-11-26 DIAGNOSIS — N6001 Solitary cyst of right breast: Secondary | ICD-10-CM | POA: Diagnosis not present

## 2014-11-26 DIAGNOSIS — R928 Other abnormal and inconclusive findings on diagnostic imaging of breast: Secondary | ICD-10-CM | POA: Diagnosis not present

## 2014-11-26 DIAGNOSIS — N6011 Diffuse cystic mastopathy of right breast: Secondary | ICD-10-CM | POA: Diagnosis not present

## 2014-12-19 DIAGNOSIS — G40909 Epilepsy, unspecified, not intractable, without status epilepticus: Secondary | ICD-10-CM | POA: Diagnosis not present

## 2014-12-19 DIAGNOSIS — I10 Essential (primary) hypertension: Secondary | ICD-10-CM | POA: Diagnosis not present

## 2014-12-19 DIAGNOSIS — N289 Disorder of kidney and ureter, unspecified: Secondary | ICD-10-CM | POA: Diagnosis not present

## 2014-12-19 DIAGNOSIS — E780 Pure hypercholesterolemia: Secondary | ICD-10-CM | POA: Diagnosis not present

## 2015-02-17 DIAGNOSIS — D510 Vitamin B12 deficiency anemia due to intrinsic factor deficiency: Secondary | ICD-10-CM | POA: Diagnosis not present

## 2015-02-17 DIAGNOSIS — G40909 Epilepsy, unspecified, not intractable, without status epilepticus: Secondary | ICD-10-CM | POA: Diagnosis not present

## 2015-02-17 DIAGNOSIS — F4325 Adjustment disorder with mixed disturbance of emotions and conduct: Secondary | ICD-10-CM | POA: Diagnosis not present

## 2015-02-17 DIAGNOSIS — D649 Anemia, unspecified: Secondary | ICD-10-CM | POA: Diagnosis not present

## 2015-02-17 DIAGNOSIS — N289 Disorder of kidney and ureter, unspecified: Secondary | ICD-10-CM | POA: Diagnosis not present

## 2015-02-17 DIAGNOSIS — E780 Pure hypercholesterolemia: Secondary | ICD-10-CM | POA: Diagnosis not present

## 2015-02-17 DIAGNOSIS — I10 Essential (primary) hypertension: Secondary | ICD-10-CM | POA: Diagnosis not present

## 2015-04-07 ENCOUNTER — Ambulatory Visit (INDEPENDENT_AMBULATORY_CARE_PROVIDER_SITE_OTHER): Payer: Medicare Other | Admitting: Cardiovascular Disease

## 2015-04-07 ENCOUNTER — Encounter: Payer: Self-pay | Admitting: Cardiovascular Disease

## 2015-04-07 VITALS — BP 130/80 | HR 66 | Ht 59.5 in | Wt 179.8 lb

## 2015-04-07 DIAGNOSIS — E785 Hyperlipidemia, unspecified: Secondary | ICD-10-CM | POA: Diagnosis not present

## 2015-04-07 DIAGNOSIS — I10 Essential (primary) hypertension: Secondary | ICD-10-CM | POA: Diagnosis not present

## 2015-04-07 NOTE — Patient Instructions (Signed)
**Note De-identified  Obfuscation** Medication Instructions:  None  Labwork: None  Testing/Procedures: None  Follow-Up: Your physician wants you to follow-up in: 6 months. You will receive a reminder letter in the mail two months in advance. If you don't receive a letter, please call our office to schedule the follow-up appointment.      

## 2015-04-07 NOTE — Progress Notes (Signed)
Cardiology Office Note   Date:  04/07/2015   ID:  [name], DOB 02-06-1930, MRN [id number]  PCP:  [name], MD  Cardiologist:   Thayer Headings, MD   Chief Complaint  Patient presents with  . Hypertension   1. LBBB 2. Diastolic dysfunction 3. Anemia - received 2 units of PRBC last 4. HTN 5, PVD - renal artery stenosis   History of Present Illness: [name] is seen back today for a 10 day check. She has a chronic LBBB, remote stress testing in 2005 which was normal, HTN, PVD with past renal artery stenosis, PAF (but with no real documentation of this apparently), iron deficiency anemia, seizure disorder and prior subdural hematoma and a past GI bleed. She has had chronic chest pain and palpitations for many years. Echo back in 2010 showed normal LV function, mild LVH with mild hypertrophy and moderate obstruction of the outflow track.   I saw her earlier this month. BP was quite high. We have adjusted her medicines and updated her echo. She has severe LVH and is felt to have diastolic dysfunction. Hydralazine was increased. She had not taken any of her medicines prior to her last visit with me.   She comes back today. She is here alone. Doing ok. BP still high here today. She brought her cuff in to check. Her readings in the memory are reviewed and for the most part look good. She feels ok and actually says she feels better. Riding her bike at home.   June 23, 2013:  [name] was last seen by me in 2011. She was seen by [name] in March 2014. She is breathing OK. She continues to have problems with anemia and is breathing better since she had her transfusion. No CP . She has white coat HTN. She does not add salt.   She was accompanied by family.   March 16, 2014:  [name] presents for follow up . BP was elevated here but it was high here. BP readings are ok at home.   Oct. 6. 2015:  April 07, 2015: BP is a bit high. Has her son and his daughter  living with her ( getting his house renovated )  Takes her meds regularly,    Past Medical History  Diagnosis Date  . Hypertension   . Internal hemorrhoids   . Diverticulosis   . Seizure disorder   . Arthritis   . Left bundle branch block   . Palpitation   . PVD (peripheral vascular disease)   . PAF (paroxysmal atrial fibrillation)   . Iron deficiency anemia   . Seizure disorder   . Personal history of subdural hematoma   . Blood transfusion without reported diagnosis 07/2013    Past Surgical History  Procedure Laterality Date  . Laparoscopic appendectomy    . Knee arthroscopy    . Inguinal hernia repair    . Angioplasty  1999  . Laparoscopic cholecystectomy    . Carpal tunnel release    . Cataract extraction       Current Outpatient Prescriptions  Medication Sig Dispense Refill  . aspirin EC 81 MG tablet Take 81 mg by mouth daily.    . cloNIDine (CATAPRES) 0.1 MG tablet Take 0.1 mg by mouth 2 (two) times daily.    . clopidogrel (PLAVIX) 75 MG tablet Take 75 mg by mouth daily.     Marland Kitchen COLCRYS 0.6 MG tablet Take 0.6 mg by mouth daily.     . Iron-FA-B Cmp-C-Biot-Probiotic (  FUSION PLUS) CAPS Take by mouth daily.  4  . levETIRAcetam (KEPPRA) 500 MG tablet Take 500 mg by mouth 2 (two) times daily.    . Olmesartan-Amlodipine-HCTZ (TRIBENZOR) 40-5-25 MG TABS Take 1 tablet by mouth daily. 90 tablet 3   No current facility-administered medications for this visit.    Allergies:   Cephalexin; Haldol; and Lorazepam    Social History:  The patient  reports that she has quit smoking. She has never used smokeless tobacco. She reports that she does not drink alcohol or use illicit drugs.   Family History:  The patient's family history is not on file.    ROS:  Please see the history of present illness.    Review of Systems: Constitutional:  denies fever, chills, diaphoresis, appetite change and fatigue.  HEENT: denies photophobia, eye pain, redness, hearing loss, ear pain,  congestion, sore throat, rhinorrhea, sneezing, neck pain, neck stiffness and tinnitus.  Respiratory: denies SOB, DOE, cough, chest tightness, and wheezing.  Cardiovascular: denies chest pain, palpitations and leg swelling.  Gastrointestinal: denies nausea, vomiting, abdominal pain, diarrhea, constipation, blood in stool.  Genitourinary: denies dysuria, urgency, frequency, hematuria, flank pain and difficulty urinating.  Musculoskeletal: denies  myalgias, back pain, joint swelling, arthralgias and gait problem.   Skin: denies pallor, rash and wound.  Neurological: denies dizziness, seizures, syncope, weakness, light-headedness, numbness and headaches.   Hematological: denies adenopathy, easy bruising, personal or family bleeding history.  Psychiatric/ Behavioral: denies suicidal ideation, mood changes, confusion, nervousness, sleep disturbance and agitation.       All other systems are reviewed and negative.    PHYSICAL EXAM: VS:  BP 130/80 mmHg  Pulse 66  Ht 4' 11.5" (1.511 m)  Wt 179 lb 12.8 oz (81.557 kg)  BMI 35.72 kg/m2 , BMI Body mass index is 35.72 kg/(m^2). GEN: Well nourished, well developed, in no acute distress HEENT: normal Neck: no JVD, carotid bruits, or masses Cardiac: RRR; no murmurs, rubs, or gallops,no edema  Respiratory:  clear to auscultation bilaterally, normal work of breathing GI: soft, nontender, nondistended, + BS MS: no deformity or atrophy Skin: warm and dry, no rash Neuro:  Strength and sensation are intact Psych: normal   EKG:  EKG is ordered today. The ekg ordered today demonstrates :  NSR at 66. LBBB    Recent Labs: 09/11/2014: ALT 12; BUN 16; Creatinine 1.5*; Potassium 3.8; Sodium 137    Lipid Panel    Component Value Date/Time   CHOL 228* 09/11/2014 0850   TRIG 211.0* 09/11/2014 0850   HDL 41.50 09/11/2014 0850   CHOLHDL 5 09/11/2014 0850   VLDL 42.2* 09/11/2014 0850   LDLCALC  04/25/2010 0459    67        Total Cholesterol/HDL:CHD  Risk Coronary Heart Disease Risk Table                     Men   Women  1/2 Average Risk   3.4   3.3  Average Risk       5.0   4.4  2 X Average Risk   9.6   7.1  3 X Average Risk  23.4   11.0        Use the calculated Patient Ratio above and the CHD Risk Table to determine the patient's CHD Risk.        ATP III CLASSIFICATION (LDL):  <100     mg/dL   Optimal  100-129  mg/dL   Near or Above  Optimal  130-159  mg/dL   Borderline  160-189  mg/dL   High  >190     mg/dL   Very High   LDLDIRECT 123.2 09/11/2014 0850      Wt Readings from Last 3 Encounters:  04/07/15 179 lb 12.8 oz (81.557 kg)  09/15/14 183 lb (83.008 kg)  03/16/14 182 lb (82.555 kg)      Other studies Reviewed: Additional studies/ records that were reviewed today include: . Review of the above records demonstrates:    ASSESSMENT AND PLAN:  1. LBBB - her left ventricle systolic function is normal. 2. Diastolic dysfunction - her blood pressure is still a little bit high. He came down nicely after several minutes of sitting in the room. I've encouraged her to avoid eating any extra salt. I've encouraged her to exercise on a regular basis.  3. Anemia - received 2 units of PRBC last 4. HTN 5, PVD - renal artery stenosis - she does not have any abdominal bruits. We'll check her creatinine next visit. She also follows up with Dr. Criss Rosales. 6. LVH -    Current medicines are reviewed at length with the patient today.  The patient does not have concerns regarding medicines.  The following changes have been made:  no change  Labs/ tests ordered today include:   Orders Placed This Encounter  Procedures  . EKG 12-Lead     Disposition:   FU with me in 6 months     Signed, Nahser, Wonda Cheng, MD  04/07/2015 7:30 PM    [facility] [address], [place], [place]  [address] Phone: [phone]; Fax: [phone]

## 2015-06-18 DIAGNOSIS — D510 Vitamin B12 deficiency anemia due to intrinsic factor deficiency: Secondary | ICD-10-CM | POA: Diagnosis not present

## 2015-06-18 DIAGNOSIS — F4325 Adjustment disorder with mixed disturbance of emotions and conduct: Secondary | ICD-10-CM | POA: Diagnosis not present

## 2015-06-18 DIAGNOSIS — E780 Pure hypercholesterolemia: Secondary | ICD-10-CM | POA: Diagnosis not present

## 2015-06-18 DIAGNOSIS — G4700 Insomnia, unspecified: Secondary | ICD-10-CM | POA: Diagnosis not present

## 2015-06-18 DIAGNOSIS — E559 Vitamin D deficiency, unspecified: Secondary | ICD-10-CM | POA: Diagnosis not present

## 2015-06-18 DIAGNOSIS — I10 Essential (primary) hypertension: Secondary | ICD-10-CM | POA: Diagnosis not present

## 2015-06-18 DIAGNOSIS — N289 Disorder of kidney and ureter, unspecified: Secondary | ICD-10-CM | POA: Diagnosis not present

## 2015-07-30 DIAGNOSIS — F4325 Adjustment disorder with mixed disturbance of emotions and conduct: Secondary | ICD-10-CM | POA: Diagnosis not present

## 2015-07-30 DIAGNOSIS — I10 Essential (primary) hypertension: Secondary | ICD-10-CM | POA: Diagnosis not present

## 2015-07-30 DIAGNOSIS — E780 Pure hypercholesterolemia: Secondary | ICD-10-CM | POA: Diagnosis not present

## 2015-08-04 ENCOUNTER — Emergency Department (INDEPENDENT_AMBULATORY_CARE_PROVIDER_SITE_OTHER)
Admission: EM | Admit: 2015-08-04 | Discharge: 2015-08-04 | Disposition: A | Discharge status: Home / Self Care | Payer: Medicare Other | Source: Home / Self Care | Attending: Family Medicine | Admitting: Family Medicine

## 2015-08-04 ENCOUNTER — Encounter (HOSPITAL_COMMUNITY): Payer: Self-pay | Admitting: Emergency Medicine

## 2015-08-04 DIAGNOSIS — L03032 Cellulitis of left toe: Secondary | ICD-10-CM | POA: Diagnosis not present

## 2015-08-04 DIAGNOSIS — T148XXA Other injury of unspecified body region, initial encounter: Secondary | ICD-10-CM

## 2015-08-04 MED ORDER — TRAMADOL-ACETAMINOPHEN 37.5-325 MG PO TABS: 1.0000 | ORAL_TABLET | Freq: Four times a day (QID) | ORAL | Status: DC | PRN

## 2015-08-04 MED ORDER — CLINDAMYCIN HCL 300 MG PO CAPS: 300.0000 mg | ORAL_CAPSULE | Freq: Three times a day (TID) | ORAL | Status: DC

## 2015-08-04 NOTE — Discharge Instructions (Signed)
Cellulitis Warm water soaks Return if worse or not improving in 2 days Cellulitis is an infection of the skin and the tissue beneath it. The infected area is usually red and tender. Cellulitis occurs most often in the arms and lower legs.  CAUSES  Cellulitis is caused by bacteria that enter the skin through cracks or cuts in the skin. The most common types of bacteria that cause cellulitis are staphylococci and streptococci. SIGNS AND SYMPTOMS   Redness and warmth.  Swelling.  Tenderness or pain.  Fever. DIAGNOSIS  Your health care provider can usually determine what is wrong based on a physical exam. Blood tests may also be done. TREATMENT  Treatment usually involves taking an antibiotic medicine. HOME CARE INSTRUCTIONS   Take your antibiotic medicine as directed by your health care provider. Finish the antibiotic even if you start to feel better.  Keep the infected arm or leg elevated to reduce swelling.  Apply a warm cloth to the affected area up to 4 times per day to relieve pain.  Take medicines only as directed by your health care provider.  Keep all follow-up visits as directed by your health care provider. SEEK MEDICAL CARE IF:   You notice red streaks coming from the infected area.  Your red area gets larger or turns dark in color.  Your bone or joint underneath the infected area becomes painful after the skin has healed.  Your infection returns in the same area or another area.  You notice a swollen bump in the infected area.  You develop new symptoms.  You have a fever. SEEK IMMEDIATE MEDICAL CARE IF:   You feel very sleepy.  You develop vomiting or diarrhea.  You have a general ill feeling (malaise) with muscle aches and pains. MAKE SURE YOU:   Understand these instructions.  Will watch your condition.  Will get help right away if you are not doing well or get worse. Document Released: 09/06/2005 Document Revised: 04/13/2014 Document Reviewed:  02/12/2012 [facility] Patient Information 2015 [place], [place]. This information is not intended to replace advice given to you by your health care provider. Make sure you discuss any questions you have with your health care provider.  Hematoma A hematoma is a collection of blood. The collection of blood can turn into a hard, painful lump under the skin. Your skin may turn blue or yellow if the hematoma is close to the surface of the skin. Most hematomas get better in a few days to weeks. Some hematomas are serious and need medical care. Hematomas can be very small or very big. HOME CARE  Apply ice to the injured area:  Put ice in a plastic bag.  Place a towel between your skin and the bag.  Leave the ice on for 20 minutes, 2-3 times a day for the first 1 to 2 days.  After the first 2 days, switch to using warm packs on the injured area.  Raise (elevate) the injured area to lessen pain and puffiness (swelling). You may also wrap the area with an elastic bandage. Make sure the bandage is not wrapped too tight.  If you have a painful hematoma on your leg or foot, you may use crutches for a couple days.  Only take medicines as told by your doctor. GET HELP RIGHT AWAY IF:   Your pain gets worse.  Your pain is not controlled with medicine.  You have a fever.  Your puffiness gets worse.  Your skin turns more blue or  yellow.  Your skin over the hematoma breaks or starts bleeding.  Your hematoma is in your chest or belly (abdomen) and you are short of breath, feel weak, or have a change in consciousness.  Your hematoma is on your scalp and you have a headache that gets worse or a change in alertness or consciousness. MAKE SURE YOU:   Understand these instructions.  Will watch your condition.  Will get help right away if you are not doing well or get worse. Document Released: 01/04/2005 Document Revised: 07/30/2013 Document Reviewed: 05/07/2013 [facility] Patient Information 2015  [place], [place]. This information is not intended to replace advice given to you by your health care provider. Make sure you discuss any questions you have with your health care provider.

## 2015-08-04 NOTE — ED Notes (Signed)
Left foot pain, specifically middle toe is red and painful.  No known injury.  This started Saturday, but patient repeatedly mentions concern for daughter cutting her toe nails

## 2015-08-04 NOTE — ED Provider Notes (Signed)
CSN: [id number]     Arrival date & time 08/04/15  1303 History   First MD Initiated Contact with Patient 08/04/15 1318     Chief Complaint  Patient presents with  . Foot Pain   (Consider location/radiation/quality/duration/timing/severity/associated sxs/prior Treatment) HPI Comments: 81 year old female who noticed a swollen left third toe 4 days ago. It started turning red and becoming painful during this time. She denies any history of trauma. She did not stub her toe nothing struck her toes she did not hyperextended her hyper flexed it. She does mention that her family member tram turn nails a couple of weeks ago but there was no immediate adverse effect at that time. The pain is limited to the left third toe. It does not radiate. Movement makes it worse, palpation makes it worse Warm soaks helps minimally. There has been no drainage or bleeding.   Past Medical History  Diagnosis Date  . Hypertension   . Internal hemorrhoids   . Diverticulosis   . Seizure disorder   . Arthritis   . Left bundle branch block   . Palpitation   . PVD (peripheral vascular disease)   . PAF (paroxysmal atrial fibrillation)   . Iron deficiency anemia   . Seizure disorder   . Personal history of subdural hematoma   . Blood transfusion without reported diagnosis 07/2013   Past Surgical History  Procedure Laterality Date  . Laparoscopic appendectomy    . Knee arthroscopy    . Inguinal hernia repair    . Angioplasty  1999  . Laparoscopic cholecystectomy    . Carpal tunnel release    . Cataract extraction     History reviewed. No pertinent family history. Social History  Substance Use Topics  . Smoking status: Former Research scientist (life sciences)  . Smokeless tobacco: Never Used  . Alcohol Use: No   OB History    No data available     Review of Systems  Constitutional: Negative for fever and activity change.  Respiratory: Negative for cough and shortness of breath.   Cardiovascular: Negative for chest pain and  palpitations.  Gastrointestinal: Negative.   Musculoskeletal: Positive for joint swelling.       As per history of present illness  Skin: Positive for color change.  Neurological: Negative.     Allergies  Cephalexin; Haldol; and Lorazepam  Home Medications   Prior to Admission medications   Medication Sig Start Date End Date Taking? Authorizing Provider  ferrous sulfate 325 (65 FE) MG tablet Take 325 mg by mouth daily with breakfast.   Yes Historical Provider, MD  aspirin EC 81 MG tablet Take 81 mg by mouth daily.    Historical Provider, MD  clindamycin (CLEOCIN) 300 MG capsule Take 1 capsule (300 mg total) by mouth 3 (three) times daily. 08/04/15   Janne Napoleon, NP  cloNIDine (CATAPRES) 0.1 MG tablet Take 0.1 mg by mouth 2 (two) times daily.    Historical Provider, MD  clopidogrel (PLAVIX) 75 MG tablet Take 75 mg by mouth daily.  08/21/14   Historical Provider, MD  COLCRYS 0.6 MG tablet Take 0.6 mg by mouth daily.  09/10/14   Historical Provider, MD  Iron-FA-B Cmp-C-Biot-Probiotic (FUSION PLUS) CAPS Take by mouth daily. 03/01/15   Historical Provider, MD  levETIRAcetam (KEPPRA) 500 MG tablet Take 500 mg by mouth 2 (two) times daily.    Historical Provider, MD  Olmesartan-Amlodipine-HCTZ (TRIBENZOR) 40-5-25 MG TABS Take 1 tablet by mouth daily. 09/15/14   Thayer Headings, MD   BP  193/76 mmHg  Pulse 72  Temp(Src) 98.2 F (36.8 C) (Oral)  Resp 30  SpO2 98% Physical Exam  Constitutional: She is oriented to person, place, and time. She appears well-developed and well-nourished. No distress.  Eyes: EOM are normal.  Neck: Normal range of motion. Neck supple.  Cardiovascular: Normal rate.   Pulmonary/Chest: Effort normal. No respiratory distress.  Musculoskeletal:  There is erythema and swelling along the dorsal length of the left third toe. Marked tenderness. It palpates as soft and fluctuant. No drainage. No bleeding. Erythema does not extend into the foot. There is no erythema or swelling  extending into the nail or the plantar aspect of the toe.  Neurological: She is alert and oriented to person, place, and time. She exhibits normal muscle tone.  Skin: Skin is warm and dry.  Psychiatric: She has a normal mood and affect.  Nursing note and vitals reviewed.   ED Course  INCISION AND DRAINAGE Date/Time: 08/04/2015 2:07 PM Performed by: Marcha Dutton, Lister Brizzi Authorized by: Waldemar Dickens Consent: Verbal consent obtained. Risks and benefits: risks, benefits and alternatives were discussed Consent given by: patient Patient understanding: patient states understanding of the procedure being performed Patient identity confirmed: verbally with patient Type: hematoma Body area: lower extremity Location details: left third toe Anesthesia: local infiltration Local anesthetic: lidocaine 2% without epinephrine Anesthetic total: 0.7 ml Patient sedated: no Scalpel size: 11 Incision type: single straight Incision depth: dermal Complexity: simple Drainage: bloody Drainage amount: moderate Wound treatment: wound left open Patient tolerance: Patient tolerated the procedure well with no immediate complications Comments: There was scant amount of pus at the initial incision. Afterwards primarily blood was exsanguinated from the incision made tangentially along the longitudinal aspect of the dorsum of the toe.   (including critical care time) Labs Review Labs Reviewed - No data to display  Imaging Review No results found.   MDM   1. Cellulitis of toe of left foot   2. Hematoma    Patient reassures the examiner that there is no history of trauma. There is a hematoma rather than abscess over the dorsal aspect of the left third toe. This hematoma was drained after incision. A dressing is applied. She is encouraged to soak in warm water. She will take clindamycin 3 mg 3 times a day for 7 days. If not improving in 2 days she should return. Cellulitis Warm water soaks Return if worse or  not improving in 2 days   Janne Napoleon, NP 08/04/15 1414

## 2015-08-05 NOTE — ED Notes (Signed)
Daughter called, concerned about patient c/o nausea. Discussed w D Mabe, NP, who authorized Zofran 4 mg, PO, Q8H, w food , PRN nausea, #20 , NR. Called in to [place], [employer] at family request, spok w pharmacicst to [employer]

## 2015-08-20 ENCOUNTER — Telehealth: Payer: Self-pay | Admitting: *Deleted

## 2015-08-20 ENCOUNTER — Encounter: Payer: Self-pay | Admitting: *Deleted

## 2015-08-20 NOTE — Telephone Encounter (Signed)
spoke to pt and got her fm med hx.Marland KitchenMarland Kitchen

## 2015-08-25 ENCOUNTER — Ambulatory Visit (INDEPENDENT_AMBULATORY_CARE_PROVIDER_SITE_OTHER): Payer: Medicare Other | Admitting: Cardiovascular Disease

## 2015-08-25 ENCOUNTER — Encounter: Payer: Self-pay | Admitting: Cardiovascular Disease

## 2015-08-25 VITALS — BP 145/95 | HR 71 | Ht 59.5 in | Wt 181.8 lb

## 2015-08-25 DIAGNOSIS — I10 Essential (primary) hypertension: Secondary | ICD-10-CM | POA: Diagnosis not present

## 2015-08-25 LAB — BASIC METABOLIC PANEL
BUN: 25 mg/dL — ABNORMAL HIGH (ref 6–23)
CHLORIDE: 103 meq/L (ref 96–112)
CO2: 24 mEq/L (ref 19–32)
Calcium: 9.9 mg/dL (ref 8.4–10.5)
Creatinine, Ser: 1.51 mg/dL — ABNORMAL HIGH (ref 0.40–1.20)
GFR: 42.3 mL/min — AB (ref >60.00)
GLUCOSE: 81 mg/dL (ref 70–99)
POTASSIUM: 3.8 meq/L (ref 3.5–5.1)
SODIUM: 138 meq/L (ref 135–145)

## 2015-08-25 MED ORDER — SPIRONOLACTONE 25 MG PO TABS: 25.0000 mg | ORAL_TABLET | Freq: Every day | ORAL | Status: DC

## 2015-08-25 NOTE — Patient Instructions (Signed)
Medication Instructions:  START Aldactone (Spironolactone) 25 mg once daily   Labwork: TODAY - basic metabolic panel Your physician recommends that you return for lab work in: 1 week for basic metabolic panel    Testing/Procedures: None Ordered   Follow-Up: Your physician recommends that you schedule a follow-up appointment in: 6 weeks with [name].

## 2015-08-25 NOTE — Progress Notes (Signed)
Cardiology Office Note   Date:  08/25/2015   ID:  [name], DOB 03-26-1930, MRN [id number]  PCP:  [name], MD  Cardiologist:   Thayer Headings, MD   Chief Complaint  Patient presents with  . Follow-up    CHF   1. LBBB 2. Diastolic dysfunction 3. Anemia - received 2 units of PRBC last 4. HTN 5, PVD - renal artery stenosis   History of Present Illness: [name] is seen back today for a 10 day check. She has a chronic LBBB, remote stress testing in 2005 which was normal, HTN, PVD with past renal artery stenosis, PAF (but with no real documentation of this apparently), iron deficiency anemia, seizure disorder and prior subdural hematoma and a past GI bleed. She has had chronic chest pain and palpitations for many years. Echo back in 2010 showed normal LV function, mild LVH with mild hypertrophy and moderate obstruction of the outflow track.   I saw her earlier this month. BP was quite high. We have adjusted her medicines and updated her echo. She has severe LVH and is felt to have diastolic dysfunction. Hydralazine was increased. She had not taken any of her medicines prior to her last visit with me.   She comes back today. She is here alone. Doing ok. BP still high here today. She brought her cuff in to check. Her readings in the memory are reviewed and for the most part look good. She feels ok and actually says she feels better. Riding her bike at home.   June 23, 2013:  [name] was last seen by me in 2011. She was seen by [name] in March 2014. She is breathing OK. She continues to have problems with anemia and is breathing better since she had her transfusion. No CP . She has white coat HTN. She does not add salt.   She was accompanied by family.   March 16, 2014:  [name] presents for follow up . BP was elevated here but it was high here. BP readings are ok at home.   Oct. 6. 2015:  April 07, 2015: BP is a bit high. Has her son and his daughter  living with her ( getting his house renovated )  Takes her meds regularly,  Sept. 14, 2016:  BP is very elevated this am  Is under stress - has a nephew who is not expected to live much longer ( lung cancer)     Past Medical History  Diagnosis Date  . Hypertension   . Internal hemorrhoids   . Diverticulosis   . Seizure disorder   . Arthritis   . Left bundle branch block   . Palpitation   . PVD (peripheral vascular disease)   . PAF (paroxysmal atrial fibrillation)   . Iron deficiency anemia   . Seizure disorder   . Personal history of subdural hematoma   . Blood transfusion without reported diagnosis 07/2013    Past Surgical History  Procedure Laterality Date  . Laparoscopic appendectomy    . Knee arthroscopy    . Inguinal hernia repair    . Angioplasty  1999  . Laparoscopic cholecystectomy    . Carpal tunnel release    . Cataract extraction       Current Outpatient Prescriptions  Medication Sig Dispense Refill  . aspirin EC 81 MG tablet Take 81 mg by mouth daily.    . clindamycin (CLEOCIN) 300 MG capsule Take 1 capsule (300 mg total) by mouth 3 (three) times  daily. 21 capsule 0  . cloNIDine (CATAPRES) 0.1 MG tablet Take 0.1 mg by mouth 2 (two) times daily.    . clopidogrel (PLAVIX) 75 MG tablet Take 75 mg by mouth daily.     Marland Kitchen COLCRYS 0.6 MG tablet Take 0.6 mg by mouth daily.     . ferrous sulfate 325 (65 FE) MG tablet Take 325 mg by mouth daily with breakfast.    . Iron-FA-B Cmp-C-Biot-Probiotic (FUSION PLUS) CAPS Take by mouth daily.  4  . levETIRAcetam (KEPPRA) 500 MG tablet Take 500 mg by mouth 2 (two) times daily.    . Olmesartan-Amlodipine-HCTZ (TRIBENZOR) 40-5-25 MG TABS Take 1 tablet by mouth daily. 90 tablet 3  . traMADol-acetaminophen (ULTRACET) 37.5-325 MG per tablet Take 1 tablet by mouth every 6 (six) hours as needed. (Patient taking differently: Take 1 tablet by mouth every 6 (six) hours as needed for moderate pain or severe pain. ) 15 tablet 0   No  current facility-administered medications for this visit.    Allergies:   Cephalexin; Haldol; and Lorazepam    Social History:  The patient  reports that she has quit smoking. She has never used smokeless tobacco. She reports that she does not drink alcohol or use illicit drugs.   Family History:  The patient's family history includes Cancer in her father and sister; Hypertension in her mother; Other in her brother; Stroke in her mother.    ROS:  Please see the history of present illness.    Review of Systems: Constitutional:  denies fever, chills, diaphoresis, appetite change and fatigue.  HEENT: denies photophobia, eye pain, redness, hearing loss, ear pain, congestion, sore throat, rhinorrhea, sneezing, neck pain, neck stiffness and tinnitus.  Respiratory: denies SOB, DOE, cough, chest tightness, and wheezing.  Cardiovascular: denies chest pain, palpitations and leg swelling.  Gastrointestinal: denies nausea, vomiting, abdominal pain, diarrhea, constipation, blood in stool.  Genitourinary: denies dysuria, urgency, frequency, hematuria, flank pain and difficulty urinating.  Musculoskeletal: denies  myalgias, back pain, joint swelling, arthralgias and gait problem.   Skin: denies pallor, rash and wound.  Neurological: denies dizziness, seizures, syncope, weakness, light-headedness, numbness and headaches.   Hematological: denies adenopathy, easy bruising, personal or family bleeding history.  Psychiatric/ Behavioral: denies suicidal ideation, mood changes, confusion, nervousness, sleep disturbance and agitation.       All other systems are reviewed and negative.    PHYSICAL EXAM: VS:  BP 145/95 mmHg  Pulse 71  Ht 4' 11.5" (1.511 m)  Wt 82.464 kg (181 lb 12.8 oz)  BMI 36.12 kg/m2  SpO2 98% , BMI Body mass index is 36.12 kg/(m^2). GEN: Well nourished, well developed, in no acute distress HEENT: normal Neck: no JVD, carotid bruits, or masses Cardiac: RRR; no murmurs, rubs, or  gallops,no edema  Respiratory:  clear to auscultation bilaterally, normal work of breathing GI: soft, nontender, nondistended, + BS MS: no deformity or atrophy Skin: warm and dry, no rash Neuro:  Strength and sensation are intact Psych: normal   EKG:  EKG is ordered today. The ekg ordered today demonstrates :  NSR at 66. LBBB    Recent Labs: 09/11/2014: ALT 12; BUN 16; Creatinine, Ser 1.5*; Potassium 3.8; Sodium 137    Lipid Panel    Component Value Date/Time   CHOL 228* 09/11/2014 0850   TRIG 211.0* 09/11/2014 0850   HDL 41.50 09/11/2014 0850   CHOLHDL 5 09/11/2014 0850   VLDL 42.2* 09/11/2014 0850   LDLCALC  04/25/2010 0459    67  Total Cholesterol/HDL:CHD Risk Coronary Heart Disease Risk Table                     Men   Women  1/2 Average Risk   3.4   3.3  Average Risk       5.0   4.4  2 X Average Risk   9.6   7.1  3 X Average Risk  23.4   11.0        Use the calculated Patient Ratio above and the CHD Risk Table to determine the patient's CHD Risk.        ATP III CLASSIFICATION (LDL):  <100     mg/dL   Optimal  100-129  mg/dL   Near or Above                    Optimal  130-159  mg/dL   Borderline  160-189  mg/dL   High  >190     mg/dL   Very High   LDLDIRECT 123.2 09/11/2014 0850      Wt Readings from Last 3 Encounters:  08/25/15 82.464 kg (181 lb 12.8 oz)  04/07/15 81.557 kg (179 lb 12.8 oz)  09/15/14 83.008 kg (183 lb)      Other studies Reviewed: Additional studies/ records that were reviewed today include: . Review of the above records demonstrates:    ASSESSMENT AND PLAN:  1. LBBB - her left ventricle systolic function is normal. 2. Diastolic dysfunction - her blood pressure is still a little bit high. He came down nicely after several minutes of sitting in the room. I've encouraged her to avoid eating any extra salt. I've encouraged her to exercise on a regular basis.  3. Anemia - received 2 units of PRBC last 4. HTN- her blood  pressure remains normal. She's on high doses of various medications. We'll add Aldactone 25 mg a day. We'll check a basic medical profile today and will recheck it again in one week. 5, PVD - renal artery stenosis - she does not have any abdominal bruits. We'll check her creatinine next visit. She also follows up with Dr. Criss Rosales. 6. LVH -    Current medicines are reviewed at length with the patient today.  The patient does not have concerns regarding medicines.  The following changes have been made:  no change  Labs/ tests ordered today include:   No orders of the defined types were placed in this encounter.     Disposition:   FU with me in 6 weeks .     Signed, Nahser, Wonda Cheng, MD  08/25/2015 11:33 AM    [address], [place], [place]  [address] Phone: [phone]; Fax: [phone]

## 2015-08-31 DIAGNOSIS — F4325 Adjustment disorder with mixed disturbance of emotions and conduct: Secondary | ICD-10-CM | POA: Diagnosis not present

## 2015-08-31 DIAGNOSIS — G40909 Epilepsy, unspecified, not intractable, without status epilepticus: Secondary | ICD-10-CM | POA: Diagnosis not present

## 2015-08-31 DIAGNOSIS — L03116 Cellulitis of left lower limb: Secondary | ICD-10-CM | POA: Diagnosis not present

## 2015-08-31 DIAGNOSIS — I10 Essential (primary) hypertension: Secondary | ICD-10-CM | POA: Diagnosis not present

## 2015-09-01 ENCOUNTER — Other Ambulatory Visit (INDEPENDENT_AMBULATORY_CARE_PROVIDER_SITE_OTHER): Payer: Medicare Other

## 2015-09-01 DIAGNOSIS — I10 Essential (primary) hypertension: Secondary | ICD-10-CM | POA: Diagnosis not present

## 2015-09-01 LAB — BASIC METABOLIC PANEL
BUN: 25 mg/dL — AB (ref 6–23)
CALCIUM: 9.7 mg/dL (ref 8.4–10.5)
CO2: 28 mEq/L (ref 19–32)
CREATININE: 1.66 mg/dL — AB (ref 0.40–1.20)
Chloride: 104 mEq/L (ref 96–112)
GFR: 37.92 mL/min — AB (ref >60.00)
GLUCOSE: 95 mg/dL (ref 70–99)
Potassium: 4.8 mEq/L (ref 3.5–5.1)
Sodium: 138 mEq/L (ref 135–145)

## 2015-09-09 DIAGNOSIS — Z23 Encounter for immunization: Secondary | ICD-10-CM | POA: Diagnosis not present

## 2015-09-09 DIAGNOSIS — E780 Pure hypercholesterolemia: Secondary | ICD-10-CM | POA: Diagnosis not present

## 2015-09-09 DIAGNOSIS — F4325 Adjustment disorder with mixed disturbance of emotions and conduct: Secondary | ICD-10-CM | POA: Diagnosis not present

## 2015-09-09 DIAGNOSIS — G40909 Epilepsy, unspecified, not intractable, without status epilepticus: Secondary | ICD-10-CM | POA: Diagnosis not present

## 2015-09-09 DIAGNOSIS — L03116 Cellulitis of left lower limb: Secondary | ICD-10-CM | POA: Diagnosis not present

## 2015-09-09 DIAGNOSIS — N289 Disorder of kidney and ureter, unspecified: Secondary | ICD-10-CM | POA: Diagnosis not present

## 2015-09-10 DIAGNOSIS — M12272 Villonodular synovitis (pigmented), left ankle and foot: Secondary | ICD-10-CM | POA: Diagnosis not present

## 2015-09-10 DIAGNOSIS — M792 Neuralgia and neuritis, unspecified: Secondary | ICD-10-CM | POA: Diagnosis not present

## 2015-09-10 DIAGNOSIS — M79609 Pain in unspecified limb: Secondary | ICD-10-CM | POA: Diagnosis not present

## 2015-09-10 DIAGNOSIS — M65872 Other synovitis and tenosynovitis, left ankle and foot: Secondary | ICD-10-CM | POA: Diagnosis not present

## 2015-09-17 DIAGNOSIS — M792 Neuralgia and neuritis, unspecified: Secondary | ICD-10-CM | POA: Diagnosis not present

## 2015-09-17 DIAGNOSIS — M10072 Idiopathic gout, left ankle and foot: Secondary | ICD-10-CM | POA: Diagnosis not present

## 2015-09-23 ENCOUNTER — Other Ambulatory Visit: Payer: Self-pay

## 2015-09-23 DIAGNOSIS — Z1231 Encounter for screening mammogram for malignant neoplasm of breast: Secondary | ICD-10-CM

## 2015-09-24 DIAGNOSIS — G40909 Epilepsy, unspecified, not intractable, without status epilepticus: Secondary | ICD-10-CM | POA: Diagnosis not present

## 2015-09-24 DIAGNOSIS — N289 Disorder of kidney and ureter, unspecified: Secondary | ICD-10-CM | POA: Diagnosis not present

## 2015-09-24 DIAGNOSIS — Z6835 Body mass index (BMI) 35.0-35.9, adult: Secondary | ICD-10-CM | POA: Diagnosis not present

## 2015-09-24 DIAGNOSIS — K219 Gastro-esophageal reflux disease without esophagitis: Secondary | ICD-10-CM | POA: Diagnosis not present

## 2015-10-08 ENCOUNTER — Encounter: Payer: Self-pay | Admitting: Cardiovascular Disease

## 2015-10-08 ENCOUNTER — Ambulatory Visit (INDEPENDENT_AMBULATORY_CARE_PROVIDER_SITE_OTHER): Payer: Medicare Other | Admitting: Cardiovascular Disease

## 2015-10-08 VITALS — BP 136/74 | HR 56 | Ht 59.5 in | Wt 177.2 lb

## 2015-10-08 DIAGNOSIS — I10 Essential (primary) hypertension: Secondary | ICD-10-CM | POA: Diagnosis not present

## 2015-10-08 NOTE — Progress Notes (Signed)
Cardiology Office Note   Date:  10/08/2015   ID:  [name], DOB [birth year], MRN [id number]  PCP:  Geraldo Pitter, MD  Cardiologist:   Vesta Mixer, MD   Chief Complaint  Patient presents with  . Follow-up    HTN   1. LBBB 2. Diastolic dysfunction 3. Anemia - received 2 units of PRBC  4. HTN 5, PVD - renal artery stenosis   History of Present Illness: [name] is seen back today for a 10 day check. She has a chronic LBBB, remote stress testing in 2005 which was normal, HTN, PVD with past renal artery stenosis, PAF (but with no real documentation of this apparently), iron deficiency anemia, seizure disorder and prior subdural hematoma and a past GI bleed. She has had chronic chest pain and palpitations for many years. Echo back in 2010 showed normal LV function, mild LVH with mild hypertrophy and moderate obstruction of the outflow track.   I saw her earlier this month. BP was quite high. We have adjusted her medicines and updated her echo. She has severe LVH and is felt to have diastolic dysfunction. Hydralazine was increased. She had not taken any of her medicines prior to her last visit with me.   She comes back today. She is here alone. Doing ok. BP still high here today. She brought her cuff in to check. Her readings in the memory are reviewed and for the most part look good. She feels ok and actually says she feels better. Riding her bike at home.   June 23, 2013:  [name] was last seen by me in 2011. She was seen by Lawson Fiscal in March 2014. She is breathing OK. She continues to have problems with anemia and is breathing better since she had her transfusion. No CP . She has white coat HTN. She does not add salt.   She was accompanied by family.   March 16, 2014:  [name] presents for follow up . BP was elevated here but it was high here. BP readings are ok at home.   Oct. 6. 2015:  April 07, 2015: BP is a bit high. Has her son and his daughter  living with her ( getting his house renovated )  Takes her meds regularly,  Sept. 14, 2016:  BP is very elevated this am  Is under stress - has a nephew who is not expected to live much longer ( lung cancer)   Oct. 28, 2016:  Doing well.  We added Aldactone at her last visit.  She is feeling much better.  Has some fatigue with walking up stairs.  BP looks great . Avoiding salt .   Past Medical History  Diagnosis Date  . Hypertension   . Internal hemorrhoids   . Diverticulosis   . Seizure disorder (HCC)   . Arthritis   . Left bundle branch block   . Palpitation   . PVD (peripheral vascular disease) (HCC)   . PAF (paroxysmal atrial fibrillation) (HCC)   . Iron deficiency anemia   . Seizure disorder (HCC)   . Personal history of subdural hematoma   . Blood transfusion without reported diagnosis 07/2013    Past Surgical History  Procedure Laterality Date  . Laparoscopic appendectomy    . Knee arthroscopy    . Inguinal hernia repair    . Angioplasty  1999  . Laparoscopic cholecystectomy    . Carpal tunnel release    . Cataract extraction       Current  Outpatient Prescriptions  Medication Sig Dispense Refill  . aspirin EC 81 MG tablet Take 81 mg by mouth daily.    . clindamycin (CLEOCIN) 300 MG capsule Take 1 capsule (300 mg total) by mouth 3 (three) times daily. 21 capsule 0  . cloNIDine (CATAPRES) 0.1 MG tablet Take 0.1 mg by mouth 2 (two) times daily.    . clopidogrel (PLAVIX) 75 MG tablet Take 75 mg by mouth daily.     Marland Kitchen COLCRYS 0.6 MG tablet Take 0.6 mg by mouth daily.     Marland Kitchen esomeprazole (NEXIUM) 40 MG capsule Take 40 mg by mouth daily at 12 noon.    . ferrous sulfate 325 (65 FE) MG tablet Take 325 mg by mouth daily with breakfast.    . Iron-FA-B Cmp-C-Biot-Probiotic (FUSION PLUS) CAPS Take 1 tablet by mouth daily.   4  . levETIRAcetam (KEPPRA) 500 MG tablet Take 500 mg by mouth 2 (two) times daily.    . Olmesartan-Amlodipine-HCTZ (TRIBENZOR) 40-5-25 MG TABS Take  1 tablet by mouth daily. 90 tablet 3  . spironolactone (ALDACTONE) 25 MG tablet Take 1 tablet (25 mg total) by mouth daily. 31 tablet 11  . traMADol-acetaminophen (ULTRACET) 37.5-325 MG per tablet Take 1 tablet by mouth every 6 (six) hours as needed. (Patient taking differently: Take 1 tablet by mouth every 6 (six) hours as needed for moderate pain or severe pain. ) 15 tablet 0  . TRIBENZOR 40-10-12.5 MG TABS Take 1 tablet by mouth daily.     No current facility-administered medications for this visit.    Allergies:   Cephalexin; Haldol; and Lorazepam    Social History:  The patient  reports that she has quit smoking. She has never used smokeless tobacco. She reports that she does not drink alcohol or use illicit drugs.   Family History:  The patient's family history includes Cancer in her father and sister; Hypertension in her mother; Other in her brother; Stroke in her mother.    ROS:  Please see the history of present illness.    Review of Systems: Constitutional:  denies fever, chills, diaphoresis, appetite change and fatigue.  HEENT: denies photophobia, eye pain, redness, hearing loss, ear pain, congestion, sore throat, rhinorrhea, sneezing, neck pain, neck stiffness and tinnitus.  Respiratory: denies SOB, DOE, cough, chest tightness, and wheezing.  Cardiovascular: denies chest pain, palpitations and leg swelling.  Gastrointestinal: denies nausea, vomiting, abdominal pain, diarrhea, constipation, blood in stool.  Genitourinary: denies dysuria, urgency, frequency, hematuria, flank pain and difficulty urinating.  Musculoskeletal: denies  myalgias, back pain, joint swelling, arthralgias and gait problem.   Skin: denies pallor, rash and wound.  Neurological: denies dizziness, seizures, syncope, weakness, light-headedness, numbness and headaches.   Hematological: denies adenopathy, easy bruising, personal or family bleeding history.  Psychiatric/ Behavioral: denies suicidal ideation,  mood changes, confusion, nervousness, sleep disturbance and agitation.       All other systems are reviewed and negative.    PHYSICAL EXAM: VS:  BP 136/74 mmHg  Pulse 56  Ht 4' 11.5" (1.511 m)  Wt 177 lb 3.2 oz (80.377 kg)  BMI 35.20 kg/m2  SpO2 98% , BMI Body mass index is 35.2 kg/(m^2). GEN: Well nourished, well developed, in no acute distress HEENT: normal Neck: no JVD, carotid bruits, or masses Cardiac: RRR; no murmurs, rubs, or gallops,no edema  Respiratory:  clear to auscultation bilaterally, normal work of breathing GI: soft, nontender, nondistended, + BS MS: no deformity or atrophy Skin: warm and dry, no rash Neuro:  Strength and sensation are intact Psych: normal   EKG:  EKG is ordered today. The ekg ordered today demonstrates :  NSR at 66. LBBB    Recent Labs: 09/01/2015: BUN 25*; Creatinine, Ser 1.66*; Potassium 4.8; Sodium 138    Lipid Panel    Component Value Date/Time   CHOL 228* 09/11/2014 0850   TRIG 211.0* 09/11/2014 0850   HDL 41.50 09/11/2014 0850   CHOLHDL 5 09/11/2014 0850   VLDL 42.2* 09/11/2014 0850   LDLCALC  04/25/2010 0459    67        Total Cholesterol/HDL:CHD Risk Coronary Heart Disease Risk Table                     Men   Women  1/2 Average Risk   3.4   3.3  Average Risk       5.0   4.4  2 X Average Risk   9.6   7.1  3 X Average Risk  23.4   11.0        Use the calculated Patient Ratio above and the CHD Risk Table to determine the patient's CHD Risk.        ATP III CLASSIFICATION (LDL):  <100     mg/dL   Optimal  409-811  mg/dL   Near or Above                    Optimal  130-159  mg/dL   Borderline  914-782  mg/dL   High  >956     mg/dL   Very High   LDLDIRECT 123.2 09/11/2014 0850      Wt Readings from Last 3 Encounters:  10/08/15 177 lb 3.2 oz (80.377 kg)  08/25/15 181 lb 12.8 oz (82.464 kg)  04/07/15 179 lb 12.8 oz (81.557 kg)      Other studies Reviewed: Additional studies/ records that were reviewed today  include: . Review of the above records demonstrates:    ASSESSMENT AND PLAN:  1. LBBB - her left ventricle systolic function is normal. 2. Diastolic dysfunction - her blood pressure is still a little bit high. He came down nicely after several minutes of sitting in the room. I've encouraged her to avoid eating any extra salt. I've encouraged her to exercise on a regular basis.  3. Anemia - received 2 units of PRBC last 4. HTN- her blood pressure remains normal. She's on high doses of various medications.   She is doing much better on Aldactone. Continue current medications. I'll see her in 6 months. 5, PVD - renal artery stenosis - she does not have any abdominal bruits. We'll check her creatinine next visit. She also follows up with Dr. Parke Simmers. 6. LVH -    Current medicines are reviewed at length with the patient today.  The patient does not have concerns regarding medicines.  The following changes have been made:  no change  Labs/ tests ordered today include:   No orders of the defined types were placed in this encounter.     Disposition:   FU with me in 6 months     Signed, Montey Ebel, Deloris Ping, MD  10/08/2015 10:05 AM    [facility] [address] [place], [place], [place]  [address] Phone: [phone]; Fax: [phone]

## 2015-10-08 NOTE — Patient Instructions (Signed)
Medication Instructions:  Your physician recommends that you continue on your current medications as directed. Please refer to the Current Medication list given to you today.   Labwork: Your physician recommends that you return for lab work (basic metabolic panel) in: 6 months on the same day as your visit with [name]   Testing/Procedures: None Ordered   Follow-Up: Your physician wants you to follow-up in: 6 months with [name].  You will receive a reminder letter in the mail two months in advance. If you don't receive a letter, please call our office to schedule the follow-up appointment.   If you need a refill on your cardiac medications before your next appointment, please call your pharmacy.   Thank you for choosing [facility]! Christen Bame, RN [phone]

## 2015-11-01 ENCOUNTER — Ambulatory Visit
Admission: RE | Admit: 2015-11-01 | Discharge: 2015-11-01 | Disposition: A | Discharge status: Home / Self Care | Payer: Medicare Other | Source: Ambulatory Visit

## 2015-11-01 DIAGNOSIS — Z1231 Encounter for screening mammogram for malignant neoplasm of breast: Secondary | ICD-10-CM

## 2015-11-29 DIAGNOSIS — N289 Disorder of kidney and ureter, unspecified: Secondary | ICD-10-CM | POA: Diagnosis not present

## 2015-11-29 DIAGNOSIS — K219 Gastro-esophageal reflux disease without esophagitis: Secondary | ICD-10-CM | POA: Diagnosis not present

## 2015-11-29 DIAGNOSIS — F4325 Adjustment disorder with mixed disturbance of emotions and conduct: Secondary | ICD-10-CM | POA: Diagnosis not present

## 2015-11-29 DIAGNOSIS — G40909 Epilepsy, unspecified, not intractable, without status epilepticus: Secondary | ICD-10-CM | POA: Diagnosis not present

## 2015-12-08 ENCOUNTER — Emergency Department (HOSPITAL_COMMUNITY): Payer: Medicare Other

## 2015-12-08 ENCOUNTER — Inpatient Hospital Stay (HOSPITAL_COMMUNITY)
Admission: EM | Admit: 2015-12-08 | Discharge: 2015-12-14 | DRG: 683 | Disposition: A | Discharge status: Home Health | Payer: Medicare Other | Attending: Internal Medicine | Admitting: Internal Medicine

## 2015-12-08 ENCOUNTER — Encounter (HOSPITAL_COMMUNITY): Payer: Self-pay

## 2015-12-08 DIAGNOSIS — Z87891 Personal history of nicotine dependence: Secondary | ICD-10-CM

## 2015-12-08 DIAGNOSIS — N183 Chronic kidney disease, stage 3 (moderate): Secondary | ICD-10-CM | POA: Diagnosis present

## 2015-12-08 DIAGNOSIS — Z7902 Long term (current) use of antithrombotics/antiplatelets: Secondary | ICD-10-CM

## 2015-12-08 DIAGNOSIS — R197 Diarrhea, unspecified: Secondary | ICD-10-CM

## 2015-12-08 DIAGNOSIS — D509 Iron deficiency anemia, unspecified: Secondary | ICD-10-CM | POA: Diagnosis present

## 2015-12-08 DIAGNOSIS — I480 Paroxysmal atrial fibrillation: Secondary | ICD-10-CM | POA: Diagnosis present

## 2015-12-08 DIAGNOSIS — Z7982 Long term (current) use of aspirin: Secondary | ICD-10-CM

## 2015-12-08 DIAGNOSIS — R7989 Other specified abnormal findings of blood chemistry: Secondary | ICD-10-CM | POA: Diagnosis present

## 2015-12-08 DIAGNOSIS — Z79899 Other long term (current) drug therapy: Secondary | ICD-10-CM | POA: Diagnosis not present

## 2015-12-08 DIAGNOSIS — R112 Nausea with vomiting, unspecified: Secondary | ICD-10-CM | POA: Diagnosis not present

## 2015-12-08 DIAGNOSIS — Z66 Do not resuscitate: Secondary | ICD-10-CM | POA: Diagnosis not present

## 2015-12-08 DIAGNOSIS — R110 Nausea: Secondary | ICD-10-CM | POA: Diagnosis not present

## 2015-12-08 DIAGNOSIS — D259 Leiomyoma of uterus, unspecified: Secondary | ICD-10-CM | POA: Diagnosis not present

## 2015-12-08 DIAGNOSIS — E876 Hypokalemia: Secondary | ICD-10-CM | POA: Diagnosis not present

## 2015-12-08 DIAGNOSIS — K5641 Fecal impaction: Secondary | ICD-10-CM | POA: Diagnosis present

## 2015-12-08 DIAGNOSIS — Z515 Encounter for palliative care: Secondary | ICD-10-CM | POA: Diagnosis not present

## 2015-12-08 DIAGNOSIS — I129 Hypertensive chronic kidney disease with stage 1 through stage 4 chronic kidney disease, or unspecified chronic kidney disease: Secondary | ICD-10-CM | POA: Diagnosis present

## 2015-12-08 DIAGNOSIS — N189 Chronic kidney disease, unspecified: Secondary | ICD-10-CM | POA: Diagnosis not present

## 2015-12-08 DIAGNOSIS — R05 Cough: Secondary | ICD-10-CM | POA: Diagnosis present

## 2015-12-08 DIAGNOSIS — E872 Acidosis: Secondary | ICD-10-CM | POA: Diagnosis not present

## 2015-12-08 DIAGNOSIS — Z8249 Family history of ischemic heart disease and other diseases of the circulatory system: Secondary | ICD-10-CM | POA: Diagnosis not present

## 2015-12-08 DIAGNOSIS — I739 Peripheral vascular disease, unspecified: Secondary | ICD-10-CM | POA: Diagnosis present

## 2015-12-08 DIAGNOSIS — I447 Left bundle-branch block, unspecified: Secondary | ICD-10-CM | POA: Diagnosis present

## 2015-12-08 DIAGNOSIS — Z888 Allergy status to other drugs, medicaments and biological substances status: Secondary | ICD-10-CM | POA: Diagnosis not present

## 2015-12-08 DIAGNOSIS — N179 Acute kidney failure, unspecified: Secondary | ICD-10-CM | POA: Diagnosis not present

## 2015-12-08 DIAGNOSIS — Z881 Allergy status to other antibiotic agents status: Secondary | ICD-10-CM

## 2015-12-08 DIAGNOSIS — K219 Gastro-esophageal reflux disease without esophagitis: Secondary | ICD-10-CM | POA: Diagnosis present

## 2015-12-08 DIAGNOSIS — M1990 Unspecified osteoarthritis, unspecified site: Secondary | ICD-10-CM | POA: Diagnosis present

## 2015-12-08 DIAGNOSIS — Z823 Family history of stroke: Secondary | ICD-10-CM | POA: Diagnosis not present

## 2015-12-08 DIAGNOSIS — B349 Viral infection, unspecified: Secondary | ICD-10-CM | POA: Diagnosis present

## 2015-12-08 DIAGNOSIS — E870 Hyperosmolality and hypernatremia: Secondary | ICD-10-CM | POA: Diagnosis present

## 2015-12-08 DIAGNOSIS — I248 Other forms of acute ischemic heart disease: Secondary | ICD-10-CM | POA: Diagnosis present

## 2015-12-08 DIAGNOSIS — J209 Acute bronchitis, unspecified: Secondary | ICD-10-CM | POA: Diagnosis not present

## 2015-12-08 DIAGNOSIS — I2510 Atherosclerotic heart disease of native coronary artery without angina pectoris: Secondary | ICD-10-CM | POA: Diagnosis present

## 2015-12-08 DIAGNOSIS — R778 Other specified abnormalities of plasma proteins: Secondary | ICD-10-CM | POA: Diagnosis present

## 2015-12-08 DIAGNOSIS — Z809 Family history of malignant neoplasm, unspecified: Secondary | ICD-10-CM | POA: Diagnosis not present

## 2015-12-08 DIAGNOSIS — Z96653 Presence of artificial knee joint, bilateral: Secondary | ICD-10-CM | POA: Diagnosis present

## 2015-12-08 DIAGNOSIS — R0602 Shortness of breath: Secondary | ICD-10-CM

## 2015-12-08 DIAGNOSIS — R531 Weakness: Secondary | ICD-10-CM | POA: Insufficient documentation

## 2015-12-08 DIAGNOSIS — I10 Essential (primary) hypertension: Secondary | ICD-10-CM

## 2015-12-08 DIAGNOSIS — E860 Dehydration: Secondary | ICD-10-CM

## 2015-12-08 DIAGNOSIS — R1110 Vomiting, unspecified: Secondary | ICD-10-CM

## 2015-12-08 DIAGNOSIS — G40909 Epilepsy, unspecified, not intractable, without status epilepticus: Secondary | ICD-10-CM | POA: Diagnosis present

## 2015-12-08 DIAGNOSIS — R630 Anorexia: Secondary | ICD-10-CM | POA: Diagnosis not present

## 2015-12-08 DIAGNOSIS — E875 Hyperkalemia: Secondary | ICD-10-CM | POA: Diagnosis not present

## 2015-12-08 LAB — TSH: TSH: 0.475 u[IU]/mL (ref 0.350–4.500)

## 2015-12-08 LAB — CBC WITH DIFFERENTIAL/PLATELET
BASOS PCT: 0 %
Basophils Absolute: 0 10*3/uL (ref 0.0–0.1)
EOS ABS: 0 10*3/uL (ref 0.0–0.7)
Eosinophils Relative: 0 %
HCT: 32.7 % — ABNORMAL LOW (ref 36.0–46.0)
Hemoglobin: 10.8 g/dL — ABNORMAL LOW (ref 12.0–15.0)
Lymphocytes Relative: 10 %
Lymphs Abs: 1.2 10*3/uL (ref 0.7–4.0)
MCH: 28.7 pg (ref 26.0–34.0)
MCHC: 33 g/dL (ref 30.0–36.0)
MCV: 87 fL (ref 78.0–100.0)
MONO ABS: 0.6 10*3/uL (ref 0.1–1.0)
MONOS PCT: 5 %
Neutro Abs: 9.9 10*3/uL — ABNORMAL HIGH (ref 1.7–7.7)
Neutrophils Relative %: 85 %
Platelets: 303 10*3/uL (ref 150–400)
RBC: 3.76 MIL/uL — ABNORMAL LOW (ref 3.87–5.11)
RDW: 13.8 % (ref 11.5–15.5)
WBC: 11.7 10*3/uL — ABNORMAL HIGH (ref 4.0–10.5)

## 2015-12-08 LAB — C DIFFICILE QUICK SCREEN W PCR REFLEX
C DIFFICILE (CDIFF) INTERP: NEGATIVE
C DIFFICILE (CDIFF) TOXIN: NEGATIVE
C DIFFICLE (CDIFF) ANTIGEN: NEGATIVE

## 2015-12-08 LAB — COMPREHENSIVE METABOLIC PANEL
ALBUMIN: 4.7 g/dL (ref 3.5–5.0)
ALT: 19 U/L (ref 14–54)
ANION GAP: 17 — AB (ref 5–15)
AST: 36 U/L (ref 15–41)
Alkaline Phosphatase: 111 U/L (ref 38–126)
BILIRUBIN TOTAL: 1.5 mg/dL — AB (ref 0.3–1.2)
BUN: 60 mg/dL — AB (ref 6–20)
CHLORIDE: 110 mmol/L (ref 101–111)
CO2: 9 mmol/L — ABNORMAL LOW (ref 22–32)
Calcium: 9.9 mg/dL (ref 8.9–10.3)
Creatinine, Ser: 4.56 mg/dL — ABNORMAL HIGH (ref 0.44–1.00)
GFR calc Af Amer: 9 mL/min — ABNORMAL LOW (ref >60)
GFR calc non Af Amer: 8 mL/min — ABNORMAL LOW (ref >60)
GLUCOSE: 95 mg/dL (ref 65–99)
POTASSIUM: 7.1 mmol/L — AB (ref 3.5–5.1)
Sodium: 136 mmol/L (ref 135–145)
TOTAL PROTEIN: 7.6 g/dL (ref 6.5–8.1)

## 2015-12-08 LAB — TROPONIN I: TROPONIN I: 0.11 ng/mL — AB (ref <0.031)

## 2015-12-08 LAB — BASIC METABOLIC PANEL
Anion gap: 14 (ref 5–15)
BUN: 52 mg/dL — AB (ref 6–20)
CALCIUM: 9.4 mg/dL (ref 8.9–10.3)
CO2: 10 mmol/L — ABNORMAL LOW (ref 22–32)
CREATININE: 3.72 mg/dL — AB (ref 0.44–1.00)
Chloride: 117 mmol/L — ABNORMAL HIGH (ref 101–111)
GFR calc Af Amer: 12 mL/min — ABNORMAL LOW (ref >60)
GFR, EST NON AFRICAN AMERICAN: 10 mL/min — AB (ref >60)
Glucose, Bld: 86 mg/dL (ref 65–99)
Potassium: 6.1 mmol/L (ref 3.5–5.1)
SODIUM: 141 mmol/L (ref 135–145)

## 2015-12-08 LAB — MRSA PCR SCREENING: MRSA by PCR: NEGATIVE

## 2015-12-08 LAB — URINALYSIS, ROUTINE W REFLEX MICROSCOPIC
GLUCOSE, UA: NEGATIVE mg/dL
KETONES UR: 15 mg/dL — AB
NITRITE: NEGATIVE
PROTEIN: 100 mg/dL — AB
Specific Gravity, Urine: 1.015 (ref 1.005–1.030)
pH: 5 (ref 5.0–8.0)

## 2015-12-08 LAB — CBC
HCT: 38 % (ref 36.0–46.0)
Hemoglobin: 12.6 g/dL (ref 12.0–15.0)
MCH: 29 pg (ref 26.0–34.0)
MCHC: 33.2 g/dL (ref 30.0–36.0)
MCV: 87.6 fL (ref 78.0–100.0)
PLATELETS: 461 10*3/uL — AB (ref 150–400)
RBC: 4.34 MIL/uL (ref 3.87–5.11)
RDW: 13.8 % (ref 11.5–15.5)
WBC: 10.1 10*3/uL (ref 4.0–10.5)

## 2015-12-08 LAB — PHOSPHORUS: Phosphorus: 4.8 mg/dL — ABNORMAL HIGH (ref 2.5–4.6)

## 2015-12-08 LAB — URINE MICROSCOPIC-ADD ON: RBC / HPF: NONE SEEN RBC/hpf (ref 0–5)

## 2015-12-08 LAB — LIPASE, BLOOD: LIPASE: 34 U/L (ref 11–51)

## 2015-12-08 LAB — I-STAT CG4 LACTIC ACID, ED: LACTIC ACID, VENOUS: 1.78 mmol/L (ref 0.5–2.0)

## 2015-12-08 LAB — MAGNESIUM: MAGNESIUM: 1.3 mg/dL — AB (ref 1.7–2.4)

## 2015-12-08 LAB — POTASSIUM: Potassium: 7 mmol/L (ref 3.5–5.1)

## 2015-12-08 MED ORDER — ONDANSETRON HCL 4 MG/2ML IJ SOLN: 4.0000 mg | Freq: Three times a day (TID) | INTRAMUSCULAR | Status: DC | PRN

## 2015-12-08 MED ORDER — SODIUM POLYSTYRENE SULFONATE 15 GM/60ML PO SUSP
60.0000 g | Freq: Once | ORAL | Status: AC
  Administered 2015-12-08: 60 g via ORAL
  Filled 2015-12-08: qty 240

## 2015-12-08 MED ORDER — FUSION PLUS PO CAPS: 1.0000 | ORAL_CAPSULE | Freq: Every day | ORAL | Status: DC

## 2015-12-08 MED ORDER — HYDROMORPHONE HCL 1 MG/ML IJ SOLN: 1.0000 mg | INTRAMUSCULAR | Status: DC | PRN

## 2015-12-08 MED ORDER — CLOPIDOGREL BISULFATE 75 MG PO TABS
75.0000 mg | ORAL_TABLET | Freq: Every day | ORAL | Status: DC
  Administered 2015-12-09 – 2015-12-14 (×6): 75 mg via ORAL
  Filled 2015-12-08 (×7): qty 1

## 2015-12-08 MED ORDER — HYDRALAZINE HCL 20 MG/ML IJ SOLN
10.0000 mg | Freq: Once | INTRAMUSCULAR | Status: AC
  Administered 2015-12-08: 10 mg via INTRAVENOUS
  Filled 2015-12-08 (×2): qty 1

## 2015-12-08 MED ORDER — ONDANSETRON HCL 4 MG PO TABS: 4.0000 mg | ORAL_TABLET | Freq: Four times a day (QID) | ORAL | Status: DC | PRN

## 2015-12-08 MED ORDER — SPIRONOLACTONE 25 MG PO TABS: 25.0000 mg | ORAL_TABLET | Freq: Every day | ORAL | Status: DC

## 2015-12-08 MED ORDER — COLCHICINE 0.6 MG PO TABS
0.3000 mg | ORAL_TABLET | Freq: Every day | ORAL | Status: DC
  Administered 2015-12-09 – 2015-12-14 (×6): 0.3 mg via ORAL
  Filled 2015-12-08 (×7): qty 0.5

## 2015-12-08 MED ORDER — SODIUM CHLORIDE 0.9 % IV SOLN
1.0000 g | Freq: Once | INTRAVENOUS | Status: DC
  Filled 2015-12-08: qty 10

## 2015-12-08 MED ORDER — ACETAMINOPHEN 650 MG RE SUPP: 650.0000 mg | Freq: Four times a day (QID) | RECTAL | Status: DC | PRN

## 2015-12-08 MED ORDER — CLONIDINE HCL 0.1 MG PO TABS
0.1000 mg | ORAL_TABLET | Freq: Two times a day (BID) | ORAL | Status: DC
  Administered 2015-12-08 – 2015-12-09 (×2): 0.1 mg via ORAL
  Filled 2015-12-08 (×2): qty 1

## 2015-12-08 MED ORDER — SODIUM CHLORIDE 0.9 % IV BOLUS (SEPSIS)
1000.0000 mL | Freq: Once | INTRAVENOUS | Status: AC
  Administered 2015-12-08: 1000 mL via INTRAVENOUS

## 2015-12-08 MED ORDER — ONDANSETRON HCL 4 MG/2ML IJ SOLN
4.0000 mg | Freq: Four times a day (QID) | INTRAMUSCULAR | Status: DC | PRN
  Administered 2015-12-08: 4 mg via INTRAVENOUS
  Filled 2015-12-08: qty 2

## 2015-12-08 MED ORDER — ONDANSETRON 4 MG PO TBDP
4.0000 mg | ORAL_TABLET | Freq: Once | ORAL | Status: AC
  Administered 2015-12-08: 4 mg via ORAL
  Filled 2015-12-08: qty 1

## 2015-12-08 MED ORDER — FUROSEMIDE 40 MG PO TABS
40.0000 mg | ORAL_TABLET | Freq: Once | ORAL | Status: AC
  Administered 2015-12-08: 40 mg via ORAL
  Filled 2015-12-08: qty 1

## 2015-12-08 MED ORDER — ACETAMINOPHEN 325 MG PO TABS: 650.0000 mg | ORAL_TABLET | Freq: Four times a day (QID) | ORAL | Status: DC | PRN

## 2015-12-08 MED ORDER — COLCHICINE 0.6 MG PO TABS: 0.6000 mg | ORAL_TABLET | Freq: Every day | ORAL | Status: DC

## 2015-12-08 MED ORDER — LEVETIRACETAM 500 MG PO TABS
500.0000 mg | ORAL_TABLET | Freq: Two times a day (BID) | ORAL | Status: DC
  Administered 2015-12-08 – 2015-12-14 (×12): 500 mg via ORAL
  Filled 2015-12-08 (×13): qty 1

## 2015-12-08 MED ORDER — SODIUM CHLORIDE 0.9 % IV SOLN
INTRAVENOUS | Status: DC
  Administered 2015-12-08: 19:00:00 via INTRAVENOUS
  Administered 2015-12-10: 1000 mL via INTRAVENOUS
  Administered 2015-12-11: 10:00:00 via INTRAVENOUS

## 2015-12-08 MED ORDER — ASPIRIN EC 81 MG PO TBEC
81.0000 mg | DELAYED_RELEASE_TABLET | Freq: Every day | ORAL | Status: DC
  Administered 2015-12-09 – 2015-12-14 (×6): 81 mg via ORAL
  Filled 2015-12-08 (×7): qty 1

## 2015-12-08 MED ORDER — INSULIN ASPART 100 UNIT/ML IV SOLN
10.0000 [IU] | Freq: Once | INTRAVENOUS | Status: DC
  Filled 2015-12-08: qty 0.1

## 2015-12-08 MED ORDER — FERROUS SULFATE 325 (65 FE) MG PO TABS
325.0000 mg | ORAL_TABLET | Freq: Every day | ORAL | Status: DC
  Administered 2015-12-09 – 2015-12-14 (×6): 325 mg via ORAL
  Filled 2015-12-08 (×7): qty 1

## 2015-12-08 MED ORDER — SODIUM CHLORIDE 0.9 % IV SOLN
1.0000 g | Freq: Once | INTRAVENOUS | Status: AC
  Administered 2015-12-08: 1 g via INTRAVENOUS
  Filled 2015-12-08: qty 10

## 2015-12-08 MED ORDER — SODIUM CHLORIDE 0.9 % IV BOLUS (SEPSIS)
1000.0000 mL | Freq: Once | INTRAVENOUS | Status: AC
Start: 2015-12-08 — End: 2015-12-08
  Administered 2015-12-08: 1000 mL via INTRAVENOUS

## 2015-12-08 MED ORDER — TAB-A-VITE/IRON PO TABS
1.0000 | ORAL_TABLET | Freq: Every day | ORAL | Status: DC
  Administered 2015-12-09 – 2015-12-14 (×6): 1 via ORAL
  Filled 2015-12-08 (×7): qty 1

## 2015-12-08 MED ORDER — SODIUM CHLORIDE 0.9 % IJ SOLN
3.0000 mL | Freq: Two times a day (BID) | INTRAMUSCULAR | Status: DC
  Administered 2015-12-08 – 2015-12-14 (×5): 3 mL via INTRAVENOUS

## 2015-12-08 MED ORDER — DEXTROSE 50 % IV SOLN: 1.0000 | Freq: Once | INTRAVENOUS | Status: DC

## 2015-12-08 MED ORDER — ALBUTEROL SULFATE (2.5 MG/3ML) 0.083% IN NEBU
2.5000 mg | INHALATION_SOLUTION | Freq: Once | RESPIRATORY_TRACT | Status: AC
  Administered 2015-12-08: 2.5 mg via RESPIRATORY_TRACT
  Filled 2015-12-08: qty 3

## 2015-12-08 NOTE — Progress Notes (Signed)
Pt arrived to SDU without proper IV access with potassium of 7 which was not reepated in ED. I have requested that the potassium be at least 6 prior to the transfer to SDU Asked charge nurse to have ER doctor place central line if possible E link not avaialble at this time to place IV access  While awaiting IV access give kayexalate, lasix 40 mg PO   Leisa Lenz [facility] [id number]

## 2015-12-08 NOTE — ED Notes (Signed)
Pt attempted to urinate with no success.

## 2015-12-08 NOTE — Progress Notes (Signed)
EDCM spoke to patient at bedside.  Patient lives alone.  Patient reports she has a son and a daughter who live in [place].  Son Maudie Mercury [phone] and Langley Gauss [phone].  Patient reports she does not have any home health services at this time.  She has had home health services in the past but she cannot remember the name of the agency.  Patient reports she is able to complete her ADL's on her own without difficulty.  Patient has a walker, cane, wheelchair and shower bench at home.  Patient reports whenever she needs to go somewhere, her children take her, doctors appointments etc.  Patient reports her daughter cooks for her and brings it to her.  Patient reports she does not feel like she needs home health services at this time.  No further EDCm needs at this time.

## 2015-12-08 NOTE — ED Provider Notes (Signed)
CSN: [id number]     Arrival date & time 12/08/15  1043 History   First MD Initiated Contact with Patient 12/08/15 1244     Chief Complaint  Patient presents with  . Nausea     (Consider location/radiation/quality/duration/timing/severity/associated sxs/prior Treatment) HPI   [name] is a 81 y.o. female, with a history of hypertension, LBBB, and PAF, presenting to the ED with nausea, vomiting, and diarrhea for the past week. Pt adds that she has had chronic nausea for the past few months, was diagnosed with GERD, and prescribed Nexium. One episode of emesis PTA today and once here in the ED. Pt denies fever/chills, chest pain, shortness of breath, syncope, dizziness/lightheadedness, abdominal pain, or any other complaints. Patient states she has no history of kidney dysfunction. Patient also confirms that she has not been able to keep any food or liquids down for the past week.    Past Medical History  Diagnosis Date  . Hypertension   . Internal hemorrhoids   . Diverticulosis   . Seizure disorder ([place])   . Arthritis   . Left bundle branch block   . Palpitation   . PVD (peripheral vascular disease) ([place])   . PAF (paroxysmal atrial fibrillation) ([place])   . Iron deficiency anemia   . Seizure disorder ([place])   . Personal history of subdural hematoma   . Blood transfusion without reported diagnosis 07/2013  . Coronary artery disease   . History of nocturia    Past Surgical History  Procedure Laterality Date  . Laparoscopic appendectomy    . Knee arthroscopy    . Angioplasty  1999  . Laparoscopic cholecystectomy    . Carpal tunnel release    . Cataract extraction    . Cardiac catheterization    . Coronary angioplasty    . Colonoscopy    . Colonoscopy w/ biopsies    . Joint replacement    . Bilateral total knee replacements      Left Knee-12/29/2003, Right Knee-04/10/2002  . Right total hip athroplasty  01/23/2002  . Right shoulder arthroscopy  12/18/2006  . Major duct  excision of left breast  06/01/2004  . Breast surgery    . Inguinal hernia repair  08/08/2006    RIGHT INGUINAL HERNIA REPAIR WITH MESH  . Tonsillectomy     Family History  Problem Relation Age of Onset  . Hypertension Mother   . Stroke Mother   . Cancer Father   . Cancer Sister   . Other Brother     brain tumor   Social History  Substance Use Topics  . Smoking status: Former Research scientist (life sciences)  . Smokeless tobacco: Never Used  . Alcohol Use: No   OB History    No data available     Review of Systems  Constitutional: Negative for fever and chills.  Respiratory: Negative for shortness of breath.   Cardiovascular: Negative for chest pain.  Gastrointestinal: Positive for nausea, vomiting and diarrhea. Negative for blood in stool.  Genitourinary: Negative for dysuria.  Neurological: Negative for dizziness, tremors, syncope, weakness and light-headedness.  All other systems reviewed and are negative.     Allergies  Cephalexin; Haldol; and Lorazepam  Home Medications   Prior to Admission medications   Medication Sig Start Date End Date Taking? Authorizing Provider  aspirin EC 81 MG tablet Take 81 mg by mouth daily.   Yes Historical Provider, MD  cloNIDine (CATAPRES) 0.1 MG tablet Take 0.1 mg by mouth 2 (two) times daily.   Yes  Historical Provider, MD  clopidogrel (PLAVIX) 75 MG tablet Take 75 mg by mouth daily.  08/21/14  Yes Historical Provider, MD  COLCRYS 0.6 MG tablet Take 0.6 mg by mouth daily.  09/10/14  Yes Historical Provider, MD  dexlansoprazole (DEXILANT) 60 MG capsule Take 60 mg by mouth daily.   Yes Historical Provider, MD  ferrous sulfate 325 (65 FE) MG tablet Take 325 mg by mouth daily with breakfast.   Yes Historical Provider, MD  Iron-FA-B Cmp-C-Biot-Probiotic (FUSION PLUS) CAPS Take 1 tablet by mouth daily.  03/01/15  Yes Historical Provider, MD  levETIRAcetam (KEPPRA) 500 MG tablet Take 500 mg by mouth 2 (two) times daily.   Yes Historical Provider, MD  spironolactone  (ALDACTONE) 25 MG tablet Take 1 tablet (25 mg total) by mouth daily. 08/25/15  Yes Thayer Headings, MD  TRIBENZOR 40-10-12.5 MG TABS Take 1 tablet by mouth daily. 10/06/15  Yes Historical Provider, MD  clindamycin (CLEOCIN) 300 MG capsule Take 1 capsule (300 mg total) by mouth 3 (three) times daily. Patient not taking: Reported on 12/08/2015 08/04/15   Janne Napoleon, NP  Olmesartan-Amlodipine-HCTZ [facility]) 40-5-25 MG TABS Take 1 tablet by mouth daily. Patient not taking: Reported on 12/08/2015 09/15/14   Thayer Headings, MD  traMADol-acetaminophen (ULTRACET) 37.5-325 MG per tablet Take 1 tablet by mouth every 6 (six) hours as needed. Patient not taking: Reported on 12/08/2015 08/04/15   Janne Napoleon, NP   BP 168/88 mmHg  Pulse 89  Temp(Src) 98.1 F (36.7 C) (Oral)  Resp 29  SpO2 96% Physical Exam  Constitutional: She is oriented to person, place, and time. She appears well-developed and well-nourished. No distress.  HENT:  Head: Normocephalic and atraumatic.  Eyes: Conjunctivae are normal. Pupils are equal, round, and reactive to light.  Cardiovascular: Normal rate, regular rhythm and normal heart sounds.   Pulmonary/Chest: Breath sounds normal. Tachypnea noted. No respiratory distress.  Abdominal: Soft. Bowel sounds are normal. There is no tenderness.  Musculoskeletal: She exhibits no edema or tenderness.  Lymphadenopathy:    She has no cervical adenopathy.  Neurological: She is alert and oriented to person, place, and time.  Skin: Skin is warm and dry. She is not diaphoretic.  Poor skin turgor.  Nursing note and vitals reviewed.   ED Course  Procedures (including critical care time) Labs Review Labs Reviewed  CBC - Abnormal; Notable for the following:    Platelets 461 (*)    All other components within normal limits  URINALYSIS, ROUTINE W REFLEX MICROSCOPIC (NOT AT [facility]) - Abnormal; Notable for the following:    APPearance HAZY (*)    Hgb urine dipstick SMALL (*)    Bilirubin  Urine MODERATE (*)    Ketones, ur 15 (*)    Protein, ur 100 (*)    Leukocytes, UA SMALL (*)    All other components within normal limits  COMPREHENSIVE METABOLIC PANEL - Abnormal; Notable for the following:    Potassium 7.1 (*)    CO2 9 (*)    BUN 60 (*)    Creatinine, Ser 4.56 (*)    Total Bilirubin 1.5 (*)    GFR calc non Af Amer 8 (*)    GFR calc Af Amer 9 (*)    Anion gap 17 (*)    All other components within normal limits  POTASSIUM - Abnormal; Notable for the following:    Potassium 7.0 (*)    All other components within normal limits  URINE MICROSCOPIC-ADD ON - Abnormal; Notable for  the following:    Squamous Epithelial / LPF 6-30 (*)    Bacteria, UA FEW (*)    Casts HYALINE CASTS (*)    All other components within normal limits  C DIFFICILE QUICK SCREEN W PCR REFLEX  GASTROINTESTINAL PANEL BY PCR, STOOL (REPLACES STOOL CULTURE)  LIPASE, BLOOD  I-STAT CG4 LACTIC ACID, ED  I-STAT CG4 LACTIC ACID, ED    Imaging Review Ct Abdomen Pelvis Wo Contrast  12/08/2015  CLINICAL DATA:  Nausea and decreased appetite EXAM: CT ABDOMEN AND PELVIS WITHOUT CONTRAST TECHNIQUE: Multidetector CT imaging of the abdomen and pelvis was performed following the standard protocol without IV contrast. COMPARISON:  None. FINDINGS: Lung bases are free of acute infiltrate or sizable effusion. A large hiatal hernia is noted. The gallbladder has been surgically removed. The liver, spleen, adrenal glands and pancreas are within normal limits. The kidneys are well visualized and demonstrates some small cysts. No renal calculi or obstructive changes are noted. The bladder is well distended. Calcified uterine fibroids are seen. No pelvic mass lesion is noted. No acute bony abnormality is seen. A right hip replacement is noted. Degenerative change of the lumbar spine is noted. IMPRESSION: Chronic changes without acute abnormality. Electronically Signed   By: Inez Catalina M.D.   On: 12/08/2015 16:50   I have  personally reviewed and evaluated these images and lab results as part of my medical decision-making.   EKG Interpretation   Date/Time:  Wednesday December 08 2015 10:54:42 EST Ventricular Rate:  84 PR Interval:  184 QRS Duration: 149 QT Interval:  398 QTC Calculation: 470 R Axis:   -20 Text Interpretation:  Sinus rhythm Consider right atrial enlargement Left  bundle branch block peaked ts, LBBB Confirmed by Gerald Leitz ([id number])  on 12/08/2015 5:24:17 PM       CRITICAL CARE Performed by: Marcell Pfeifer C Doryce Mcgregory Total critical care time: 45 minutes Critical care time was exclusive of separately billable procedures and treating other patients. Critical care was necessary to treat or prevent imminent or life-threatening deterioration. Critical care was time spent personally by me on the following activities: development of treatment plan with patient and/or surrogate as well as nursing, discussions with consultants, evaluation of patient's response to treatment, examination of patient, obtaining history from patient or surrogate, ordering and performing treatments and interventions, ordering and review of laboratory studies, ordering and review of radiographic studies, pulse oximetry and re-evaluation of patient's condition.  Medications  insulin aspart (novoLOG) injection 10 Units (not administered)  calcium gluconate 1 g in sodium chloride 0.9 % 100 mL IVPB (1 g Intravenous New Bag/Given 12/08/15 1713)  dextrose 50 % solution 50 mL (not administered)  ondansetron (ZOFRAN-ODT) disintegrating tablet 4 mg (4 mg Oral Given 12/08/15 1416)  sodium chloride 0.9 % bolus 1,000 mL (0 mLs Intravenous Stopped 12/08/15 1657)  sodium chloride 0.9 % bolus 1,000 mL (1,000 mLs Intravenous New Bag/Given 12/08/15 1546)  sodium chloride 0.9 % bolus 1,000 mL (1,000 mLs Intravenous New Bag/Given 12/08/15 1713)     MDM   Final diagnoses:  Intractable vomiting with nausea, vomiting of unspecified type   Diarrhea, unspecified type  Dehydration  AKI (acute kidney injury) ([place])    [name] End presents with nausea, vomiting, and diarrhea for the last week.  Findings and plan of care discussed with Sharlett Iles, MD.  Patient's presentation is consistent with a viral illness; however, patient also shows signs of significant dehydration, especially with the bump up in her creatinine. Patient is nontoxic  appearing, not tachycardic, is afebrile, maintains an excellent oxygen saturation, and is in no apparent distress. EKG shows peaked T waves with normal rate. Also shows LBBB, patient has history of the same. 4:03 PM spoke with Dr. Charlies Silvers, hospitalist, who stated that she was not comfortable with taking the patient while the potassium was elevated to the level that it is. Requested that we get a redraw of the potassium, start interventions if necessary, and then repage her. [id number] 4:17 PM Dr. Charlies Silvers called back and states that she saw a new potassium of 7.0 and requested that we work to lower the potassium to a level of at least 6.0. Dr. Charlies Silvers called back again and stated that she would take the patient now that we've started treatment for hyperkalemia. Asked me to put in a temporary admission orders for step down admission. No additional instructions. Patient was updated on this plan of care and states that she has no problems with the admission. Patient is still in no apparent distress.  Filed Vitals:   12/08/15 1309 12/08/15 1435 12/08/15 1530 12/08/15 1545  BP:  160/76 168/88   Pulse: 74 89    Temp:  98.1 F (36.7 C)    TempSrc:  Oral    Resp: 24 28 29    SpO2: 97% 96%  96%     Lorayne Bender, PA-C 12/08/15 [place], PA-C 12/08/15 [place], MD 12/09/15 1530

## 2015-12-08 NOTE — ED Notes (Addendum)
Pt reports she has had decreased appetite, unable to eat but small bites x3 weeks, vomited on 12/25 and today 12/28. Denies pain, but reports her "stomach feels upset". Last bowel movement this morning- loose/diarrhea stool. Denies cough, fever, or SOB.   Pt went to pcp last week, was dx with GERD, no blood work drawn.

## 2015-12-08 NOTE — ED Notes (Signed)
Pt had extremely foul smell diarrhea stool.

## 2015-12-08 NOTE — H&P (Addendum)
[facility] History and Physical  [name] [id number] DOB: [birth year] DOA: 12/08/2015  Referring physician: ER PA Arlean Hopping PCP: [name], MD  Chief Complaint: poor po intake   HPI:  81 y.o. female, with a history of hypertension, LBBB, paroxysmal atrial fibrillation who presented to [facility] ED with nausea, vomiting, poor po intake over past few days prior to this admission. No abdominal pain, no fevers or chills. No chest pain, no palpitations. No shortness of breath. In ED< BP was 137/76, HR 104, RR 17, afebrile. Blood work most significant for potassum of 7 which was initially hemolyzed. Repeat value is again 7 and order placed for calcium gluconate, insulin, kayexalate, lasix. Also, Cr was 4.56 while her baseline is usually WNL. Admission to SDU.    Assessment & Plan    Active Problems: Hyperkalemia / Acute renal failure - Likely from dehydration,  GI losses and spironolactone  - Will start IV fluids - Treat hyperkalemia with insulin, calcium gluconate - Also while awaiting IV access will give kayexalate and lasix - Monitor BMP, repeat admission labs  Nausea, vomiting / Dehydration - Continue IV fluids - Use Zofran every 4 hours PRN   Essential hypertension - Continue clonidine - Hold spironolactone due to hyperkalemia   Paroxysmal atrial fibrillation - CHADS vasc score at least 3 - On aspirin and plavix - Rate controlled    DVT prophylaxis:  - SCD's bilaterally   Radiological Exams on Admission: Ct Abdomen Pelvis Wo Contrast 12/08/2015   Chronic changes without acute abnormality. Electronically Signed   By: Inez Catalina M.D.   On: 12/08/2015 16:50    EKG: I have personally reviewed EKG. EKG shows sinu srhythm with few PVC's   Code Status: Full Family Communication: Plan of care discussed with the patient  Disposition Plan: Admit for further evaluation, SDU  Leisa Lenz, MD  [facility] Pager [phone]  Time spent in minutes: 75  minutes  Review of Systems:  Constitutional: Negative for fever, chills and positive for malaise/fatigue. Negative for diaphoresis.  HENT: Negative for hearing loss, ear pain, nosebleeds, congestion, sore throat, neck pain, tinnitus and ear discharge.   Eyes: Negative for blurred vision, double vision, photophobia, pain, discharge and redness.  Respiratory: Negative for cough, hemoptysis, sputum production, shortness of breath, wheezing and stridor.   Cardiovascular: Negative for chest pain, palpitations, orthopnea, claudication and leg swelling.  Gastrointestinal: per HPI Genitourinary: Negative for dysuria, urgency, frequency, hematuria and flank pain.  Musculoskeletal: Negative for myalgias, back pain, joint pain and falls.  Skin: Negative for itching and rash.  Neurological: Negative for dizziness and weakness. Negative for tingling, tremors, sensory change, speech change, focal weakness, loss of consciousness and headaches.  Endo/Heme/Allergies: Negative for environmental allergies and polydipsia. Does not bruise/bleed easily.  Psychiatric/Behavioral: Negative for suicidal ideas. The patient is not nervous/anxious.      Past Medical History  Diagnosis Date  . Hypertension   . Internal hemorrhoids   . Diverticulosis   . Seizure disorder ([place])   . Arthritis   . Left bundle branch block   . Palpitation   . PVD (peripheral vascular disease) ([place])   . PAF (paroxysmal atrial fibrillation) ([place])   . Iron deficiency anemia   . Seizure disorder ([place])   . Personal history of subdural hematoma   . Blood transfusion without reported diagnosis 07/2013  . Coronary artery disease   . History of nocturia    Past Surgical History  Procedure Laterality Date  . Laparoscopic appendectomy    .  Knee arthroscopy    . Angioplasty  1999  . Laparoscopic cholecystectomy    . Carpal tunnel release    . Cataract extraction    . Cardiac catheterization    . Coronary angioplasty    . Colonoscopy     . Colonoscopy w/ biopsies    . Joint replacement    . Bilateral total knee replacements      Left Knee-12/29/2003, Right Knee-04/10/2002  . Right total hip athroplasty  01/23/2002  . Right shoulder arthroscopy  12/18/2006  . Major duct excision of left breast  06/01/2004  . Breast surgery    . Inguinal hernia repair  08/08/2006    RIGHT INGUINAL HERNIA REPAIR WITH MESH  . Tonsillectomy     Social History:  reports that she has quit smoking. She has never used smokeless tobacco. She reports that she does not drink alcohol or use illicit drugs.  Allergies  Allergen Reactions  . Cephalexin Other (See Comments)    REACTION: Questionable, no reaction listed  . Haldol [Haloperidol Decanoate] Other (See Comments)    unknown  . Lorazepam Other (See Comments)    unknown    Family History:  Family History  Problem Relation Age of Onset  . Hypertension Mother   . Stroke Mother   . Cancer Father   . Cancer Sister   . Other Brother     brain tumor     Prior to Admission medications   Medication Sig Start Date End Date Taking? Authorizing Provider  aspirin EC 81 MG tablet Take 81 mg by mouth daily.   Yes Historical Provider, MD  cloNIDine (CATAPRES) 0.1 MG tablet Take 0.1 mg by mouth 2 (two) times daily.   Yes Historical Provider, MD  clopidogrel (PLAVIX) 75 MG tablet Take 75 mg by mouth daily.  08/21/14  Yes Historical Provider, MD  COLCRYS 0.6 MG tablet Take 0.6 mg by mouth daily.  09/10/14  Yes Historical Provider, MD  dexlansoprazole (DEXILANT) 60 MG capsule Take 60 mg by mouth daily.   Yes Historical Provider, MD  ferrous sulfate 325 (65 FE) MG tablet Take 325 mg by mouth daily with breakfast.   Yes Historical Provider, MD  Iron-FA-B Cmp-C-Biot-Probiotic (FUSION PLUS) CAPS Take 1 tablet by mouth daily.  03/01/15  Yes Historical Provider, MD  levETIRAcetam (KEPPRA) 500 MG tablet Take 500 mg by mouth 2 (two) times daily.   Yes Historical Provider, MD  spironolactone (ALDACTONE) 25 MG tablet  Take 1 tablet (25 mg total) by mouth daily. 08/25/15  Yes Thayer Headings, MD  TRIBENZOR 40-10-12.5 MG TABS Take 1 tablet by mouth daily. 10/06/15  Yes Historical Provider, MD   Physical Exam: Filed Vitals:   12/08/15 1309 12/08/15 1435 12/08/15 1530 12/08/15 1545  BP:  160/76 168/88   Pulse: 74 89    Temp:  98.1 F (36.7 C)    TempSrc:  Oral    Resp: 24 28 29    SpO2: 97% 96%  96%    Physical Exam  Constitutional: Appears ill. No distress.  HENT: Normocephalic. No tonsillar erythema or exudates Eyes: Conjunctivae are normal. No scleral icterus.  Neck: Normal ROM. Neck supple. No JVD. No tracheal deviation. No thyromegaly.  CVS: RRR, S1/S2 +, no murmurs, no gallops, no carotid bruit.  Pulmonary: Effort and breath sounds normal, no stridor, rhonchi, wheezes, rales.  Abdominal: Soft. BS +,  no distension, tenderness, rebound or guarding.  Musculoskeletal: Normal range of motion. No edema and no tenderness.  Lymphadenopathy: No lymphadenopathy  noted, cervical, inguinal. Neuro: Alert. Normal reflexes, muscle tone coordination. No focal neurologic deficits. Skin: Skin is warm and dry. No rash noted.  No erythema. No pallor.  Psychiatric: Normal mood and affect. Behavior, judgment, thought content normal.   Labs on Admission:  Basic Metabolic Panel:  Recent Labs Lab 12/08/15 1426 12/08/15 1539  NA 136  --   K 7.1* 7.0*  CL 110  --   CO2 9*  --   GLUCOSE 95  --   BUN 60*  --   CREATININE 4.56*  --   CALCIUM 9.9  --    Liver Function Tests:  Recent Labs Lab 12/08/15 1426  AST 36  ALT 19  ALKPHOS 111  BILITOT 1.5*  PROT 7.6  ALBUMIN 4.7    Recent Labs Lab 12/08/15 1426  LIPASE 34   No results for input(s): AMMONIA in the last 168 hours. CBC:  Recent Labs Lab 12/08/15 1242  WBC 10.1  HGB 12.6  HCT 38.0  MCV 87.6  PLT 461*   Cardiac Enzymes: No results for input(s): CKTOTAL, CKMB, CKMBINDEX, TROPONINI in the last 168 hours. BNP: Invalid input(s):  POCBNP CBG: No results for input(s): GLUCAP in the last 168 hours.  If 7PM-7AM, please contact night-coverage [email or web address] Password [email or web address] 12/08/2015, 5:04 PM

## 2015-12-08 NOTE — ED Notes (Signed)
Pt unable to void at this time, will receive more fluids and reassess in 30 min

## 2015-12-08 NOTE — ED Notes (Signed)
She c/o unrelenting nausea and anorexia x ~3 months.  Her pcp has dx with GERD; however, the meds prescribed are "not helping".  She is in no distress.

## 2015-12-08 NOTE — ED Notes (Signed)
Pt back from CT

## 2015-12-08 NOTE — ED Notes (Signed)
Pt to CT

## 2015-12-08 NOTE — ED Notes (Signed)
rn sent message to hospitalist, explaining pt had limited IV access and unable to give dextrose though current IVs.

## 2015-12-08 NOTE — ED Notes (Addendum)
Unable to given dextrose at this time, through IV access. Will hold insulin as well.  md little aware.

## 2015-12-08 NOTE — ED Notes (Signed)
At this time unable to get enough blood to run i stat lactic. Provider made aware

## 2015-12-08 NOTE — ED Notes (Signed)
rn explained to ICU nurses about IV access. rn attempted to [place] to get IV, unsuccessful. rn did not see any acceptable veins via [place]. rn got ordered to stick feet. rn able to get 1 IV in right foot 24 g, potassium came back elevated. rn able to get 2nd IV in left foot. Both IVs required 3 attempts. Potassium came back 7.0, PA Joy and Md Little made aware. rn explained to both providers pts IV access and that pt needed more access. md Little unable to get EJ, Little stated that she was not going to put a central line in because pt had IV access already. rn explained that rn could not give dextrose through 24 g IV. Little acknowledged and told rn to just give calcium, and that on the floor they could put a picc line in if they wanted. IV team consulted. Per pt IV looked and said she would have to come back.

## 2015-12-09 ENCOUNTER — Encounter (HOSPITAL_COMMUNITY): Payer: Self-pay | Admitting: Internal Medicine

## 2015-12-09 DIAGNOSIS — R7989 Other specified abnormal findings of blood chemistry: Secondary | ICD-10-CM

## 2015-12-09 DIAGNOSIS — E875 Hyperkalemia: Secondary | ICD-10-CM | POA: Diagnosis present

## 2015-12-09 DIAGNOSIS — D509 Iron deficiency anemia, unspecified: Secondary | ICD-10-CM | POA: Diagnosis present

## 2015-12-09 DIAGNOSIS — E870 Hyperosmolality and hypernatremia: Secondary | ICD-10-CM

## 2015-12-09 DIAGNOSIS — R778 Other specified abnormalities of plasma proteins: Secondary | ICD-10-CM | POA: Diagnosis present

## 2015-12-09 DIAGNOSIS — R1111 Vomiting without nausea: Secondary | ICD-10-CM

## 2015-12-09 DIAGNOSIS — R569 Unspecified convulsions: Secondary | ICD-10-CM

## 2015-12-09 DIAGNOSIS — I2510 Atherosclerotic heart disease of native coronary artery without angina pectoris: Secondary | ICD-10-CM

## 2015-12-09 DIAGNOSIS — N179 Acute kidney failure, unspecified: Secondary | ICD-10-CM | POA: Diagnosis present

## 2015-12-09 LAB — GASTROINTESTINAL PANEL BY PCR, STOOL (REPLACES STOOL CULTURE)
ADENOVIRUS F40/41: NOT DETECTED
ASTROVIRUS: NOT DETECTED
CAMPYLOBACTER SPECIES: NOT DETECTED
CRYPTOSPORIDIUM: NOT DETECTED
CYCLOSPORA CAYETANENSIS: NOT DETECTED
E. coli O157: NOT DETECTED
ENTEROAGGREGATIVE E COLI (EAEC): NOT DETECTED
ENTEROPATHOGENIC E COLI (EPEC): NOT DETECTED
Entamoeba histolytica: NOT DETECTED
Enterotoxigenic E coli (ETEC): NOT DETECTED
GIARDIA LAMBLIA: NOT DETECTED
Norovirus GI/GII: NOT DETECTED
Plesimonas shigelloides: NOT DETECTED
Rotavirus A: NOT DETECTED
Salmonella species: NOT DETECTED
Sapovirus (I, II, IV, and V): NOT DETECTED
Shiga like toxin producing E coli (STEC): NOT DETECTED
Shigella/Enteroinvasive E coli (EIEC): NOT DETECTED
VIBRIO SPECIES: NOT DETECTED
Vibrio cholerae: NOT DETECTED
YERSINIA ENTEROCOLITICA: NOT DETECTED

## 2015-12-09 LAB — POTASSIUM: Potassium: 5.3 mmol/L — ABNORMAL HIGH (ref 3.5–5.1)

## 2015-12-09 LAB — BASIC METABOLIC PANEL
ANION GAP: 13 (ref 5–15)
BUN: 49 mg/dL — ABNORMAL HIGH (ref 6–20)
CHLORIDE: 123 mmol/L — AB (ref 101–111)
CO2: 11 mmol/L — AB (ref 22–32)
Calcium: 9.6 mg/dL (ref 8.9–10.3)
Creatinine, Ser: 3.41 mg/dL — ABNORMAL HIGH (ref 0.44–1.00)
GFR calc Af Amer: 13 mL/min — ABNORMAL LOW (ref >60)
GFR, EST NON AFRICAN AMERICAN: 11 mL/min — AB (ref >60)
GLUCOSE: 97 mg/dL (ref 65–99)
POTASSIUM: 4.6 mmol/L (ref 3.5–5.1)
Sodium: 147 mmol/L — ABNORMAL HIGH (ref 135–145)

## 2015-12-09 LAB — TROPONIN I
TROPONIN I: 0.15 ng/mL — AB (ref <0.031)
Troponin I: 0.14 ng/mL — ABNORMAL HIGH (ref <0.031)

## 2015-12-09 LAB — CBC
HEMATOCRIT: 30.3 % — AB (ref 36.0–46.0)
HEMOGLOBIN: 10.2 g/dL — AB (ref 12.0–15.0)
MCH: 28.7 pg (ref 26.0–34.0)
MCHC: 33.7 g/dL (ref 30.0–36.0)
MCV: 85.4 fL (ref 78.0–100.0)
Platelets: 263 10*3/uL (ref 150–400)
RBC: 3.55 MIL/uL — ABNORMAL LOW (ref 3.87–5.11)
RDW: 13.8 % (ref 11.5–15.5)
WBC: 10 10*3/uL (ref 4.0–10.5)

## 2015-12-09 LAB — GLUCOSE, CAPILLARY: GLUCOSE-CAPILLARY: 85 mg/dL (ref 65–99)

## 2015-12-09 MED ORDER — AMLODIPINE BESYLATE 10 MG PO TABS
10.0000 mg | ORAL_TABLET | Freq: Every day | ORAL | Status: DC
  Administered 2015-12-09 – 2015-12-14 (×6): 10 mg via ORAL
  Filled 2015-12-09 (×7): qty 1

## 2015-12-09 MED ORDER — CLONIDINE HCL 0.1 MG PO TABS
0.2000 mg | ORAL_TABLET | Freq: Two times a day (BID) | ORAL | Status: DC
  Administered 2015-12-09 – 2015-12-14 (×10): 0.2 mg via ORAL
  Filled 2015-12-09 (×11): qty 2

## 2015-12-09 MED ORDER — HYDRALAZINE HCL 20 MG/ML IJ SOLN
10.0000 mg | Freq: Four times a day (QID) | INTRAMUSCULAR | Status: DC | PRN
  Administered 2015-12-09: 10 mg via INTRAVENOUS
  Filled 2015-12-09 (×3): qty 1

## 2015-12-09 MED ORDER — HYDRALAZINE HCL 25 MG PO TABS
25.0000 mg | ORAL_TABLET | Freq: Four times a day (QID) | ORAL | Status: DC
  Administered 2015-12-09 – 2015-12-14 (×19): 25 mg via ORAL
  Filled 2015-12-09 (×18): qty 1

## 2015-12-09 MED ORDER — HYDRALAZINE HCL 25 MG PO TABS
25.0000 mg | ORAL_TABLET | Freq: Three times a day (TID) | ORAL | Status: DC
  Administered 2015-12-09 (×2): 25 mg via ORAL
  Filled 2015-12-09 (×2): qty 1

## 2015-12-09 NOTE — Progress Notes (Signed)
Peripherally Inserted Central Catheter/Midline Placement  The IV Nurse has discussed with the patient and/or persons authorized to consent for the patient, the purpose of this procedure and the potential benefits and risks involved with this procedure.  The benefits include less needle sticks, lab draws from the catheter and patient may be discharged home with the catheter.  Risks include, but not limited to, infection, bleeding, blood clot (thrombus formation), and puncture of an artery; nerve damage and irregular heat beat.  Alternatives to this procedure were also discussed.  PICC/Midline Placement Documentation        [name] 12/09/2015, 3:27 PM

## 2015-12-09 NOTE — Progress Notes (Addendum)
Patient ID: [name], female   DOB: [birth year], 81 y.o.   MRN: [id number] [facility] PROGRESS NOTE  [name] [id number] DOB: 01/11/1930 DOA: 12/08/2015 PCP: [name], MD  Brief narrative:    81 y.o. female, with a history of hypertension, LBBB, paroxysmal atrial fibrillation who presented to [facility] ED with nausea, vomiting, poor po intake over past few days prior to this admission. No abdominal pain, no fevers. In ED, BP was 137/76, HR 104, RR 17, afebrile. Blood work most significant for potassum of 7 which was initially hemolyzed. Repeat value is again 7 and order placed for calcium gluconate, insulin, kayexalate, lasix. Also, Cr was 4.56 while her baseline is WNL. Admission to SDU.   Assessment/Plan:    Active Problems: Hyperkalemia / Acute renal failure - Likely from dehydration,GI losses and spironolactone  - Because of poor IV access patient did not receive calcium gluconate in ED. She has received Kayexalate, Lasix and albuterol nebulizer which has brought her potassium down to within normal limits - 12-lead EKG on the admission showed sinus rhythm. - She will be monitored in step down unit for next 24 hours because of accelerated hypertension.  Hypernatremia - Likely secondary to dehydration, GI losses - Continue IV fluids  Nausea, vomiting / Dehydration - Likely because of acute renal failure - Continue IV fluids and use antiemetics as needed  Seizures (HCC) - Continue Keppra - Obtain physical therapy evaluation  Troponin level elevated / CAD of native artery without angina pectoris  - Likely secondary to demand ischemia from acute renal failure as well as hyperkalemia - No reports of chest pain - 12-lead EKG on the admission showed sinus rhythm - Continue aspirin and Plavix - Palliative consulted for goals of care  Anemia, iron deficiency - Continue ferrous sulfate supplementation   Essential hypertension / accelerated hypertension -  Continue clonidine and added hydralazine and norvasc for better blood pressure control - Hold spironolactone due to hyperkalemia   Paroxysmal atrial fibrillation - CHADS vasc score at least 3 - On aspirin and plavix - Rate controlled   DVT Prophylaxis  - SCD's bilaterally   Code Status: Full.  Family Communication:  plan of care discussed with the patient Disposition Plan: remains in SDU due to accelerated hypertension   IV access:  Peripheral IV  Procedures and diagnostic studies:    Ct Abdomen Pelvis Wo Contrast 12/08/2015  Chronic changes without acute abnormality. Electronically Signed   By: Inez Catalina M.D.   On: 12/08/2015 16:50   Medical Consultants:  Palliative care  Other Consultants:  PT Nutrition  IAnti-Infectives:   None     Leisa Lenz, MD  [facility] Pager [phone]  Time spent in minutes: 25 minutes  If 7PM-7AM, please contact night-coverage [email or web address] Password [email or web address] 12/09/2015, 12:03 PM   LOS: 1 day    HPI/Subjective: No acute overnight events. Patient reports she feels better this am.  Objective: Filed Vitals:   12/09/15 0500 12/09/15 0735 12/09/15 0800 12/09/15 1000  BP:  172/67 192/70 189/76  Pulse:  86  90  Temp:  98.5 F (36.9 C)    TempSrc:  Oral    Resp:  22  31  Height:      Weight: 171 lb 15.3 oz (78 kg)     SpO2:  100%  100%    Intake/Output Summary (Last 24 hours) at 12/09/15 1203 Last data filed at 12/09/15 1100  Gross per 24 hour  Intake 2186.25 ml  Output   1421 ml  Net 765.25 ml    Exam:   General:  Pt is alert, follows commands appropriately, not in acute distress  Cardiovascular: Regular rate and rhythm, S1/S2 (+)  Respiratory: Clear to auscultation bilaterally, no wheezing, no crackles, no rhonchi  Abdomen: Soft, non tender, non distended, bowel sounds present  Extremities: No edema, pulses DP and PT palpable bilaterally  Neuro: Grossly nonfocal  Data Reviewed: Basic Metabolic  Panel:  Recent Labs Lab 12/08/15 0055 12/08/15 1426 12/08/15 1539 12/08/15 1908 12/09/15 0332  NA  --  136  --  141 147*  K 5.3* 7.1* 7.0* 6.1* 4.6  CL  --  110  --  117* 123*  CO2  --  9*  --  10* 11*  GLUCOSE  --  95  --  86 97  BUN  --  60*  --  52* 49*  CREATININE  --  4.56*  --  3.72* 3.41*  CALCIUM  --  9.9  --  9.4 9.6  MG  --   --   --  1.3*  --   PHOS  --   --   --  4.8*  --    Liver Function Tests:  Recent Labs Lab 12/08/15 1426  AST 36  ALT 19  ALKPHOS 111  BILITOT 1.5*  PROT 7.6  ALBUMIN 4.7    Recent Labs Lab 12/08/15 1426  LIPASE 34   No results for input(s): AMMONIA in the last 168 hours. CBC:  Recent Labs Lab 12/08/15 1242 12/08/15 1908 12/09/15 0332  WBC 10.1 11.7* 10.0  NEUTROABS  --  9.9*  --   HGB 12.6 10.8* 10.2*  HCT 38.0 32.7* 30.3*  MCV 87.6 87.0 85.4  PLT 461* 303 263   Cardiac Enzymes:  Recent Labs Lab 12/08/15 1908 12/09/15 0050 12/09/15 0657  TROPONINI 0.11* 0.14* 0.15*   BNP: Invalid input(s): POCBNP CBG:  Recent Labs Lab 12/09/15 0752  GLUCAP 85    Recent Results (from the past 240 hour(s))  C difficile quick scan w PCR reflex     Status: None   Collection Time: 12/08/15  4:28 PM  Result Value Ref Range Status   C Diff antigen NEGATIVE NEGATIVE Final   C Diff toxin NEGATIVE NEGATIVE Final   C Diff interpretation Negative for toxigenic C. difficile  Final  MRSA PCR Screening     Status: None   Collection Time: 12/08/15  6:30 PM  Result Value Ref Range Status   MRSA by PCR NEGATIVE NEGATIVE Final    Comment:        The GeneXpert MRSA Assay (FDA approved for NASAL specimens only), is one component of a comprehensive MRSA colonization surveillance program. It is not intended to diagnose MRSA infection nor to guide or monitor treatment for MRSA infections.      Scheduled Meds: . aspirin EC  81 mg Oral Daily  . calcium gluconate  1 g Intravenous Once  . cloNIDine  0.1 mg Oral BID  . clopidogrel   75 mg Oral Daily  . colchicine  0.3 mg Oral Daily  . dextrose  1 ampule Intravenous Once  . ferrous sulfate  325 mg Oral Q breakfast  . hydrALAZINE  25 mg Oral 3 times per day  . insulin aspart  10 Units Intravenous Once  . levETIRAcetam  500 mg Oral BID  . multivitamins with iron  1 tablet Oral Daily  . sodium chloride  3 mL Intravenous Q12H   Continuous Infusions: . sodium chloride 75  mL/hr at 12/09/15 1100

## 2015-12-09 NOTE — Care Management Note (Signed)
Case Management Note  Patient Details  Name: [name] MRN: [id number] Date of Birth: [birth year]  Subjective/Objective:      hyperkalemia              Action/Plan:Date: December 09, 2015 Chart reviewed for concurrent status and case management needs. Will continue to follow patient for changes and needs: Velva Harman, RN, BSN, [place]   [phone]   Expected Discharge Date:   (UNKNOWN)               Expected Discharge Plan:     In-House Referral:     Discharge planning Services     Post Acute Care Choice:    Choice offered to:     DME Arranged:    DME Agency:     HH Arranged:    [place] Agency:     Status of Service:     Medicare Important Message Given:    Date Medicare IM Given:    Medicare IM give by:    Date Additional Medicare IM Given:    Additional Medicare Important Message give by:     If discussed at [place] of Stay Meetings, dates discussed:    Additional Comments:  Leeroy Cha, RN 12/09/2015, 11:23 AM

## 2015-12-09 NOTE — Progress Notes (Signed)
Assessed both arms for PICC insertion.   Did not see anything to attempt.   I even assessed both forearms to see if I could at least get her a PIV so could get the foot IVs out.   Nothing in forearms either.   Primary RN made aware.  She contacted MD.   PIV in each foot WNL.

## 2015-12-10 DIAGNOSIS — R531 Weakness: Secondary | ICD-10-CM

## 2015-12-10 DIAGNOSIS — Z515 Encounter for palliative care: Secondary | ICD-10-CM | POA: Insufficient documentation

## 2015-12-10 DIAGNOSIS — Z66 Do not resuscitate: Secondary | ICD-10-CM | POA: Insufficient documentation

## 2015-12-10 DIAGNOSIS — E876 Hypokalemia: Secondary | ICD-10-CM

## 2015-12-10 DIAGNOSIS — N179 Acute kidney failure, unspecified: Secondary | ICD-10-CM | POA: Insufficient documentation

## 2015-12-10 LAB — BASIC METABOLIC PANEL
Anion gap: 11 (ref 5–15)
BUN: 33 mg/dL — AB (ref 6–20)
CHLORIDE: 118 mmol/L — AB (ref 101–111)
CO2: 15 mmol/L — AB (ref 22–32)
CREATININE: 2.71 mg/dL — AB (ref 0.44–1.00)
Calcium: 8.9 mg/dL (ref 8.9–10.3)
GFR calc Af Amer: 18 mL/min — ABNORMAL LOW (ref >60)
GFR calc non Af Amer: 15 mL/min — ABNORMAL LOW (ref >60)
GLUCOSE: 82 mg/dL (ref 65–99)
Potassium: 3.4 mmol/L — ABNORMAL LOW (ref 3.5–5.1)
Sodium: 144 mmol/L (ref 135–145)

## 2015-12-10 LAB — GLUCOSE, CAPILLARY: Glucose-Capillary: 78 mg/dL (ref 65–99)

## 2015-12-10 MED ORDER — ENSURE ENLIVE PO LIQD
237.0000 mL | Freq: Two times a day (BID) | ORAL | Status: DC
  Administered 2015-12-10 – 2015-12-14 (×6): 237 mL via ORAL

## 2015-12-10 MED ORDER — POTASSIUM CHLORIDE CRYS ER 20 MEQ PO TBCR
20.0000 meq | EXTENDED_RELEASE_TABLET | Freq: Once | ORAL | Status: AC
  Administered 2015-12-10: 20 meq via ORAL
  Filled 2015-12-10: qty 1

## 2015-12-10 NOTE — Progress Notes (Signed)
Assumed care of pt at 1500. RN Tilda Franco and RN Curt Bears completed bedside reporting and introduced self to pt. Pt is in no apparent distress and resting comfortably. Pt has no needs at this time. Room checked to ensure that environment is safe in order to prevent falls. Bed in lowest and locked position. Pt notified that hourly rounding will continue and that she should call for any needs between RN and PCT rounds. Will continue to monitor.

## 2015-12-10 NOTE — Consult Note (Signed)
Consultation Note Date: 12/10/2015   Patient Name: [name]  DOB: [birth year]  MRN: [id number]  Age / Sex: 81 y.o., female  PCP: Lucianne Lei, MD Referring Physician: Robbie Lis, MD  Reason for Consultation: Establishing goals of care    Clinical Assessment/Narrative:  81 y.o. female, with a history of hypertension, LBBB, paroxysmal atrial fibrillation who presented to [facility] ED with nausea, vomiting, poor po intake over past few days prior to this admission. No abdominal pain, no fevers or chills. No chest pain, no palpitations. No shortness of breath.  She reports 3-4 weeks of nausea  In ED< BP was 137/76, HR 104, RR 17, afebrile. Blood work most significant for potassum of 7 which was initially hemolyzed. Repeat value is again 7 and order placed for calcium gluconate, insulin, kayexalate, lasix. Also, Cr was 4.56 while her baseline is usually WNL. Admission to SDU.  Patient improving over last 24 hrs  This NP Wadie Lessen reviewed medical records, received report from team, assessed the patient and then meet at the patient's bedside along with her daughter/[name]  to discuss diagnosis ( reviewed labs and scan) prognosis, GOC,advanced directives and  disposition and options.  A  discussion was had today regarding advanced directives.  Concepts specific to code status, artifical feeding and hydration was had.  Values and goals of care important to patient and family were attempted to be elicited.  Concept of  Palliative Care was discussed  Questions and concerns addressed.  Family encouraged to call with questions or concerns.  PMT will continue to support holistically.    Primary Decision Maker: patient herself with support of her family    HCPOA: none, hopeful to document this hospital stay, spiritual care consulted     Code Status/Advance Care Planning: DNR-documented today    Code Status Orders         Start     Ordered   12/08/15 1825  Full code   Continuous     12/08/15 1824      Other Directives:None    Psycho-social/Spiritual:  Support System: Strong Desire for further Chaplaincy support:yes- contacted for assistance with AD and HPOA   Discharge Planning: Home with [facility] for in home rehabilitation if eligible   Chief Complaint/ Primary Diagnoses: Present on Admission:  . Vomiting . Essential hypertension . ARF (acute renal failure) ([place]) . Hyperkalemia . Hypernatremia . Troponin level elevated . Anemia, iron deficiency . Coronary artery disease involving native coronary artery of native heart without angina pectoris  I have reviewed the medical record, interviewed the patient and family, and examined the patient. The following aspects are pertinent.  History reviewed. No pertinent past medical history. Social History   Social History  . Marital Status: Widowed    Spouse Name: N/A  . Number of Children: N/A  . Years of Education: N/A   Social History Main Topics  . Smoking status: Former Research scientist (life sciences)  . Smokeless tobacco: Never Used  . Alcohol Use: No  . Drug Use: No  . Sexual Activity: No   Other Topics Concern  . None   Social History Narrative   Family History  Problem Relation Age of Onset  . Hypertension Mother   . Stroke Mother   . Cancer Father   . Cancer Sister   . Other Brother     brain tumor   Scheduled Meds: . amLODipine  10 mg Oral Daily  . aspirin EC  81 mg Oral Daily  . calcium gluconate  1  g Intravenous Once  . cloNIDine  0.2 mg Oral BID  . clopidogrel  75 mg Oral Daily  . colchicine  0.3 mg Oral Daily  . dextrose  1 ampule Intravenous Once  . ferrous sulfate  325 mg Oral Q breakfast  . hydrALAZINE  25 mg Oral 4 times per day  . insulin aspart  10 Units Intravenous Once  . levETIRAcetam  500 mg Oral BID  . multivitamins with iron  1 tablet Oral Daily  . sodium chloride  3 mL Intravenous Q12H   Continuous  Infusions: . sodium chloride 1,000 mL (12/10/15 0613)   PRN Meds:.acetaminophen **OR** acetaminophen, hydrALAZINE, ondansetron **OR** ondansetron (ZOFRAN) IV Medications Prior to Admission:  Prior to Admission medications   Medication Sig Start Date End Date Taking? Authorizing Provider  aspirin EC 81 MG tablet Take 81 mg by mouth daily.   Yes Historical Provider, MD  cloNIDine (CATAPRES) 0.1 MG tablet Take 0.1 mg by mouth 2 (two) times daily.   Yes Historical Provider, MD  clopidogrel (PLAVIX) 75 MG tablet Take 75 mg by mouth daily.  08/21/14  Yes Historical Provider, MD  COLCRYS 0.6 MG tablet Take 0.6 mg by mouth daily.  09/10/14  Yes Historical Provider, MD  dexlansoprazole (DEXILANT) 60 MG capsule Take 60 mg by mouth daily.   Yes Historical Provider, MD  ferrous sulfate 325 (65 FE) MG tablet Take 325 mg by mouth daily with breakfast.   Yes Historical Provider, MD  Iron-FA-B Cmp-C-Biot-Probiotic (FUSION PLUS) CAPS Take 1 tablet by mouth daily.  03/01/15  Yes Historical Provider, MD  levETIRAcetam (KEPPRA) 500 MG tablet Take 500 mg by mouth 2 (two) times daily.   Yes Historical Provider, MD  spironolactone (ALDACTONE) 25 MG tablet Take 1 tablet (25 mg total) by mouth daily. 08/25/15  Yes Thayer Headings, MD  TRIBENZOR 40-10-12.5 MG TABS Take 1 tablet by mouth daily. 10/06/15  Yes Historical Provider, MD  clindamycin (CLEOCIN) 300 MG capsule Take 1 capsule (300 mg total) by mouth 3 (three) times daily. Patient not taking: Reported on 12/08/2015 08/04/15   Janne Napoleon, NP  Olmesartan-Amlodipine-HCTZ [facility]) 40-5-25 MG TABS Take 1 tablet by mouth daily. Patient not taking: Reported on 12/08/2015 09/15/14   Thayer Headings, MD  traMADol-acetaminophen (ULTRACET) 37.5-325 MG per tablet Take 1 tablet by mouth every 6 (six) hours as needed. Patient not taking: Reported on 12/08/2015 08/04/15   Janne Napoleon, NP   Allergies  Allergen Reactions  . Cephalexin Other (See Comments)    REACTION:  Questionable, no reaction listed  . Haldol [Haloperidol Decanoate] Other (See Comments)    unknown  . Lorazepam Other (See Comments)    unknown    Review of Systems  Constitutional: Positive for activity change, appetite change and fatigue.    Physical Exam  Constitutional: She is oriented to person, place, and time. She appears well-developed and well-nourished.  HENT:  Head: Normocephalic and atraumatic.  Cardiovascular: Normal rate, regular rhythm and normal heart sounds.   Respiratory: Breath sounds normal.  GI: Soft. There is no tenderness.  Neurological: She is alert and oriented to person, place, and time.  Skin: Skin is warm and dry.    Vital Signs: BP 142/62 mmHg  Pulse 72  Temp(Src) 98.2 F (36.8 C) (Oral)  Resp 20  Ht 4\' 9"  (1.448 m)  Wt 78.2 kg (172 lb 6.4 oz)  BMI 37.30 kg/m2  SpO2 99%  SpO2: SpO2: 99 % O2 Device:SpO2: 99 % O2 Flow Rate: .  IO: Intake/output summary:  Intake/Output Summary (Last 24 hours) at 12/10/15 1203 Last data filed at 12/10/15 0900  Gross per 24 hour  Intake   1695 ml  Output    380 ml  Net   1315 ml    LBM: Last BM Date: 12/09/15 Baseline Weight: Weight: 77.5 kg (170 lb 13.7 oz) Most recent weight: Weight: 78.2 kg (172 lb 6.4 oz)      Palliative Assessment/Data:    Additional Data Reviewed:  CBC:    Component Value Date/Time   WBC 10.0 12/09/2015 0332   HGB 10.2* 12/09/2015 0332   HCT 30.3* 12/09/2015 0332   PLT 263 12/09/2015 0332   MCV 85.4 12/09/2015 0332   NEUTROABS 9.9* 12/08/2015 1908   LYMPHSABS 1.2 12/08/2015 1908   MONOABS 0.6 12/08/2015 1908   EOSABS 0.0 12/08/2015 1908   BASOSABS 0.0 12/08/2015 1908   Comprehensive Metabolic Panel:    Component Value Date/Time   NA 144 12/10/2015 0815   K 3.4* 12/10/2015 0815   CL 118* 12/10/2015 0815   CO2 15* 12/10/2015 0815   BUN 33* 12/10/2015 0815   CREATININE 2.71* 12/10/2015 0815   GLUCOSE 82 12/10/2015 0815   CALCIUM 8.9 12/10/2015 0815   AST 36  12/08/2015 1426   ALT 19 12/08/2015 1426   ALKPHOS 111 12/08/2015 1426   BILITOT 1.5* 12/08/2015 1426   PROT 7.6 12/08/2015 1426   ALBUMIN 4.7 12/08/2015 1426     Time In: 1330 Time Out: 1445 Time Total: 75 min Greater than 50%  of this time was spent counseling and coordinating care related to the above assessment and plan.  Signed by: Wadie Lessen, NP  Knox Royalty, NP  12/10/2015, 12:03 PM  Please contact Palliative Medicine Team phone at [phone] for questions and concerns.

## 2015-12-10 NOTE — Evaluation (Signed)
Physical Therapy Evaluation Patient Details Name: [name] MRN: [id number] DOB: 03-Sep-1930 Today's Date: 12/10/2015   History of Present Illness  81 y.o. female, with a history of hypertension, LBBB, paroxysmal atrial fibrillation and admitted for hyperkalemia and acute kidney failure  Clinical Impression  Pt admitted with above diagnosis. Pt currently with functional limitations due to the deficits listed below (see PT Problem List).  Pt will benefit from skilled PT to increase their independence and safety with mobility to allow discharge to the venue listed below.   Pt's mobility limited today by bil feet IV sites causing stabbing pain, so pt only able to tolerate OOB to recliner.  Pt does endorse weakness however and agreeable to HHPT upon d/c.  Also recommended pt use RW upon d/c for safety.     Follow Up Recommendations Home health PT;Supervision for mobility/OOB    Equipment Recommendations  None recommended by PT    Recommendations for Other Services       Precautions / Restrictions Precautions Precautions: Fall Restrictions Other Position/Activity Restrictions: PIV in bilateral feet      Mobility  Bed Mobility Overal bed mobility: Needs Assistance Bed Mobility: Supine to Sit     Supine to sit: Supervision;HOB elevated     General bed mobility comments: supervision for lines  Transfers Overall transfer level: Needs assistance Equipment used: 1 person hand held assist Transfers: Sit to/from Stand;Stand Pivot Transfers Sit to Stand: Min assist Stand pivot transfers: Min assist       General transfer comment: assist to rise and steady, pt reports increased pain in feet at IV sites limiting ability so only took a few small steps over to recliner, pt does required UE support however reluctant to use RW today so provided HHA and then verbal cues for hand placement for better self assist  Ambulation/Gait                Stairs             Wheelchair Mobility    Modified Rankin (Stroke Patients Only)       Balance Overall balance assessment: Needs assistance (denies hx of falls)         Standing balance support: During functional activity;Single extremity supported Standing balance-Leahy Scale: Poor Standing balance comment: requires UE support                             Pertinent Vitals/Pain Pain Assessment: 0-10 Pain Score: 7  Pain Location: IV site in feet Pain Descriptors / Indicators: Sharp;Stabbing (with standing ) Pain Intervention(s): Repositioned    Home Living Family/patient expects to be discharged to:: Private residence Living Arrangements: Children Available Help at Discharge: Family Type of Home: House       Home Layout: One level Home Equipment: Environmental consultant - 2 wheels      Prior Function Level of Independence: Independent         Comments: reports typically not using assistive device however does have RW if needed     Hand Dominance        Extremity/Trunk Assessment               Lower Extremity Assessment: Generalized weakness         Communication   Communication: No difficulties  Cognition Arousal/Alertness: Awake/alert Behavior During Therapy: WFL for tasks assessed/performed Overall Cognitive Status: Within Functional Limits for tasks assessed  General Comments      Exercises        Assessment/Plan    PT Assessment Patient needs continued PT services  PT Diagnosis Difficulty walking;Generalized weakness   PT Problem List Decreased strength;Decreased activity tolerance;Decreased mobility;Decreased balance;Decreased knowledge of use of DME  PT Treatment Interventions DME instruction;Gait training;Functional mobility training;Patient/family education;Therapeutic activities;Therapeutic exercise   PT Goals (Current goals can be found in the Care Plan section) Acute Rehab PT Goals PT Goal Formulation: With  patient Time For Goal Achievement: 12/17/15 Potential to Achieve Goals: Good    Frequency Min 3X/week   Barriers to discharge        Co-evaluation               End of Session Equipment Utilized During Treatment: Gait belt Activity Tolerance: Patient limited by pain (IV sites in feet limiting activity) Patient left: in chair;with call bell/phone within reach;with chair alarm set;with nursing/sitter in room;with family/visitor present Nurse Communication: Mobility status         Time: 4098-1191 PT Time Calculation (min) (ACUTE ONLY): 15 min   Charges:   PT Evaluation $Initial PT Evaluation Tier I: 1 Procedure     PT G Codes:        Uziah Sorter,KATHrine E 12/10/2015, 3:57 PM Zenovia Jarred, PT, DPT 12/10/2015 Pager: [phone]

## 2015-12-10 NOTE — Progress Notes (Addendum)
Patient ID: [name], female   DOB: 10/09/1930, 81 y.o.   MRN: [id number] [facility] PROGRESS NOTE  [name] [id number] DOB: Jul 27, 1930 DOA: 12/08/2015 PCP: [name], MD  Brief narrative:    81 y.o. female, with a history of hypertension, LBBB, paroxysmal atrial fibrillation who presented to [facility] ED with nausea, vomiting, poor po intake over past few days prior to this admission. No abdominal pain, no fevers.  In ED, BP was 137/76, HR 104, RR 17, afebrile. Blood work most significant for potassum of 7 which was initially hemolyzed. Repeat value is again 7 and order placed for calcium gluconate, insulin, kayexalate, lasix. Also, Cr was 4.56 and her baseline is WNL.   Pt was admitted to SDU and transferred to telemetry today 12/10/2015.  Assessment/Plan:    Active Problems: Hyperkalemia / Acute renal failure / Hypokalemia  - Likely from dehydration,GI losses and spironolactone  - Please note that because of poor IV access patient did not receive calcium gluconate in ED. She has received Kayexalate, Lasix and albuterol nebulizer which has brought her potassium down to within normal limits - 12-lead EKG on the admission showed sinus rhythm. - This am potassium slightly low at 3.4 so we gave only 20 meq potassium supplementation   Hypernatremia - Likely secondary to dehydration, GI losses - Improved with IV fluids  Nausea, vomiting / Dehydration - Likely because of acute renal failure - Continue IV fluids until PO intake improves   Seizures (HCC) - Continue Keppra - No reports of seizure in hospital  - PT evaluation in progress   Troponin level elevated / CAD of native artery without angina pectoris  - Likely secondary to demand ischemia from acute renal failure as well as hyperkalemia - 12-lead EKG on the admission showed sinus rhythm - No chest pain  - Continue aspirin and Plavix - Palliative consulted for goals of care, appreciate their input    Anemia, iron deficiency - Continue ferrous sulfate supplementation  - Hemoglobin stable at 10.2  Essential hypertension / accelerated hypertension - Continue clonidine, hydralazine and norvasc for BP control - Held spironolactone due to hyperkalemia   Paroxysmal atrial fibrillation - CHADS vasc score at least 3 - On aspirin and plavix - Rate controlled without beta blockers   DVT Prophylaxis  - SCD's bilaterally   Code Status: Full.  Family Communication:  plan of care discussed with the patient Disposition Plan: transfer to medical floor; possible D/C 12/31 if evaluated by PT  IV access:  Peripheral IV  Procedures and diagnostic studies:    Ct Abdomen Pelvis Wo Contrast 12/08/2015  Chronic changes without acute abnormality. Electronically Signed   By: Inez Catalina M.D.   On: 12/08/2015 16:50   Medical Consultants:  Palliative care  Other Consultants:  PT Nutrition  IAnti-Infectives:   None     Leisa Lenz, MD  [facility] Pager [phone]  Time spent in minutes: 25 minutes  If 7PM-7AM, please contact night-coverage [email or web address] Password [facility] 12/10/2015, 7:33 AM   LOS: 2 days    HPI/Subjective: No acute overnight events. Patient reports she feels better.  Objective: Filed Vitals:   12/10/15 0500 12/10/15 0600 12/10/15 0609 12/10/15 0700  BP:  115/47 115/47   Pulse: 68 66  66  Temp: 98.6 F (37 C) 98.6 F (37 C)  98.4 F (36.9 C)  TempSrc:      Resp: 29 22  28   Height:      Weight: 172 lb 6.4 oz (78.2 kg)  SpO2: 99% 97%  100%    Intake/Output Summary (Last 24 hours) at 12/10/15 0733 Last data filed at 12/10/15 0600  Gross per 24 hour  Intake   1725 ml  Output    580 ml  Net   1145 ml    Exam:   General:  Pt is alert, not in acute distress  Cardiovascular: RRR, S1/S2 (+)  Respiratory: No wheezing, no crackles, no rhonchi  Abdomen: (+) BS, non tender   Extremities: No leg swelling, palpable pulses   Neuro:  Nonfocal  Data Reviewed: Basic Metabolic Panel:  Recent Labs Lab 12/08/15 0055 12/08/15 1426 12/08/15 1539 12/08/15 1908 12/09/15 0332  NA  --  136  --  141 147*  K 5.3* 7.1* 7.0* 6.1* 4.6  CL  --  110  --  117* 123*  CO2  --  9*  --  10* 11*  GLUCOSE  --  95  --  86 97  BUN  --  60*  --  52* 49*  CREATININE  --  4.56*  --  3.72* 3.41*  CALCIUM  --  9.9  --  9.4 9.6  MG  --   --   --  1.3*  --   PHOS  --   --   --  4.8*  --    Liver Function Tests:  Recent Labs Lab 12/08/15 1426  AST 36  ALT 19  ALKPHOS 111  BILITOT 1.5*  PROT 7.6  ALBUMIN 4.7    Recent Labs Lab 12/08/15 1426  LIPASE 34   No results for input(s): AMMONIA in the last 168 hours. CBC:  Recent Labs Lab 12/08/15 1242 12/08/15 1908 12/09/15 0332  WBC 10.1 11.7* 10.0  NEUTROABS  --  9.9*  --   HGB 12.6 10.8* 10.2*  HCT 38.0 32.7* 30.3*  MCV 87.6 87.0 85.4  PLT 461* 303 263   Cardiac Enzymes:  Recent Labs Lab 12/08/15 1908 12/09/15 0050 12/09/15 0657  TROPONINI 0.11* 0.14* 0.15*   BNP: Invalid input(s): POCBNP CBG:  Recent Labs Lab 12/09/15 0752  GLUCAP 85    Recent Results (from the past 240 hour(s))  C difficile quick scan w PCR reflex     Status: None   Collection Time: 12/08/15  4:28 PM  Result Value Ref Range Status   C Diff antigen NEGATIVE NEGATIVE Final   C Diff toxin NEGATIVE NEGATIVE Final   C Diff interpretation Negative for toxigenic C. difficile  Final  Gastrointestinal Panel by PCR , Stool     Status: None   Collection Time: 12/08/15  4:28 PM  Result Value Ref Range Status   Campylobacter species NOT DETECTED NOT DETECTED Final   Plesimonas shigelloides NOT DETECTED NOT DETECTED Final   Salmonella species NOT DETECTED NOT DETECTED Final   Yersinia enterocolitica NOT DETECTED NOT DETECTED Final   Vibrio species NOT DETECTED NOT DETECTED Final   Vibrio cholerae NOT DETECTED NOT DETECTED Final   Enteroaggregative E coli (EAEC) NOT DETECTED NOT DETECTED  Final   Enteropathogenic E coli (EPEC) NOT DETECTED NOT DETECTED Final   Enterotoxigenic E coli (ETEC) NOT DETECTED NOT DETECTED Final   Shiga like toxin producing E coli (STEC) NOT DETECTED NOT DETECTED Final   E. coli O157 NOT DETECTED NOT DETECTED Final   Shigella/Enteroinvasive E coli (EIEC) NOT DETECTED NOT DETECTED Final   Cryptosporidium NOT DETECTED NOT DETECTED Final   Cyclospora cayetanensis NOT DETECTED NOT DETECTED Final   Entamoeba histolytica NOT DETECTED NOT DETECTED  Final   Giardia lamblia NOT DETECTED NOT DETECTED Final   Adenovirus F40/41 NOT DETECTED NOT DETECTED Final   Astrovirus NOT DETECTED NOT DETECTED Final   Norovirus GI/GII NOT DETECTED NOT DETECTED Final   Rotavirus A NOT DETECTED NOT DETECTED Final   Sapovirus (I, II, IV, and V) NOT DETECTED NOT DETECTED Final  MRSA PCR Screening     Status: None   Collection Time: 12/08/15  6:30 PM  Result Value Ref Range Status   MRSA by PCR NEGATIVE NEGATIVE Final     Scheduled Meds: . amLODipine  10 mg Oral Daily  . aspirin EC  81 mg Oral Daily  . calcium gluconate  1 g Intravenous Once  . cloNIDine  0.2 mg Oral BID  . clopidogrel  75 mg Oral Daily  . colchicine  0.3 mg Oral Daily  . dextrose  1 ampule Intravenous Once  . ferrous sulfate  325 mg Oral Q breakfast  . hydrALAZINE  25 mg Oral 4 times per day  . insulin aspart  10 Units Intravenous Once  . levETIRAcetam  500 mg Oral BID  . multivitamins with iron  1 tablet Oral Daily  . sodium chloride  3 mL Intravenous Q12H   Continuous Infusions: . sodium chloride 1,000 mL (12/10/15 [id number])

## 2015-12-10 NOTE — Progress Notes (Signed)
Initial Nutrition Assessment  DOCUMENTATION CODES:   Not applicable  INTERVENTION:  -Ensure Enlive po BID, each supplement provides 350 kcal and 20 grams of protein -RD to monitor for needs   NUTRITION DIAGNOSIS:   Inadequate oral intake related to inability to eat, nausea as evidenced by per patient/family report.  GOAL:   Patient will meet greater than or equal to 90% of their needs  MONITOR:   PO intake, Labs, I & O's, Supplement acceptance  REASON FOR ASSESSMENT:   Malnutrition Screening Tool    ASSESSMENT:   81 y.o. female, with a history of hypertension, LBBB, paroxysmal atrial fibrillation who presented to [facility] ED with nausea, vomiting, poor po intake over past few days prior to this admission. No abdominal pain, no fevers or chills. No chest pain, no palpitations. No shortness of breath In ED< BP was 137/76, HR 104, RR 17, afebrile. Blood work most significant for potassum of 7 which was initially hemolyzed. Repeat value is again 7 and order placed for calcium gluconate, insulin, kayexalate, lasix. Also, Cr was 4.56 while her baseline is usually WNL. Admission to SDU.   Spoke with pt at bedside. She reports poor PO intake related to nausea & vomiting x3 weeks. She reprots prior to that it was ok. States she lost approximately 5# over that time span. Per chart she had a 9#/5% insignificant wt loss in 2.5 months.  Nutrition-Focused physical exam completed. Findings are no fat depletion, no muscle depletion, and no edema.   Labs and Medications reviewed  Diet Order:  Diet Heart Room service appropriate?: Yes; Fluid consistency:: Thin  Skin:  Reviewed, no issues  Last BM:  12/29  Height:   Ht Readings from Last 1 Encounters:  12/08/15 4\' 9"  (1.448 m)    Weight:   Wt Readings from Last 1 Encounters:  12/10/15 172 lb 6.4 oz (78.2 kg)    Ideal Body Weight:  38.63 kg  BMI:  Body mass index is 37.3 kg/(m^2).  Estimated Nutritional Needs:   Kcal:   2000-2350  Protein:  80-95 grams  Fluid:  >/= 2L  EDUCATION NEEDS:   No education needs identified at this time  Selena Anis. Gauri Galvao, MS, RD LDN After Hours/Weekend Pager [phone]

## 2015-12-10 NOTE — Progress Notes (Signed)
   12/10/15 1600  Clinical Encounter Type  Visited With Patient and family together  Visit Type Initial;Other (Comment) (advance directive)  Referral From Palliative care team  Consult/Referral To Nurse  Spiritual Encounters  Spiritual Needs Other (Comment) (advance directive)  Advance Directives (For Healthcare)  Does patient have an advance directive? Yes  Type of Advance Directive Living will;Healthcare Power of Attorney  Does patient want to make changes to advanced directive? No - Patient declined  Copy of advanced directive(s) in chart? Yes    Chaplain referred by palliative care.  Pt wishing to complete advance directive.  Chaplain provided education and support around completing advance directive.    Pt completed [place] and Living Will.  Chaplain notarized.  Pt has original and copy.  Copy placed in pt chart.     [place], [place]

## 2015-12-11 DIAGNOSIS — E872 Acidosis: Secondary | ICD-10-CM

## 2015-12-11 LAB — RENAL FUNCTION PANEL
ALBUMIN: 3.9 g/dL (ref 3.5–5.0)
Anion gap: 11 (ref 5–15)
BUN: 32 mg/dL — AB (ref 6–20)
CALCIUM: 8.1 mg/dL — AB (ref 8.9–10.3)
CO2: 14 mmol/L — ABNORMAL LOW (ref 22–32)
CREATININE: 2.36 mg/dL — AB (ref 0.44–1.00)
Chloride: 112 mmol/L — ABNORMAL HIGH (ref 101–111)
GFR calc Af Amer: 21 mL/min — ABNORMAL LOW (ref >60)
GFR, EST NON AFRICAN AMERICAN: 18 mL/min — AB (ref >60)
Glucose, Bld: 115 mg/dL — ABNORMAL HIGH (ref 65–99)
PHOSPHORUS: 2.3 mg/dL — AB (ref 2.5–4.6)
Potassium: 3.9 mmol/L (ref 3.5–5.1)
Sodium: 137 mmol/L (ref 135–145)

## 2015-12-11 LAB — CBC
HCT: 32.2 % — ABNORMAL LOW (ref 36.0–46.0)
Hemoglobin: 10.8 g/dL — ABNORMAL LOW (ref 12.0–15.0)
MCH: 28.8 pg (ref 26.0–34.0)
MCHC: 33.5 g/dL (ref 30.0–36.0)
MCV: 85.9 fL (ref 78.0–100.0)
PLATELETS: 215 10*3/uL (ref 150–400)
RBC: 3.75 MIL/uL — ABNORMAL LOW (ref 3.87–5.11)
RDW: 14.2 % (ref 11.5–15.5)
WBC: 10.2 10*3/uL (ref 4.0–10.5)

## 2015-12-11 LAB — MAGNESIUM: MAGNESIUM: 0.9 mg/dL — AB (ref 1.7–2.4)

## 2015-12-11 LAB — GLUCOSE, CAPILLARY: GLUCOSE-CAPILLARY: 79 mg/dL (ref 65–99)

## 2015-12-11 MED ORDER — MAGNESIUM SULFATE 4 GM/100ML IV SOLN
4.0000 g | Freq: Once | INTRAVENOUS | Status: AC
  Administered 2015-12-11: 4 g via INTRAVENOUS
  Filled 2015-12-11: qty 100

## 2015-12-11 MED ORDER — SODIUM BICARBONATE 8.4 % IV SOLN
INTRAVENOUS | Status: DC
  Administered 2015-12-11: 19:00:00 via INTRAVENOUS
  Filled 2015-12-11 (×2): qty 150

## 2015-12-11 NOTE — Progress Notes (Signed)
CRITICAL VALUE ALERT  Critical value received:  Mg+ 0.9  Date of notification:  12/11/15  Time of notification:  [id number]  Critical value read back:Yes.    Nurse who received alert:  Roanna Banning  MD notified (1st page):  Dr. Algis Liming  Time of first page:  30  MD notified (2nd page):  Time of second page:  Responding MD:  Dr. Algis Liming  Time MD responded:  [phone]

## 2015-12-11 NOTE — Progress Notes (Signed)
Physical Therapy Treatment Patient Details Name: [name] MRN: [id number] DOB: 1930-03-04 Today's Date: 12/11/2015    History of Present Illness 81 y.o. female, with a history of hypertension, LBBB, paroxysmal atrial fibrillation and admitted for hyperkalemia and acute kidney failure    PT Comments    Progressing with mobility. Noted audible wheezing and dyspnea while ambulating. O2 sats 96% RA.   Follow Up Recommendations  Home health PT;Supervision for mobility/OOB     Equipment Recommendations  None recommended by PT    Recommendations for Other Services       Precautions / Restrictions Precautions Precautions: Fall Restrictions Weight Bearing Restrictions: No    Mobility  Bed Mobility               General bed mobility comments: oob in recliner  Transfers Overall transfer level: Needs assistance Equipment used: Rolling walker (2 wheeled) Transfers: Sit to/from Stand Sit to Stand: Supervision         General transfer comment: for safety  Ambulation/Gait Ambulation/Gait assistance: Min guard Ambulation Distance (Feet): 160 Feet Assistive device: Rolling walker (2 wheeled) Gait Pattern/deviations: Step-through pattern     General Gait Details: close guard for safety. audible wheezing and dyspnea (~2/4) noted with ambulating. Pt tolerated distance well   Stairs            Wheelchair Mobility    Modified Rankin (Stroke Patients Only)       Balance           Standing balance support: Bilateral upper extremity supported;During functional activity Standing balance-Leahy Scale: Poor Standing balance comment: used rW                    Cognition Arousal/Alertness: Awake/alert Behavior During Therapy: WFL for tasks assessed/performed Overall Cognitive Status: Within Functional Limits for tasks assessed                      Exercises      General Comments        Pertinent Vitals/Pain Pain Assessment:  No/denies pain    Home Living                      Prior Function            PT Goals (current goals can now be found in the care plan section) Progress towards PT goals: Progressing toward goals    Frequency  Min 3X/week    PT Plan Current plan remains appropriate    Co-evaluation             End of Session   Activity Tolerance: Patient tolerated treatment well Patient left: in chair;with call bell/phone within reach;with chair alarm set     Time: 1530-1547 PT Time Calculation (min) (ACUTE ONLY): 17 min  Charges:  $Gait Training: 8-22 mins                    G Codes:      Weston Anna, MPT Pager: [phone]

## 2015-12-11 NOTE — Progress Notes (Addendum)
Patient ID: [name], female   DOB: 01-Aug-1930, 81 y.o.   MRN: [id number] [facility] PROGRESS NOTE  Ikeya Rudd [id number] DOB: Feb 15, 1930 DOA: 12/08/2015 PCP: Geraldo Pitter, MD  Brief narrative:    81 y.o. female, with a history of hypertension, LBBB, paroxysmal atrial fibrillation who presented to [facility] ED with nausea, vomiting, poor po intake over past few days prior to this admission. No abdominal pain, no fevers.  In ED, BP was 137/76, HR 104, RR 17, afebrile. Blood work most significant for potassum of 7 which was initially hemolyzed. Repeat value is again 7 and placed for calcium gluconate, insulin, kayexalate, lasix. Also, Cr was 4.56 and her baseline is WNL.   Pt was admitted to SDU and transferred to medical bed after improvement.  Assessment/Plan:    Active Problems: Hyperkalemia / Acute renal failure / Hypokalemia/non-anion gap metabolic acidosis  - Likely from dehydration,GI losses and spironolactone  - Treated supportively with IV fluids. Acute renal failure improving. Hyperkalemia resolved. Hypokalemic on 12/30 and replaced. No BMP was ordered for today. We'll order BMP.  Hypomagnesemia - Follow BMP and replace as needed  Nausea, vomiting / Dehydration - Likely because of acute renal failure -  resolved. C. difficile testing and GI pathogen panel PCR: Negative  Seizures (HCC) - Continue Keppra - No reports of seizure in hospital   Troponin level elevated / CAD of native artery without angina pectoris  - Likely secondary to demand ischemia from acute renal failure as well as hyperkalemia - 12-lead EKG on the admission showed sinus rhythm - No chest pain  - Continue aspirin and Plavix - Palliative consulted for goals of care, appreciate their input   Anemia, iron deficiency - Continue ferrous sulfate supplementation  - Hemoglobin stable  Essential hypertension / accelerated hypertension - Continue clonidine, hydralazine and norvasc for BP  control - Held spironolactone due to hyperkalemia  - Reasonable control.  Paroxysmal atrial fibrillation - CHADS vasc score at least 3 - On aspirin and plavix - Rate controlled without beta blockers   DVT Prophylaxis  - SCD's bilaterally   Code Status: DO NOT RESUSCITATE.  Family Communication:  plan of care discussed with the patient Disposition Plan: DC when medically stable. Physical therapy recommending home health PT.  IV access:  Peripheral IV  Procedures and diagnostic studies:    Ct Abdomen Pelvis Wo Contrast 12/08/2015  Chronic changes without acute abnormality. Electronically Signed   By: Alcide Clever M.D.   On: 12/08/2015 16:50   Foley catheter-discontinued on 12/31  Medical Consultants:  Palliative care  Other Consultants:  PT Nutrition  IAnti-Infectives:   None     Mccartney Chuba, MD  [facility] Pager: [phone]  Time spent in minutes: 25 minutes  If 7PM-7AM, please contact night-coverage [email or web address] Password [email or web address] 12/11/2015, 4:34 PM   LOS: 3 days    HPI/Subjective: Patient wants Foley catheter removed. Denies any other complaints.   Objective: Filed Vitals:   12/10/15 1451 12/10/15 2139 12/11/15 0507 12/11/15 1335  BP: 140/68 152/74 157/50 115/53  Pulse: 78 72 66 74  Temp: 98.1 F (36.7 C) 98.6 F (37 C) 98.7 F (37.1 C) 98 F (36.7 C)  TempSrc: Oral Oral Oral Oral  Resp: 20 20 19 18   Height:      Weight:      SpO2: 98% 100% 99% 100%    Intake/Output Summary (Last 24 hours) at 12/11/15 1634 Last data filed at 12/11/15 1229  Gross per 24 hour  Intake  480 ml  Output    475 ml  Net      5 ml    Exam:   General:  Pt is alert, not in acute distress  Cardiovascular: RRR, S1/S2 (+)  Respiratory: No wheezing, no crackles, no rhonchi  Abdomen: (+) BS, non tender   Extremities: No leg swelling, palpable pulses. Patient has peripheral IV lines in both feet-we'll plan to remove these if we can get the IV access  and upper extremity.    Neuro: Nonfocal. Alert and oriented.  Data Reviewed: Basic Metabolic Panel:  Recent Labs Lab 12/08/15 1426 12/08/15 1539 12/08/15 1908 12/09/15 0332 12/10/15 0815  NA 136  --  141 147* 144  K 7.1* 7.0* 6.1* 4.6 3.4*  CL 110  --  117* 123* 118*  CO2 9*  --  10* 11* 15*  GLUCOSE 95  --  86 97 82  BUN 60*  --  52* 49* 33*  CREATININE 4.56*  --  3.72* 3.41* 2.71*  CALCIUM 9.9  --  9.4 9.6 8.9  MG  --   --  1.3*  --   --   PHOS  --   --  4.8*  --   --    Liver Function Tests:  Recent Labs Lab 12/08/15 1426  AST 36  ALT 19  ALKPHOS 111  BILITOT 1.5*  PROT 7.6  ALBUMIN 4.7    Recent Labs Lab 12/08/15 1426  LIPASE 34   No results for input(s): AMMONIA in the last 168 hours. CBC:  Recent Labs Lab 12/08/15 1242 12/08/15 1908 12/09/15 0332  WBC 10.1 11.7* 10.0  NEUTROABS  --  9.9*  --   HGB 12.6 10.8* 10.2*  HCT 38.0 32.7* 30.3*  MCV 87.6 87.0 85.4  PLT 461* 303 263   Cardiac Enzymes:  Recent Labs Lab 12/08/15 1908 12/09/15 0050 12/09/15 0657  TROPONINI 0.11* 0.14* 0.15*   BNP: Invalid input(s): POCBNP CBG:  Recent Labs Lab 12/09/15 0752 12/10/15 0738 12/11/15 0819  GLUCAP 85 78 79    Recent Results (from the past 240 hour(s))  C difficile quick scan w PCR reflex     Status: None   Collection Time: 12/08/15  4:28 PM  Result Value Ref Range Status   C Diff antigen NEGATIVE NEGATIVE Final   C Diff toxin NEGATIVE NEGATIVE Final   C Diff interpretation Negative for toxigenic C. difficile  Final  Gastrointestinal Panel by PCR , Stool     Status: None   Collection Time: 12/08/15  4:28 PM  Result Value Ref Range Status   Campylobacter species NOT DETECTED NOT DETECTED Final   Plesimonas shigelloides NOT DETECTED NOT DETECTED Final   Salmonella species NOT DETECTED NOT DETECTED Final   Yersinia enterocolitica NOT DETECTED NOT DETECTED Final   Vibrio species NOT DETECTED NOT DETECTED Final   Vibrio cholerae NOT DETECTED  NOT DETECTED Final   Enteroaggregative E coli (EAEC) NOT DETECTED NOT DETECTED Final   Enteropathogenic E coli (EPEC) NOT DETECTED NOT DETECTED Final   Enterotoxigenic E coli (ETEC) NOT DETECTED NOT DETECTED Final   Shiga like toxin producing E coli (STEC) NOT DETECTED NOT DETECTED Final   E. coli O157 NOT DETECTED NOT DETECTED Final   Shigella/Enteroinvasive E coli (EIEC) NOT DETECTED NOT DETECTED Final   Cryptosporidium NOT DETECTED NOT DETECTED Final   Cyclospora cayetanensis NOT DETECTED NOT DETECTED Final   Entamoeba histolytica NOT DETECTED NOT DETECTED Final   Giardia lamblia NOT DETECTED NOT DETECTED Final  Adenovirus F40/41 NOT DETECTED NOT DETECTED Final   Astrovirus NOT DETECTED NOT DETECTED Final   Norovirus GI/GII NOT DETECTED NOT DETECTED Final   Rotavirus A NOT DETECTED NOT DETECTED Final   Sapovirus (I, II, IV, and V) NOT DETECTED NOT DETECTED Final  MRSA PCR Screening     Status: None   Collection Time: 12/08/15  6:30 PM  Result Value Ref Range Status   MRSA by PCR NEGATIVE NEGATIVE Final     Scheduled Meds: . amLODipine  10 mg Oral Daily  . aspirin EC  81 mg Oral Daily  . calcium gluconate  1 g Intravenous Once  . cloNIDine  0.2 mg Oral BID  . clopidogrel  75 mg Oral Daily  . colchicine  0.3 mg Oral Daily  . dextrose  1 ampule Intravenous Once  . feeding supplement (ENSURE ENLIVE)  237 mL Oral BID BM  . ferrous sulfate  325 mg Oral Q breakfast  . hydrALAZINE  25 mg Oral 4 times per day  . insulin aspart  10 Units Intravenous Once  . levETIRAcetam  500 mg Oral BID  . multivitamins with iron  1 tablet Oral Daily  . sodium chloride  3 mL Intravenous Q12H   Continuous Infusions: . sodium chloride 75 mL/hr at 12/11/15 [phone]

## 2015-12-12 LAB — BASIC METABOLIC PANEL
ANION GAP: 8 (ref 5–15)
BUN: 31 mg/dL — AB (ref 6–20)
CALCIUM: 8.1 mg/dL — AB (ref 8.9–10.3)
CO2: 17 mmol/L — ABNORMAL LOW (ref 22–32)
Chloride: 112 mmol/L — ABNORMAL HIGH (ref 101–111)
Creatinine, Ser: 2.03 mg/dL — ABNORMAL HIGH (ref 0.44–1.00)
GFR calc Af Amer: 25 mL/min — ABNORMAL LOW (ref >60)
GFR, EST NON AFRICAN AMERICAN: 22 mL/min — AB (ref >60)
Glucose, Bld: 106 mg/dL — ABNORMAL HIGH (ref 65–99)
POTASSIUM: 3.5 mmol/L (ref 3.5–5.1)
SODIUM: 137 mmol/L (ref 135–145)

## 2015-12-12 LAB — GLUCOSE, CAPILLARY: Glucose-Capillary: 98 mg/dL (ref 65–99)

## 2015-12-12 LAB — MAGNESIUM: MAGNESIUM: 2.2 mg/dL (ref 1.7–2.4)

## 2015-12-12 MED ORDER — POTASSIUM CHLORIDE CRYS ER 20 MEQ PO TBCR
20.0000 meq | EXTENDED_RELEASE_TABLET | Freq: Once | ORAL | Status: AC
  Administered 2015-12-12: 20 meq via ORAL
  Filled 2015-12-12: qty 1

## 2015-12-12 MED ORDER — SODIUM BICARBONATE 8.4 % IV SOLN
INTRAVENOUS | Status: DC
  Administered 2015-12-12: 11:00:00 via INTRAVENOUS
  Filled 2015-12-12 (×2): qty 150

## 2015-12-12 MED ORDER — ALBUTEROL SULFATE (2.5 MG/3ML) 0.083% IN NEBU
2.5000 mg | INHALATION_SOLUTION | RESPIRATORY_TRACT | Status: DC | PRN
  Administered 2015-12-12: 2.5 mg via RESPIRATORY_TRACT

## 2015-12-12 NOTE — Progress Notes (Addendum)
Patient ID: [name], female   DOB: 1930-12-09, 82 y.o.   MRN: [id number] [facility] PROGRESS NOTE  Aluna Hyche [id number] DOB: [birth year] DOA: 12/08/2015 PCP: Geraldo Pitter, MD  Brief narrative:    82 y.o. female, with a history of hypertension, LBBB, paroxysmal atrial fibrillation who presented to [facility] ED with nausea, vomiting, poor po intake over past few days prior to this admission. No abdominal pain, no fevers.  In ED, BP was 137/76, HR 104, RR 17, afebrile. Blood work most significant for potassum of 7 which was initially hemolyzed. Repeat value is again 7 and placed for calcium gluconate, insulin, kayexalate, lasix. Also, Cr was 4.56 and her baseline is WNL.   Pt was admitted to SDU and transferred to medical bed after improvement.  Assessment/Plan:    Active Problems: Hyperkalemia / Acute renal failure / Hypokalemia/non-anion gap metabolic acidosis  - Likely from dehydration,GI losses and spironolactone  - Treated supportively with IV fluids. Acute renal failure improving. Hyperkalemia resolved. Hypokalemic on 12/30 and replaced.  - Improving. Continue to replace potassium. Continue bicarbonate drip for additional 24 hours.  Hypomagnesemia - 0.9 on 12/31. Replaced.  Nausea, vomiting / Dehydration - Likely because of acute renal failure -  resolved. C. difficile testing and GI pathogen panel PCR: Negative  Seizures (HCC) - Continue Keppra - No reports of seizure in hospital   Troponin level elevated / CAD of native artery without angina pectoris  - Likely secondary to demand ischemia from acute renal failure as well as hyperkalemia - 12-lead EKG on the admission showed sinus rhythm - No chest pain  - Continue aspirin and Plavix - Palliative consulted for goals of care, appreciate their input   Anemia, iron deficiency - Continue ferrous sulfate supplementation  - Hemoglobin stable  Essential hypertension / accelerated hypertension - Continue  clonidine, hydralazine and norvasc for BP control - Held spironolactone due to hyperkalemia  - Reasonable control.  Paroxysmal atrial fibrillation - CHADS vasc score at least 3 - On aspirin and plavix - Rate controlled without beta blockers   DVT Prophylaxis  - SCD's bilaterally   Code Status: DO NOT RESUSCITATE.  Family Communication:  plan of care discussed with the patient Disposition Plan: DC when medically stable-possibly in the next 48 hours. Physical therapy recommending home health PT.  IV access:  Peripheral IV  Procedures and diagnostic studies:    Ct Abdomen Pelvis Wo Contrast 12/08/2015  Chronic changes without acute abnormality. Electronically Signed   By: Alcide Clever M.D.   On: 12/08/2015 16:50   Foley catheter-discontinued on 12/31  Medical Consultants:  Palliative care  Other Consultants:  PT Nutrition  IAnti-Infectives:   None     Jodell Weitman, MD  [facility] Pager: [phone]  Time spent in minutes: 25 minutes  If 7PM-7AM, please contact night-coverage [email or web address] Password [email or web address] 12/12/2015, 1:01 PM   LOS: 4 days    HPI/Subjective: Chronic dyspnea on exertion (unchanged). No chest pain reported. Foley discontinued. Able to void. Ambulated yesterday. Denies any other complaints.  Objective: Filed Vitals:   12/11/15 0507 12/11/15 1335 12/11/15 2243 12/12/15 0636  BP: 157/50 115/53 148/61 126/53  Pulse: 66 74 77 63  Temp: 98.7 F (37.1 C) 98 F (36.7 C) 98.5 F (36.9 C) 98.3 F (36.8 C)  TempSrc: Oral Oral Oral Oral  Resp: 19 18 18 16   Height:      Weight:      SpO2: 99% 100% 100% 100%    Intake/Output Summary (Last 24 hours)  at 12/12/15 1301 Last data filed at 12/12/15 1240  Gross per 24 hour  Intake   5627 ml  Output   1275 ml  Net   4352 ml    Exam:   General:  Pleasant elderly female lying comfortably propped up in bed.  Cardiovascular: S1 and S2 heard, RRR. No JVD, murmurs or pedal edema.  Respiratory:  Clear to auscultation when seen this morning. Subsequently informed that patient was wheezing slightly after exertion.  Abdomen: (+) BS, non tender   Extremities: Symmetric 5 x 5 power.  Neuro: Nonfocal. Alert and oriented.  Data Reviewed: Basic Metabolic Panel:  Recent Labs Lab 12/08/15 1908 12/09/15 0332 12/10/15 0815 12/11/15 1653 12/12/15 0513  NA 141 147* 144 137 137  K 6.1* 4.6 3.4* 3.9 3.5  CL 117* 123* 118* 112* 112*  CO2 10* 11* 15* 14* 17*  GLUCOSE 86 97 82 115* 106*  BUN 52* 49* 33* 32* 31*  CREATININE 3.72* 3.41* 2.71* 2.36* 2.03*  CALCIUM 9.4 9.6 8.9 8.1* 8.1*  MG 1.3*  --   --  0.9* 2.2  PHOS 4.8*  --   --  2.3*  --    Liver Function Tests:  Recent Labs Lab 12/08/15 1426 12/11/15 1653  AST 36  --   ALT 19  --   ALKPHOS 111  --   BILITOT 1.5*  --   PROT 7.6  --   ALBUMIN 4.7 3.9    Recent Labs Lab 12/08/15 1426  LIPASE 34   No results for input(s): AMMONIA in the last 168 hours. CBC:  Recent Labs Lab 12/08/15 1242 12/08/15 1908 12/09/15 0332 12/11/15 1653  WBC 10.1 11.7* 10.0 10.2  NEUTROABS  --  9.9*  --   --   HGB 12.6 10.8* 10.2* 10.8*  HCT 38.0 32.7* 30.3* 32.2*  MCV 87.6 87.0 85.4 85.9  PLT 461* 303 263 215   Cardiac Enzymes:  Recent Labs Lab 12/08/15 1908 12/09/15 0050 12/09/15 0657  TROPONINI 0.11* 0.14* 0.15*   BNP: Invalid input(s): POCBNP CBG:  Recent Labs Lab 12/09/15 0752 12/10/15 0738 12/11/15 0819 12/12/15 0818  GLUCAP 85 78 79 98    Recent Results (from the past 240 hour(s))  C difficile quick scan w PCR reflex     Status: None   Collection Time: 12/08/15  4:28 PM  Result Value Ref Range Status   C Diff antigen NEGATIVE NEGATIVE Final   C Diff toxin NEGATIVE NEGATIVE Final   C Diff interpretation Negative for toxigenic C. difficile  Final  Gastrointestinal Panel by PCR , Stool     Status: None   Collection Time: 12/08/15  4:28 PM  Result Value Ref Range Status   Campylobacter species NOT  DETECTED NOT DETECTED Final   Plesimonas shigelloides NOT DETECTED NOT DETECTED Final   Salmonella species NOT DETECTED NOT DETECTED Final   Yersinia enterocolitica NOT DETECTED NOT DETECTED Final   Vibrio species NOT DETECTED NOT DETECTED Final   Vibrio cholerae NOT DETECTED NOT DETECTED Final   Enteroaggregative E coli (EAEC) NOT DETECTED NOT DETECTED Final   Enteropathogenic E coli (EPEC) NOT DETECTED NOT DETECTED Final   Enterotoxigenic E coli (ETEC) NOT DETECTED NOT DETECTED Final   Shiga like toxin producing E coli (STEC) NOT DETECTED NOT DETECTED Final   E. coli O157 NOT DETECTED NOT DETECTED Final   Shigella/Enteroinvasive E coli (EIEC) NOT DETECTED NOT DETECTED Final   Cryptosporidium NOT DETECTED NOT DETECTED Final   Cyclospora cayetanensis NOT DETECTED  NOT DETECTED Final   Entamoeba histolytica NOT DETECTED NOT DETECTED Final   Giardia lamblia NOT DETECTED NOT DETECTED Final   Adenovirus F40/41 NOT DETECTED NOT DETECTED Final   Astrovirus NOT DETECTED NOT DETECTED Final   Norovirus GI/GII NOT DETECTED NOT DETECTED Final   Rotavirus A NOT DETECTED NOT DETECTED Final   Sapovirus (I, II, IV, and V) NOT DETECTED NOT DETECTED Final  MRSA PCR Screening     Status: None   Collection Time: 12/08/15  6:30 PM  Result Value Ref Range Status   MRSA by PCR NEGATIVE NEGATIVE Final     Scheduled Meds: . amLODipine  10 mg Oral Daily  . aspirin EC  81 mg Oral Daily  . cloNIDine  0.2 mg Oral BID  . clopidogrel  75 mg Oral Daily  . colchicine  0.3 mg Oral Daily  . feeding supplement (ENSURE ENLIVE)  237 mL Oral BID BM  . ferrous sulfate  325 mg Oral Q breakfast  . hydrALAZINE  25 mg Oral 4 times per day  . insulin aspart  10 Units Intravenous Once  . levETIRAcetam  500 mg Oral BID  . multivitamins with iron  1 tablet Oral Daily  . sodium chloride  3 mL Intravenous Q12H   Continuous Infusions: .  sodium bicarbonate  infusion 1000 mL 75 mL/hr at 12/12/15 1035

## 2015-12-12 NOTE — Progress Notes (Signed)
Pt experiencing SOB with exertion (states she does experience this at home) and has exp wheezing.  Pt frequently been using BSC. Pt requesting breathing tx.  MD paged, stated he would order.

## 2015-12-12 NOTE — Progress Notes (Signed)
Assisted pt to [facility], pt having difficulty passing stool.  Disimpacted pt per her request.  Tolerated well.  Pt requesting to have stool softener.  Page MD to make aware, no response yet.  Please order if MD agrees.

## 2015-12-13 ENCOUNTER — Inpatient Hospital Stay (HOSPITAL_COMMUNITY): Payer: Medicare Other

## 2015-12-13 DIAGNOSIS — N189 Chronic kidney disease, unspecified: Secondary | ICD-10-CM

## 2015-12-13 DIAGNOSIS — J209 Acute bronchitis, unspecified: Secondary | ICD-10-CM

## 2015-12-13 LAB — RENAL FUNCTION PANEL
ALBUMIN: 2.9 g/dL — AB (ref 3.5–5.0)
ANION GAP: 8 (ref 5–15)
BUN: 32 mg/dL — AB (ref 6–20)
CALCIUM: 8.4 mg/dL — AB (ref 8.9–10.3)
CO2: 24 mmol/L (ref 22–32)
CREATININE: 2.11 mg/dL — AB (ref 0.44–1.00)
Chloride: 103 mmol/L (ref 101–111)
GFR calc Af Amer: 24 mL/min — ABNORMAL LOW (ref >60)
GFR calc non Af Amer: 21 mL/min — ABNORMAL LOW (ref >60)
GLUCOSE: 114 mg/dL — AB (ref 65–99)
Phosphorus: 2.4 mg/dL — ABNORMAL LOW (ref 2.5–4.6)
Potassium: 3.8 mmol/L (ref 3.5–5.1)
Sodium: 135 mmol/L (ref 135–145)

## 2015-12-13 MED ORDER — SENNA 8.6 MG PO TABS
2.0000 | ORAL_TABLET | Freq: Every day | ORAL | Status: DC
  Administered 2015-12-13 – 2015-12-14 (×2): 17.2 mg via ORAL
  Filled 2015-12-13 (×2): qty 2

## 2015-12-13 MED ORDER — IPRATROPIUM-ALBUTEROL 0.5-2.5 (3) MG/3ML IN SOLN
3.0000 mL | Freq: Four times a day (QID) | RESPIRATORY_TRACT | Status: DC
  Administered 2015-12-13: 3 mL via RESPIRATORY_TRACT

## 2015-12-13 MED ORDER — BISACODYL 10 MG RE SUPP
10.0000 mg | Freq: Once | RECTAL | Status: DC
  Filled 2015-12-13: qty 1

## 2015-12-13 MED ORDER — DOXYCYCLINE HYCLATE 100 MG PO TABS
100.0000 mg | ORAL_TABLET | Freq: Two times a day (BID) | ORAL | Status: DC
  Administered 2015-12-13 – 2015-12-14 (×3): 100 mg via ORAL
  Filled 2015-12-13 (×2): qty 1

## 2015-12-13 MED ORDER — FUROSEMIDE 20 MG PO TABS
20.0000 mg | ORAL_TABLET | Freq: Once | ORAL | Status: AC
  Administered 2015-12-13: 20 mg via ORAL
  Filled 2015-12-13: qty 1

## 2015-12-13 MED ORDER — IPRATROPIUM-ALBUTEROL 0.5-2.5 (3) MG/3ML IN SOLN
3.0000 mL | Freq: Three times a day (TID) | RESPIRATORY_TRACT | Status: DC
  Administered 2015-12-14 (×2): 3 mL via RESPIRATORY_TRACT
  Filled 2015-12-13 (×2): qty 3

## 2015-12-13 MED ORDER — BISACODYL 10 MG RE SUPP: 10.0000 mg | Freq: Every day | RECTAL | Status: DC | PRN

## 2015-12-13 MED ORDER — SODIUM CHLORIDE 0.9 % IV SOLN: INTRAVENOUS | Status: DC

## 2015-12-13 NOTE — Progress Notes (Addendum)
Patient ID: [name], female   DOB: 1930-08-10, 82 y.o.   MRN: [id number] [facility] PROGRESS NOTE  Corena Debose [id number] DOB: October 24, 1930 DOA: 12/08/2015 PCP: Geraldo Pitter, MD  Brief narrative:    82 y.o. female, with a history of HTN, chronic LBBB, remote stress testing 2005, PAD (RAS) paroxysmal atrial fibrillation, Iron def anemia, seizure, prior SDH & GI bleed who presented to [facility] ED with nausea, vomiting, poor po intake over past few days prior to this admission. No abdominal pain, no fevers.  In ED, BP was 137/76, HR 104, RR 17, afebrile. Blood work most significant for potassum of 7 which was initially hemolyzed. Repeat value is again 7 and placed for calcium gluconate, insulin, kayexalate, lasix. Also, Cr was 4.56 and her baseline is WNL.   Pt was admitted to SDU and transferred to medical bed after improvement.  Assessment/Plan:    Active Problems: Hyperkalemia / Acute on chronic kidney disease / Hypokalemia/non-anion gap metabolic acidosis  - Likely from dehydration,GI losses and spironolactone  - Treated with IV fluids and bicarbonate drip. Hyperkalemia, hypokalemia & NAG resolved. - On reviewing chart, creatinine prior to recent admission was 1.66 on 9/21. Baseline creatinine probably in the 1.5-1.6 range. Admitted with creatinine of 4.56 which has gradually improved but plateaued in the low 2 range over the last 24 hours. - Patient complains of some dyspnea? Worse than usual on 1/2 hence IV fluids discontinued. Follow BMP in a.m.  Hypomagnesemia - 0.9 on 12/31. Replaced.  Nausea, vomiting / Dehydration - Likely because of acute renal failure -  resolved. C. difficile testing and GI pathogen panel PCR: Negative  Seizures (HCC) - Continue Keppra - No reports of seizure in hospital   Troponin level elevated / CAD of native artery without angina pectoris  - Likely secondary to demand ischemia from acute renal failure as well as hyperkalemia - 12-lead  EKG on the admission showed sinus rhythm - No chest pain  - Continue aspirin and Plavix - Palliative consulted for goals of care, appreciate their input   Anemia, iron deficiency - Continue ferrous sulfate supplementation  - Hemoglobin stable  Essential hypertension / accelerated hypertension - Continue clonidine, hydralazine and norvasc for BP control - Held spironolactone due to hyperkalemia  - Reasonable control.  Paroxysmal atrial fibrillation - CHADS vasc score at least 3 - On aspirin and plavix - Rate controlled without beta blockers  Dyspnea & wheezing, possible acute bronchitis - Patient has chronic exertional dyspnea at baseline. She may have some worsening of her dyspnea today. Chest x-ray suggests acute bronchitis. - We'll start oral doxycycline and bronchodilator nebulizations. Hold starting steroids at this time. Follow clinically.  Fecal Impaction/constipation - bowel regimen.  DVT Prophylaxis  - SCD's bilaterally   Code Status: DO NOT RESUSCITATE. Family Communication:  plan of care discussed with the patient Disposition Plan: DC when medically stable-possibly in the next 24 hours. Physical therapy recommending home health PT.  IV access:  Peripheral IV  Procedures and diagnostic studies:    Ct Abdomen Pelvis Wo Contrast 12/08/2015  Chronic changes without acute abnormality. Electronically Signed   By: Alcide Clever M.D.   On: 12/08/2015 16:50   Foley catheter-discontinued on 12/31  Medical Consultants:  Palliative care  Other Consultants:  PT Nutrition  IAnti-Infectives:   None     Samiah Ricklefs, MD  [facility] Pager: [phone]  Time spent in minutes: 25 minutes  If 7PM-7AM, please contact night-coverage [email or web address] Password [email or web address] 12/13/2015, 3:10 PM  LOS: 5 days    HPI/Subjective: ? Worsening dyspnea and wheezing. Had fecal disimpaction 1/1.  Objective: Filed Vitals:   12/12/15 2033 12/13/15 0040 12/13/15 0605 12/13/15  1400  BP: 142/51 136/63 130/54 137/58  Pulse: 74 70 65 69  Temp: 98.7 F (37.1 C)  98.9 F (37.2 C) 98.4 F (36.9 C)  TempSrc: Oral  Oral Oral  Resp: 16  16 16   Height:      Weight:      SpO2: 100%  98% 99%    Intake/Output Summary (Last 24 hours) at 12/13/15 1510 Last data filed at 12/13/15 1300  Gross per 24 hour  Intake   1002 ml  Output   1350 ml  Net   -348 ml    Exam:   General:  Pleasant elderly female lying comfortably propped up in bed.  Cardiovascular: S1 and S2 heard, RRR. No JVD, murmurs. Trace ankle edema.  Respiratory: Occasional bilateral medium pitched expiratory rhonchi but reasonable air entry. No increased work of breathing.  Abdomen: (+) BS, non tender   Extremities: Symmetric 5 x 5 power.  Neuro: Nonfocal. Alert and oriented.  Data Reviewed: Basic Metabolic Panel:  Recent Labs Lab 12/08/15 1908 12/09/15 0332 12/10/15 0815 12/11/15 1653 12/12/15 0513 12/13/15 0530  NA 141 147* 144 137 137 135  K 6.1* 4.6 3.4* 3.9 3.5 3.8  CL 117* 123* 118* 112* 112* 103  CO2 10* 11* 15* 14* 17* 24  GLUCOSE 86 97 82 115* 106* 114*  BUN 52* 49* 33* 32* 31* 32*  CREATININE 3.72* 3.41* 2.71* 2.36* 2.03* 2.11*  CALCIUM 9.4 9.6 8.9 8.1* 8.1* 8.4*  MG 1.3*  --   --  0.9* 2.2  --   PHOS 4.8*  --   --  2.3*  --  2.4*   Liver Function Tests:  Recent Labs Lab 12/08/15 1426 12/11/15 1653 12/13/15 0530  AST 36  --   --   ALT 19  --   --   ALKPHOS 111  --   --   BILITOT 1.5*  --   --   PROT 7.6  --   --   ALBUMIN 4.7 3.9 2.9*    Recent Labs Lab 12/08/15 1426  LIPASE 34   No results for input(s): AMMONIA in the last 168 hours. CBC:  Recent Labs Lab 12/08/15 1242 12/08/15 1908 12/09/15 0332 12/11/15 1653  WBC 10.1 11.7* 10.0 10.2  NEUTROABS  --  9.9*  --   --   HGB 12.6 10.8* 10.2* 10.8*  HCT 38.0 32.7* 30.3* 32.2*  MCV 87.6 87.0 85.4 85.9  PLT 461* 303 263 215   Cardiac Enzymes:  Recent Labs Lab 12/08/15 1908 12/09/15 0050  12/09/15 0657  TROPONINI 0.11* 0.14* 0.15*   BNP: Invalid input(s): POCBNP CBG:  Recent Labs Lab 12/09/15 0752 12/10/15 0738 12/11/15 0819 12/12/15 0818  GLUCAP 85 78 79 98    Recent Results (from the past 240 hour(s))  C difficile quick scan w PCR reflex     Status: None   Collection Time: 12/08/15  4:28 PM  Result Value Ref Range Status   C Diff antigen NEGATIVE NEGATIVE Final   C Diff toxin NEGATIVE NEGATIVE Final   C Diff interpretation Negative for toxigenic C. difficile  Final  Gastrointestinal Panel by PCR , Stool     Status: None   Collection Time: 12/08/15  4:28 PM  Result Value Ref Range Status   Campylobacter species NOT DETECTED NOT DETECTED Final  Plesimonas shigelloides NOT DETECTED NOT DETECTED Final   Salmonella species NOT DETECTED NOT DETECTED Final   Yersinia enterocolitica NOT DETECTED NOT DETECTED Final   Vibrio species NOT DETECTED NOT DETECTED Final   Vibrio cholerae NOT DETECTED NOT DETECTED Final   Enteroaggregative E coli (EAEC) NOT DETECTED NOT DETECTED Final   Enteropathogenic E coli (EPEC) NOT DETECTED NOT DETECTED Final   Enterotoxigenic E coli (ETEC) NOT DETECTED NOT DETECTED Final   Shiga like toxin producing E coli (STEC) NOT DETECTED NOT DETECTED Final   E. coli O157 NOT DETECTED NOT DETECTED Final   Shigella/Enteroinvasive E coli (EIEC) NOT DETECTED NOT DETECTED Final   Cryptosporidium NOT DETECTED NOT DETECTED Final   Cyclospora cayetanensis NOT DETECTED NOT DETECTED Final   Entamoeba histolytica NOT DETECTED NOT DETECTED Final   Giardia lamblia NOT DETECTED NOT DETECTED Final   Adenovirus F40/41 NOT DETECTED NOT DETECTED Final   Astrovirus NOT DETECTED NOT DETECTED Final   Norovirus GI/GII NOT DETECTED NOT DETECTED Final   Rotavirus A NOT DETECTED NOT DETECTED Final   Sapovirus (I, II, IV, and V) NOT DETECTED NOT DETECTED Final  MRSA PCR Screening     Status: None   Collection Time: 12/08/15  6:30 PM  Result Value Ref Range  Status   MRSA by PCR NEGATIVE NEGATIVE Final     Scheduled Meds: . amLODipine  10 mg Oral Daily  . aspirin EC  81 mg Oral Daily  . bisacodyl  10 mg Rectal Once  . cloNIDine  0.2 mg Oral BID  . clopidogrel  75 mg Oral Daily  . colchicine  0.3 mg Oral Daily  . feeding supplement (ENSURE ENLIVE)  237 mL Oral BID BM  . ferrous sulfate  325 mg Oral Q breakfast  . hydrALAZINE  25 mg Oral 4 times per day  . levETIRAcetam  500 mg Oral BID  . multivitamins with iron  1 tablet Oral Daily  . senna  2 tablet Oral Daily  . sodium chloride  3 mL Intravenous Q12H   Continuous Infusions:

## 2015-12-14 DIAGNOSIS — N183 Chronic kidney disease, stage 3 (moderate): Secondary | ICD-10-CM

## 2015-12-14 LAB — BASIC METABOLIC PANEL
Anion gap: 9 (ref 5–15)
BUN: 33 mg/dL — ABNORMAL HIGH (ref 6–20)
CO2: 26 mmol/L (ref 22–32)
CREATININE: 2.2 mg/dL — AB (ref 0.44–1.00)
Calcium: 9 mg/dL (ref 8.9–10.3)
Chloride: 102 mmol/L (ref 101–111)
GFR calc Af Amer: 23 mL/min — ABNORMAL LOW (ref >60)
GFR calc non Af Amer: 20 mL/min — ABNORMAL LOW (ref >60)
GLUCOSE: 111 mg/dL — AB (ref 65–99)
Potassium: 4.1 mmol/L (ref 3.5–5.1)
Sodium: 137 mmol/L (ref 135–145)

## 2015-12-14 LAB — GLUCOSE, CAPILLARY: GLUCOSE-CAPILLARY: 100 mg/dL — AB (ref 65–99)

## 2015-12-14 MED ORDER — HYDRALAZINE HCL 25 MG PO TABS: 25.0000 mg | ORAL_TABLET | Freq: Three times a day (TID) | ORAL | Status: DC

## 2015-12-14 MED ORDER — COLCHICINE 0.6 MG PO TABS: 0.3000 mg | ORAL_TABLET | Freq: Every day | ORAL | Status: DC

## 2015-12-14 MED ORDER — DOXYCYCLINE HYCLATE 100 MG PO TABS: 100.0000 mg | ORAL_TABLET | Freq: Two times a day (BID) | ORAL | Status: DC

## 2015-12-14 MED ORDER — AMLODIPINE BESYLATE 10 MG PO TABS: 10.0000 mg | ORAL_TABLET | Freq: Every day | ORAL | Status: DC

## 2015-12-14 MED ORDER — CLONIDINE HCL 0.1 MG PO TABS: 0.2000 mg | ORAL_TABLET | Freq: Two times a day (BID) | ORAL | Status: DC

## 2015-12-14 MED ORDER — ALBUTEROL SULFATE HFA 108 (90 BASE) MCG/ACT IN AERS: 2.0000 | INHALATION_SPRAY | Freq: Four times a day (QID) | RESPIRATORY_TRACT | Status: DC | PRN

## 2015-12-14 MED ORDER — ENSURE ENLIVE PO LIQD: 237.0000 mL | Freq: Two times a day (BID) | ORAL | Status: DC

## 2015-12-14 NOTE — Care Management Important Message (Signed)
Important Message  Patient Details  Name: [name] MRN: [id number] Date of Birth: 09/17/1930   Medicare Important Message Given:  Yes    Camillo Flaming 12/14/2015, 2:10 [place] Message  Patient Details  Name: [name] MRN: [id number] Date of Birth: Aug 09, 1930   Medicare Important Message Given:  Yes    Camillo Flaming 12/14/2015, 2:10 PM

## 2015-12-14 NOTE — Care Management Note (Addendum)
Case Management Note  Patient Details  Name: [name] MRN: [id number] Date of Birth: 11/02/1930  Subjective/Objective:         82 yo admitted with vomiting           Action/Plan: From home with daughter  Expected Discharge Date:   (UNKNOWN)               Expected Discharge Plan:  [facility]  In-House Referral:     Discharge planning Services  CM Consult  Post Acute Care Choice:  Home Health Choice offered to:  Patient  DME Arranged:    DME Agency:     HH Arranged:  PT [place]:  [facility]  Status of Service:  In process, will continue to follow  Medicare Important Message Given:    Date Medicare IM Given:    Medicare IM give by:    Date Additional Medicare IM Given:    Additional Medicare Important Message give by:     If discussed at [place] of Stay Meetings, dates discussed:    Additional Comments: PT is recommending HHPT at discharge. This CM met with pt to offer choice. Pt states she has used [facility] in the past and would like to use them again. [facility] rep alerted of referral. Will need MD order for HHPT.  No other CM needs communicated at this time. CM will continue to follow. Lynnell Catalan, RN 12/14/2015, 10:12 AM

## 2015-12-14 NOTE — Discharge Instructions (Signed)
Acute Kidney Injury °Acute kidney injury is any condition in which there is sudden (acute) damage to the kidneys. Acute kidney injury was previously known as acute kidney failure or acute renal failure. The kidneys are two organs that lie on either side of the spine between the middle of the back and the front of the abdomen. The kidneys: °· Remove wastes and extra water from the blood.   °· Produce important hormones. These help keep bones strong, regulate blood pressure, and help create red blood cells.   °· Balance the fluids and chemicals in the blood and tissues. °A small amount of kidney damage may not cause problems, but a large amount of damage may make it difficult or impossible for the kidneys to work the way they should. Acute kidney injury may develop into long-lasting (chronic) kidney disease. It may also develop into a life-threatening disease called end-stage kidney disease. Acute kidney injury can get worse very quickly, so it should be treated right away. Early treatment may prevent other kidney diseases from developing. °CAUSES  °· A problem with blood flow to the kidneys. This may be caused by:   °¨ Blood loss.   °¨ Heart disease.   °¨ Severe burns.   °¨ Liver disease. °· Direct damage to the kidneys. This may be caused by: °¨ Some medicines.   °¨ A kidney infection.   °¨ Poisoning or consuming toxic substances.   °¨ A surgical wound.   °¨ A blow to the kidney area.   °· A problem with urine flow. This may be caused by:   °¨ Cancer.   °¨ Kidney stones.   °¨ An enlarged prostate. °SIGNS AND SYMPTOMS  °· Swelling (edema) of the legs, ankles, or feet.   °· Tiredness (lethargy).   °· Nausea or vomiting.   °· Confusion.   °· Problems with urination, such as:   °¨ Painful or burning feeling during urination.   °¨ Decreased urine production.   °¨ Frequent accidents in children who are potty trained.   °¨ Bloody urine.   °· Muscle twitches and cramps.   °· Shortness of breath.   °· Seizures.   °· Chest  pain or pressure. °Sometimes, no symptoms are present.  °DIAGNOSIS °Acute kidney injury may be detected and diagnosed by tests, including blood, urine, imaging, or kidney biopsy tests.  °TREATMENT °Treatment of acute kidney injury varies depending on the cause and severity of the kidney damage. In mild cases, no treatment may be needed. The kidneys may heal on their own. If acute kidney injury is more severe, your health care provider will treat the cause of the kidney damage, help the kidneys heal, and prevent complications from occurring. Severe cases may require a procedure to remove toxic wastes from the body (dialysis) or surgery to repair kidney damage. Surgery may involve:  °· Repair of a torn kidney.   °· Removal of an obstruction. °HOME CARE INSTRUCTIONS °· Follow your prescribed diet. °· Take medicines only as directed by your health care provider.  °· Do not take any new medicines (prescription, over-the-counter, or nutritional supplements) unless approved by your health care provider. Many medicines can worsen your kidney damage or may need to have the dose adjusted.   °· Keep all follow-up visits as directed by your health care provider. This is important. °· Observe your condition to make sure you are healing as expected. °SEEK IMMEDIATE MEDICAL CARE IF: °· You are feeling ill or have severe pain in the back or side.   °· Your symptoms return or you have new symptoms. °· You have any symptoms of end-stage kidney disease. These include:   °¨ Persistent itchiness.   °¨   Loss of appetite.   °¨ Headaches.   °¨ Abnormally dark or light skin. °¨ Numbness in the hands or feet.   °¨ Easy bruising.   °¨ Frequent hiccups.   °¨ Menstruation stops.   °· You have a fever. °· You have increased urine production. °· You have pain or bleeding when urinating. °MAKE SURE YOU:  °· Understand these instructions. °· Will watch your condition. °· Will get help right away if you are not doing well or get worse. °  °This  information is not intended to replace advice given to you by your health care provider. Make sure you discuss any questions you have with your health care provider. °  °Document Released: 06/12/2011 Document Revised: 12/18/2014 Document Reviewed: 07/26/2012 °[employer] Interactive Patient Education ©2016 [employer] ° °

## 2015-12-14 NOTE — Discharge Summary (Signed)
Physician Discharge Summary  [name] [id number] DOB: Apr 10, 1930 DOA: 12/08/2015  PCP: [name], MD  Admit date: 12/08/2015 Discharge date: 12/14/2015  Time spent: Greater than 30 minutes  Recommendations for Outpatient Follow-up:  1. Dr. Lucianne Lei, PCP in 5 days with repeat labs (CBC & BMP). 2. Dr. Grayland Jack, Cardiology in 10 days 3. Home Health Physical Therapy  Discharge Diagnoses:  Active Problems:   Essential hypertension   Seizures (HCC)   Vomiting   ARF (acute renal failure) (HCC)   Hyperkalemia   Hypernatremia   Troponin level elevated   Anemia, iron deficiency   Coronary artery disease involving native coronary artery of native heart without angina pectoris   Hypokalemia   AKI (acute kidney injury) ([place])   DNR (do not resuscitate)   Palliative care encounter   Weakness   Discharge Condition: Improved & Stable  Diet recommendation: Heart healthy diet.  Filed Weights   12/09/15 0500 12/10/15 0500 12/14/15 0456  Weight: 78 kg (171 lb 15.3 oz) 78.2 kg (172 lb 6.4 oz) 78.064 kg (172 lb 1.6 oz)    History of present illness:  82 y.o. female, with a history of HTN, chronic LBBB, remote stress testing 2005, PAD (RAS), paroxysmal atrial fibrillation, Iron def anemia, seizure, prior SDH & GI bleed who presented to [facility] ED with nausea, vomiting, poor po intake over past few days prior to this admission. No abdominal pain, no fevers.  In ED, BP was 137/76, HR 104, RR 17, afebrile. Blood work most significant for potassum of 7 which was initially hemolyzed. Repeat value is again 7 and placed for calcium gluconate, insulin, kayexalate, lasix. Also, Cr was 4.56 and her baseline is WNL.   Pt was admitted to SDU and transferred to medical bed after improvement.  Hospital Course:   Active Problems: Hyperkalemia / Acute on stage III chronic kidney disease / Hypokalemia/non-anion gap metabolic acidosis  - Likely from dehydration,GI losses, meds -  (ARB) and  spironolactone  - Treated with IV fluids and bicarbonate drip. Hyperkalemia, hypokalemia & NAG resolved. - On reviewing chart, creatinine prior to recent admission was 1.66 on 9/21. Baseline creatinine probably in the 1.5-1.6 range. Admitted with creatinine of 4.56 which has gradually improved but plateaued in the low 2 range. - Recommend close outpatient follow-up with repeat BMP and consider outpatient nephrology consultation. - Discontinued ARB and Aldactone. Patient has history of documented RAS which may need further outpatient evaluation.  Hypomagnesemia - 0.9 on 12/31. Replaced.  Nausea, vomiting / Dehydration - Likely because of acute renal failure - resolved. C. difficile testing and GI pathogen panel PCR: Negative  Seizures ([place]) - Continue Keppra - No reports of seizure in hospital   Troponin level elevated / CAD of native artery without angina pectoris  - Likely secondary to demand ischemia from acute renal failure as well as hyperkalemia - 12-lead EKG on the admission showed sinus rhythm - No chest pain  - Continue aspirin and Plavix - Palliative consulted for goals of care, appreciate their input   Anemia, iron deficiency - Continue ferrous sulfate supplementation  - Hemoglobin stable  Essential hypertension / accelerated hypertension - Continue clonidine, hydralazine and norvasc for BP control - Discontinued ARB and Aldactone. - Reasonable control.  Paroxysmal atrial fibrillation - CHADS vasc score at least 3 - On aspirin and plavix - Rate controlled without beta blockers  Dyspnea & wheezing, possible acute bronchitis - Patient has chronic exertional dyspnea at baseline. She had mild worsening of her dyspnea  1/2. Chest x-ray suggests acute bronchitis. - started oral doxycycline and bronchodilator nebulizations.  - She also received a dose of oral Lasix. Clinically improved and dyspnea back at baseline.   Fecal Impaction/constipation - bowel regimen. had  BMs.   Discussed with patient's daughter Ms. Hope Budds. Updated care and answered questions.    Discharge Exam:  Complaints:  feels much better. Dyspnea improved and is at baseline. Intermittent mostly dry cough but at times has specks of blood in sputum and on blowing nose. No chest pain. Urinating well.   Filed Vitals:   12/13/15 2107 12/14/15 0456 12/14/15 0826 12/14/15 1304  BP:  143/57  121/48  Pulse:  72  84  Temp:  99 F (37.2 C)  98.6 F (37 C)  TempSrc:  Oral  Oral  Resp:  16  18  Height:      Weight:  78.064 kg (172 lb 1.6 oz)    SpO2: 98% 96% 96% 98%     General: Pleasant elderly female sitting up comfortably in chair this morning.  Cardiovascular: S1 and S2 heard, RRR. No JVD, murmurs. No pedal edema.  Respiratory: Clear to auscultation. No increased work of breathing.  Abdomen: (+) BS, non tender   Extremities: Symmetric 5 x 5 power.  Neuro: Nonfocal. Alert and oriented.  Discharge Instructions      Discharge Instructions    (HEART FAILURE PATIENTS) Call MD:  Anytime you have any of the following symptoms: 1) 3 pound weight gain in 24 hours or 5 pounds in 1 week 2) shortness of breath, with or without a dry hacking cough 3) swelling in the hands, feet or stomach 4) if you have to sleep on extra pillows at night in order to breathe.    Complete by:  As directed      Call MD for:  difficulty breathing, headache or visual disturbances    Complete by:  As directed      Call MD for:  extreme fatigue    Complete by:  As directed      Call MD for:  persistant dizziness or light-headedness    Complete by:  As directed      Call MD for:  persistant nausea and vomiting    Complete by:  As directed      Call MD for:  severe uncontrolled pain    Complete by:  As directed      Call MD for:  temperature >100.4    Complete by:  As directed      Diet - low sodium heart healthy    Complete by:  As directed      Increase activity slowly    Complete by:  As  directed             Medication List    STOP taking these medications        clindamycin 300 MG capsule  Commonly known as:  CLEOCIN     Olmesartan-Amlodipine-HCTZ 40-5-25 MG Tabs  Commonly known as:  TRIBENZOR     spironolactone 25 MG tablet  Commonly known as:  ALDACTONE     traMADol-acetaminophen 37.5-325 MG tablet  Commonly known as:  ULTRACET     TRIBENZOR 40-10-12.5 MG Tabs  Generic drug:  Olmesartan-Amlodipine-HCTZ      TAKE these medications        albuterol 108 (90 Base) MCG/ACT inhaler  Commonly known as:  PROVENTIL HFA;VENTOLIN HFA  Inhale 2 puffs into the lungs every 6 (six) hours as  needed for wheezing or shortness of breath.     amLODipine 10 MG tablet  Commonly known as:  NORVASC  Take 1 tablet (10 mg total) by mouth daily.     aspirin EC 81 MG tablet  Take 81 mg by mouth daily.     cloNIDine 0.1 MG tablet  Commonly known as:  CATAPRES  Take 2 tablets (0.2 mg total) by mouth 2 (two) times daily.     clopidogrel 75 MG tablet  Commonly known as:  PLAVIX  Take 75 mg by mouth daily.     colchicine 0.6 MG tablet  Commonly known as:  COLCRYS  Take 0.5 tablets (0.3 mg total) by mouth daily.     DEXILANT 60 MG capsule  Generic drug:  dexlansoprazole  Take 60 mg by mouth daily.     doxycycline 100 MG tablet  Commonly known as:  VIBRA-TABS  Take 1 tablet (100 mg total) by mouth 2 (two) times daily.     feeding supplement (ENSURE ENLIVE) Liqd  Take 237 mLs by mouth 2 (two) times daily between meals.     ferrous sulfate 325 (65 FE) MG tablet  Take 325 mg by mouth daily with breakfast.     FUSION PLUS Caps  Take 1 tablet by mouth daily.     hydrALAZINE 25 MG tablet  Commonly known as:  APRESOLINE  Take 1 tablet (25 mg total) by mouth 3 (three) times daily.     levETIRAcetam 500 MG tablet  Commonly known as:  KEPPRA  Take 500 mg by mouth 2 (two) times daily.       Follow-up Information    Follow up with [name], MD. Schedule an  appointment as soon as possible for a visit in 5 days.   Specialty:  Family Medicine   Why:  To be seen with repeat labs (CBC & BMP).   Contact information:   [address] [place] [address] [phone]       Follow up with Nahser, Wonda Cheng, MD. Schedule an appointment as soon as possible for a visit in 10 days.   Specialty:  Cardiology   Contact information:   [place] [id number] [phone]        The results of significant diagnostics from this hospitalization (including imaging, microbiology, ancillary and laboratory) are listed below for reference.    Significant Diagnostic Studies: Ct Abdomen Pelvis Wo Contrast  12/08/2015  CLINICAL DATA:  Nausea and decreased appetite EXAM: CT ABDOMEN AND PELVIS WITHOUT CONTRAST TECHNIQUE: Multidetector CT imaging of the abdomen and pelvis was performed following the standard protocol without IV contrast. COMPARISON:  None. FINDINGS: Lung bases are free of acute infiltrate or sizable effusion. A large hiatal hernia is noted. The gallbladder has been surgically removed. The liver, spleen, adrenal glands and pancreas are within normal limits. The kidneys are well visualized and demonstrates some small cysts. No renal calculi or obstructive changes are noted. The bladder is well distended. Calcified uterine fibroids are seen. No pelvic mass lesion is noted. No acute bony abnormality is seen. A right hip replacement is noted. Degenerative change of the lumbar spine is noted. IMPRESSION: Chronic changes without acute abnormality. Electronically Signed   By: Inez Catalina M.D.   On: 12/08/2015 16:50   Dg Chest 2 View  12/13/2015  CLINICAL DATA:  82 year old female with shortness breath and wheezing today. EXAM: CHEST  2 VIEW COMPARISON:  Chest x-ray a 07/02/2010. FINDINGS:  Lung volumes are normal. No acute consolidative airspace disease. Left lung is clear. No pleural effusions. Mild diffuse peribronchial cuffing.  Mild cardiomegaly. Large hiatal hernia. Upper mediastinal contours are within normal limits. Atherosclerosis in the thoracic aorta. IMPRESSION: 1. Diffuse peribronchial cuffing, suggesting an acute bronchitis. 2. Large hiatal hernia. 3. Mild cardiomegaly. 4. Atherosclerosis. Electronically Signed   By: Vinnie Langton M.D.   On: 12/13/2015 12:03    Microbiology: Recent Results (from the past 240 hour(s))  C difficile quick scan w PCR reflex     Status: None   Collection Time: 12/08/15  4:28 PM  Result Value Ref Range Status   C Diff antigen NEGATIVE NEGATIVE Final   C Diff toxin NEGATIVE NEGATIVE Final   C Diff interpretation Negative for toxigenic C. difficile  Final  Gastrointestinal Panel by PCR , Stool     Status: None   Collection Time: 12/08/15  4:28 PM  Result Value Ref Range Status   Campylobacter species NOT DETECTED NOT DETECTED Final   Plesimonas shigelloides NOT DETECTED NOT DETECTED Final   Salmonella species NOT DETECTED NOT DETECTED Final   Yersinia enterocolitica NOT DETECTED NOT DETECTED Final   Vibrio species NOT DETECTED NOT DETECTED Final   Vibrio cholerae NOT DETECTED NOT DETECTED Final   Enteroaggregative E coli (EAEC) NOT DETECTED NOT DETECTED Final   Enteropathogenic E coli (EPEC) NOT DETECTED NOT DETECTED Final   Enterotoxigenic E coli (ETEC) NOT DETECTED NOT DETECTED Final   Shiga like toxin producing E coli (STEC) NOT DETECTED NOT DETECTED Final   E. coli O157 NOT DETECTED NOT DETECTED Final   Shigella/Enteroinvasive E coli (EIEC) NOT DETECTED NOT DETECTED Final   Cryptosporidium NOT DETECTED NOT DETECTED Final   Cyclospora cayetanensis NOT DETECTED NOT DETECTED Final   Entamoeba histolytica NOT DETECTED NOT DETECTED Final   Giardia lamblia NOT DETECTED NOT DETECTED Final   Adenovirus F40/41 NOT DETECTED NOT DETECTED Final   Astrovirus NOT DETECTED NOT DETECTED Final   Norovirus GI/GII NOT DETECTED NOT DETECTED Final   Rotavirus A NOT DETECTED NOT DETECTED  Final   Sapovirus (I, II, IV, and V) NOT DETECTED NOT DETECTED Final  MRSA PCR Screening     Status: None   Collection Time: 12/08/15  6:30 PM  Result Value Ref Range Status   MRSA by PCR NEGATIVE NEGATIVE Final    Comment:        The GeneXpert MRSA Assay (FDA approved for NASAL specimens only), is one component of a comprehensive MRSA colonization surveillance program. It is not intended to diagnose MRSA infection nor to guide or monitor treatment for MRSA infections.      Labs: Basic Metabolic Panel:  Recent Labs Lab 12/08/15 1908  12/10/15 0815 12/11/15 1653 12/12/15 0513 12/13/15 0530 12/14/15 0455  NA 141  < > 144 137 137 135 137  K 6.1*  < > 3.4* 3.9 3.5 3.8 4.1  CL 117*  < > 118* 112* 112* 103 102  CO2 10*  < > 15* 14* 17* 24 26  GLUCOSE 86  < > 82 115* 106* 114* 111*  BUN 52*  < > 33* 32* 31* 32* 33*  CREATININE 3.72*  < > 2.71* 2.36* 2.03* 2.11* 2.20*  CALCIUM 9.4  < > 8.9 8.1* 8.1* 8.4* 9.0  MG 1.3*  --   --  0.9* 2.2  --   --   PHOS 4.8*  --   --  2.3*  --  2.4*  --   < > =  values in this interval not displayed. Liver Function Tests:  Recent Labs Lab 12/08/15 1426 12/11/15 1653 12/13/15 0530  AST 36  --   --   ALT 19  --   --   ALKPHOS 111  --   --   BILITOT 1.5*  --   --   PROT 7.6  --   --   ALBUMIN 4.7 3.9 2.9*    Recent Labs Lab 12/08/15 1426  LIPASE 34   No results for input(s): AMMONIA in the last 168 hours. CBC:  Recent Labs Lab 12/08/15 1242 12/08/15 1908 12/09/15 0332 12/11/15 1653  WBC 10.1 11.7* 10.0 10.2  NEUTROABS  --  9.9*  --   --   HGB 12.6 10.8* 10.2* 10.8*  HCT 38.0 32.7* 30.3* 32.2*  MCV 87.6 87.0 85.4 85.9  PLT 461* 303 263 215   Cardiac Enzymes:  Recent Labs Lab 12/08/15 1908 12/09/15 0050 12/09/15 0657  TROPONINI 0.11* 0.14* 0.15*   BNP: BNP (last 3 results) No results for input(s): BNP in the last 8760 hours.  ProBNP (last 3 results) No results for input(s): PROBNP in the last 8760  hours.  CBG:  Recent Labs Lab 12/09/15 0752 12/10/15 0738 12/11/15 0819 12/12/15 0818 12/14/15 0725  GLUCAP 85 78 79 98 100*     Signed:  Vernell Leep, MD, FACP, FHM. [facility] Pager [phone]  If 7PM-7AM, please contact night-coverage [email or web address] Password [email or web address] 12/14/2015, 1:55 PM

## 2015-12-14 NOTE — Progress Notes (Signed)
Physical Therapy Treatment Patient Details Name: [name] MRN: [id number] DOB: 06-05-1930 Today's Date: 12/14/2015    History of Present Illness 82 y.o. female, with a history of hypertension, LBBB, paroxysmal atrial fibrillation admitted for hyperkalemia and acute kidney failure    PT Comments    Pt ambulated 200' with RW without loss of balance. DOE noted with walking, pt stated this has been her baseline for a few years.  She was able to perform toileting and hand washing independently. From PT standpoint she is ready to DC home.   Follow Up Recommendations  Home health PT     Equipment Recommendations  None recommended by PT    Recommendations for Other Services       Precautions / Restrictions Precautions Precautions: Fall Restrictions Weight Bearing Restrictions: No    Mobility  Bed Mobility Overal bed mobility: Needs Assistance Bed Mobility: Supine to Sit     Supine to sit: HOB elevated;Modified independent (Device/Increase time)     General bed mobility comments: HOB up 25*  Transfers Overall transfer level: Needs assistance Equipment used: Rolling walker (2 wheeled) Transfers: Sit to/from Stand Sit to Stand: Modified independent (Device/Increase time)         General transfer comment: safe and steady with RW  Ambulation/Gait Ambulation/Gait assistance: Modified independent (Device/Increase time) Ambulation Distance (Feet): 200 Feet   Gait Pattern/deviations: WFL(Within Functional Limits)     General Gait Details: 2/4 dyspnea with walking, pt reports this is baseline; steady with RW, no LOB   Stairs            Wheelchair Mobility    Modified Rankin (Stroke Patients Only)       Balance                                    Cognition Arousal/Alertness: Awake/alert Behavior During Therapy: WFL for tasks assessed/performed Overall Cognitive Status: Within Functional Limits for tasks assessed                      Exercises      General Comments        Pertinent Vitals/Pain Pain Assessment: No/denies pain Pain Score: 0-No pain    Home Living                      Prior Function            PT Goals (current goals can now be found in the care plan section) Acute Rehab PT Goals Patient Stated Goal: to get myself back together PT Goal Formulation: With patient Time For Goal Achievement: 12/17/15 Potential to Achieve Goals: Good Progress towards PT goals: Goals met/education completed, patient discharged from PT    Frequency  Min 3X/week    PT Plan Current plan remains appropriate    Co-evaluation             End of Session Equipment Utilized During Treatment: Gait belt Activity Tolerance: Patient tolerated treatment well Patient left: in chair;with call bell/phone within reach     Time: 2446-2863 PT Time Calculation (min) (ACUTE ONLY): 16 min  Charges:  $Gait Training: 8-22 mins                    G Codes:      Blondell Reveal Kistler 12/14/2015, 2:18 PM [phone]

## 2015-12-14 NOTE — Progress Notes (Signed)
Discharge papers given.  Gave heart failure packet and told pt to record weight everyday and follow up if needed.  Left via daughter in wheelchair. [name]

## 2015-12-15 ENCOUNTER — Telehealth: Payer: Self-pay | Admitting: Cardiovascular Disease

## 2015-12-15 DIAGNOSIS — Z87891 Personal history of nicotine dependence: Secondary | ICD-10-CM | POA: Diagnosis not present

## 2015-12-15 DIAGNOSIS — Z96651 Presence of right artificial knee joint: Secondary | ICD-10-CM | POA: Diagnosis not present

## 2015-12-15 DIAGNOSIS — N289 Disorder of kidney and ureter, unspecified: Secondary | ICD-10-CM | POA: Diagnosis not present

## 2015-12-15 DIAGNOSIS — M150 Primary generalized (osteo)arthritis: Secondary | ICD-10-CM | POA: Diagnosis not present

## 2015-12-15 DIAGNOSIS — R030 Elevated blood-pressure reading, without diagnosis of hypertension: Secondary | ICD-10-CM | POA: Diagnosis not present

## 2015-12-15 DIAGNOSIS — I739 Peripheral vascular disease, unspecified: Secondary | ICD-10-CM | POA: Diagnosis not present

## 2015-12-15 DIAGNOSIS — K219 Gastro-esophageal reflux disease without esophagitis: Secondary | ICD-10-CM | POA: Diagnosis not present

## 2015-12-15 DIAGNOSIS — Z7982 Long term (current) use of aspirin: Secondary | ICD-10-CM | POA: Diagnosis not present

## 2015-12-15 DIAGNOSIS — D649 Anemia, unspecified: Secondary | ICD-10-CM | POA: Diagnosis not present

## 2015-12-15 DIAGNOSIS — Z6837 Body mass index (BMI) 37.0-37.9, adult: Secondary | ICD-10-CM | POA: Diagnosis not present

## 2015-12-15 DIAGNOSIS — G40909 Epilepsy, unspecified, not intractable, without status epilepticus: Secondary | ICD-10-CM | POA: Diagnosis not present

## 2015-12-15 DIAGNOSIS — I10 Essential (primary) hypertension: Secondary | ICD-10-CM | POA: Diagnosis not present

## 2015-12-15 DIAGNOSIS — I480 Paroxysmal atrial fibrillation: Secondary | ICD-10-CM | POA: Diagnosis not present

## 2015-12-15 DIAGNOSIS — J9801 Acute bronchospasm: Secondary | ICD-10-CM | POA: Diagnosis not present

## 2015-12-15 DIAGNOSIS — I2510 Atherosclerotic heart disease of native coronary artery without angina pectoris: Secondary | ICD-10-CM | POA: Diagnosis not present

## 2015-12-15 DIAGNOSIS — Z96652 Presence of left artificial knee joint: Secondary | ICD-10-CM | POA: Diagnosis not present

## 2015-12-15 DIAGNOSIS — K5790 Diverticulosis of intestine, part unspecified, without perforation or abscess without bleeding: Secondary | ICD-10-CM | POA: Diagnosis not present

## 2015-12-15 NOTE — Telephone Encounter (Signed)
New problem   Need home health orders for skilled nursing.

## 2015-12-15 NOTE — Telephone Encounter (Signed)
Spoke with Shaunda with [facility] and advised her that [name] requests PCP write orders for home health.  She verbalized understanding.

## 2015-12-16 ENCOUNTER — Encounter: Payer: Self-pay | Admitting: Cardiology

## 2015-12-16 ENCOUNTER — Ambulatory Visit (INDEPENDENT_AMBULATORY_CARE_PROVIDER_SITE_OTHER): Payer: Medicare Other | Admitting: Cardiology

## 2015-12-16 VITALS — BP 138/70 | HR 82 | Resp 20 | Ht <= 58 in | Wt 182.0 lb

## 2015-12-16 DIAGNOSIS — R0600 Dyspnea, unspecified: Secondary | ICD-10-CM | POA: Diagnosis not present

## 2015-12-16 MED ORDER — HYDRALAZINE HCL 25 MG PO TABS: 25.0000 mg | ORAL_TABLET | Freq: Three times a day (TID) | ORAL | Status: DC

## 2015-12-16 NOTE — Patient Instructions (Addendum)
Medication Instructions:  Your physician recommends that you continue on your current medications as directed. Please refer to the Current Medication list given to you today.  Labwork: NONE  Testing/Procedures: Your physician has requested that you have an echocardiogram. Echocardiography is a painless test that uses sound waves to create images of your heart. It provides your doctor with information about the size and shape of your heart and how well your heart's chambers and valves are working. This procedure takes approximately one hour. There are no restrictions for this procedure.  Follow-Up: Your physician wants you make an appointment to follow-up in: April with [name].     If you need a refill on your cardiac medications before your next appointment, please call your pharmacy.

## 2015-12-16 NOTE — Progress Notes (Signed)
12/16/2015 [name]   03-06-1930  PH:[id number]  Primary Physician [name], MD Primary Cardiologist: [name]   Reason for Visit/CC: [facility] F/u   HPI:  82 y/o female, followed by [name], who presents to clinic today for f/u. She has a chronic LBBB, remote stress testing in 2005 which was normal, HTN, PVD with past renal artery stenosis, PAF (but with no real documentation of this apparently), iron deficiency anemia, seizure disorder and prior subdural hematoma and a past GI bleed. She has had chronic chest pain and palpitations for many years. Echo back in 2010 showed normal LV function, mild LVH with mild hypertrophy and moderate obstruction of the outflow track. She was last seen by [name] on 10/08/15. She was stable from a cardiac perspective and BP was well controlled. She was advised to f/u in 6 months.  She presents to clinic today for post hospital f/u. She was admitted by Internal Medicine on 12/08/15 and discharged on 12/14/15. She initially presented to the [facility] ED with complaints of nausea, vomiting and poor po intake.  In ED, BP was 137/76, HR 104, RR 17, afebrile. Blood work most significant for potassum of 7 which was initially felt to be hemolyzed. However repeat value was again 7 and she was placed for calcium gluconate, insulin, kayexalate, lasix. Also, Cr was 4.55. She was admitted for hyperkalemia and acute on chronic kidney disease/ Hypokalemia/non-anion gap metabolic acidosis, felt likely from  dehydration,GI losses, meds - (ARB) and spironolactone . She improved with treatment. Her ARB and Aldactone were both discontinued. She was also noted to have midly elevated troponins with flat level trend (0.11, 0.15, 0.15) but she denied any chest pain. EKGs showed chronic LBBB but no new changes.  It was felt this was likely demand ischemia from acute renal failure as well as hyperkalemia. She was also treated for presumed acute bronchitis. Day of discharge, her K  was 4.1 and SCr had improved to 2.20.  She is here today with her daughter. She notes significant improvement with resolution of nausea and vomiting. She continues to have a poor appeitite however and she also continues to be SOB, worse with exertion. She has also noticed wheezing. She saw her PCP yesterday and she was prescribed an inhaler, which helps to some degree. She continues to deny chest pain. No LEE. She reports full medication compliance. She has refrained from ARB and Aldactone use as directed. Her BP is well controlled today.    Current Outpatient Prescriptions  Medication Sig Dispense Refill  . albuterol (PROVENTIL HFA;VENTOLIN HFA) 108 (90 Base) MCG/ACT inhaler Inhale 2 puffs into the lungs every 6 (six) hours as needed for wheezing or shortness of breath. 1 Inhaler 0  . amLODipine (NORVASC) 10 MG tablet Take 1 tablet (10 mg total) by mouth daily. 30 tablet 0  . aspirin EC 81 MG tablet Take 81 mg by mouth daily.    . cloNIDine (CATAPRES) 0.1 MG tablet Take 2 tablets (0.2 mg total) by mouth 2 (two) times daily. 60 tablet 0  . clopidogrel (PLAVIX) 75 MG tablet Take 75 mg by mouth daily.     . colchicine (COLCRYS) 0.6 MG tablet Take 0.5 tablets (0.3 mg total) by mouth daily.    Marland Kitchen dexlansoprazole (DEXILANT) 60 MG capsule Take 60 mg by mouth daily.    Marland Kitchen doxycycline (VIBRA-TABS) 100 MG tablet Take 1 tablet (100 mg total) by mouth 2 (two) times daily. 7 tablet 0  . feeding supplement, ENSURE  ENLIVE, (ENSURE ENLIVE) LIQD Take 237 mLs by mouth 2 (two) times daily between meals.    . ferrous sulfate 325 (65 FE) MG tablet Take 325 mg by mouth daily with breakfast.    . hydrALAZINE (APRESOLINE) 25 MG tablet Take 1 tablet (25 mg total) by mouth 3 (three) times daily. 90 tablet 0  . Iron-FA-B Cmp-C-Biot-Probiotic (FUSION PLUS) CAPS Take 1 tablet by mouth daily.   4  . levETIRAcetam (KEPPRA) 500 MG tablet Take 500 mg by mouth 2 (two) times daily.     No current facility-administered medications  for this visit.    Allergies  Allergen Reactions  . Cephalexin Other (See Comments)    REACTION: Questionable, no reaction listed  . Haldol [Haloperidol Decanoate] Other (See Comments)    unknown  . Lorazepam Other (See Comments)    unknown    Social History   Social History  . Marital Status: Widowed    Spouse Name: N/A  . Number of Children: N/A  . Years of Education: N/A   Occupational History  . Not on file.   Social History Main Topics  . Smoking status: Former Research scientist (life sciences)  . Smokeless tobacco: Never Used  . Alcohol Use: No  . Drug Use: No  . Sexual Activity: No   Other Topics Concern  . Not on file   Social History Narrative     Review of Systems: General: negative for chills, fever, night sweats or weight changes.  Cardiovascular: negative for chest pain, dyspnea on exertion, edema, orthopnea, palpitations, paroxysmal nocturnal dyspnea or shortness of breath Dermatological: negative for rash Respiratory: negative for cough or wheezing Urologic: negative for hematuria Abdominal: negative for nausea, vomiting, diarrhea, bright red blood per rectum, melena, or hematemesis Neurologic: negative for visual changes, syncope, or dizziness All other systems reviewed and are otherwise negative except as noted above.    Blood pressure 138/70, pulse 82, resp. rate 20, height 4\' 9"  (1.448 m), weight 182 lb (82.555 kg), SpO2 96 %.  General appearance: alert, cooperative and no distress Neck: no carotid bruit and no JVD Lungs: clear to auscultation bilaterally Heart: regular rate and rhythm, S1, S2 normal, no murmur, click, rub or gallop Extremities: no LEE Pulses: 2+ and symmetric Skin: warm and dry Neurologic: Grossly normal  EKG not performed  ASSESSMENT AND PLAN:   1. Dyspnea: she has known diastolic dysfunction but appears euvolemic on exam. She is being treated by her primary physician for presumed bronchitis, however given an exertional component and fact that  her last echo was in 2014, we will check a 2D echo to reassess LVF, wall motion and valve anatomy (1/6 systolic murmur heard on exam).   2.  Hyperkalemia and acute on chronic kidney disease/ Hypokalemia/non-anion gap metabolic acidosis: treated by IM in the hospital. K day of discharge was 4.1 (7 on admit). Scr had improved to 2.20 (>4 on admit). Nausea and vomiting resolved. ARB and Aldactone discontinued.   3. HTN: ARB and Aldactone were discontinued in hospital due to hyperkalemia. Her BP remains well controlled despite this on clonidine, hydralazine and amlodipine.   PLAN  F/u with [name] in 4 months.   SIMMONS, BRITTAINY PA-C 12/16/2015 9:05 AM

## 2015-12-17 DIAGNOSIS — I2510 Atherosclerotic heart disease of native coronary artery without angina pectoris: Secondary | ICD-10-CM | POA: Diagnosis not present

## 2015-12-17 DIAGNOSIS — I10 Essential (primary) hypertension: Secondary | ICD-10-CM | POA: Diagnosis not present

## 2015-12-17 DIAGNOSIS — D649 Anemia, unspecified: Secondary | ICD-10-CM | POA: Diagnosis not present

## 2015-12-17 DIAGNOSIS — K5790 Diverticulosis of intestine, part unspecified, without perforation or abscess without bleeding: Secondary | ICD-10-CM | POA: Diagnosis not present

## 2015-12-17 DIAGNOSIS — I739 Peripheral vascular disease, unspecified: Secondary | ICD-10-CM | POA: Diagnosis not present

## 2015-12-17 DIAGNOSIS — G40909 Epilepsy, unspecified, not intractable, without status epilepticus: Secondary | ICD-10-CM | POA: Diagnosis not present

## 2015-12-20 DIAGNOSIS — D649 Anemia, unspecified: Secondary | ICD-10-CM | POA: Diagnosis not present

## 2015-12-20 DIAGNOSIS — K5790 Diverticulosis of intestine, part unspecified, without perforation or abscess without bleeding: Secondary | ICD-10-CM | POA: Diagnosis not present

## 2015-12-20 DIAGNOSIS — I10 Essential (primary) hypertension: Secondary | ICD-10-CM | POA: Diagnosis not present

## 2015-12-20 DIAGNOSIS — I739 Peripheral vascular disease, unspecified: Secondary | ICD-10-CM | POA: Diagnosis not present

## 2015-12-20 DIAGNOSIS — G40909 Epilepsy, unspecified, not intractable, without status epilepticus: Secondary | ICD-10-CM | POA: Diagnosis not present

## 2015-12-20 DIAGNOSIS — I2510 Atherosclerotic heart disease of native coronary artery without angina pectoris: Secondary | ICD-10-CM | POA: Diagnosis not present

## 2015-12-21 ENCOUNTER — Ambulatory Visit: Payer: Medicare Other | Admitting: Gastroenterology

## 2015-12-22 DIAGNOSIS — I10 Essential (primary) hypertension: Secondary | ICD-10-CM | POA: Diagnosis not present

## 2015-12-22 DIAGNOSIS — I739 Peripheral vascular disease, unspecified: Secondary | ICD-10-CM | POA: Diagnosis not present

## 2015-12-22 DIAGNOSIS — G40909 Epilepsy, unspecified, not intractable, without status epilepticus: Secondary | ICD-10-CM | POA: Diagnosis not present

## 2015-12-22 DIAGNOSIS — I2510 Atherosclerotic heart disease of native coronary artery without angina pectoris: Secondary | ICD-10-CM | POA: Diagnosis not present

## 2015-12-22 DIAGNOSIS — K5790 Diverticulosis of intestine, part unspecified, without perforation or abscess without bleeding: Secondary | ICD-10-CM | POA: Diagnosis not present

## 2015-12-22 DIAGNOSIS — D649 Anemia, unspecified: Secondary | ICD-10-CM | POA: Diagnosis not present

## 2015-12-23 DIAGNOSIS — I2510 Atherosclerotic heart disease of native coronary artery without angina pectoris: Secondary | ICD-10-CM | POA: Diagnosis not present

## 2015-12-23 DIAGNOSIS — G40909 Epilepsy, unspecified, not intractable, without status epilepticus: Secondary | ICD-10-CM | POA: Diagnosis not present

## 2015-12-23 DIAGNOSIS — I739 Peripheral vascular disease, unspecified: Secondary | ICD-10-CM | POA: Diagnosis not present

## 2015-12-23 DIAGNOSIS — I10 Essential (primary) hypertension: Secondary | ICD-10-CM | POA: Diagnosis not present

## 2015-12-23 DIAGNOSIS — D649 Anemia, unspecified: Secondary | ICD-10-CM | POA: Diagnosis not present

## 2015-12-23 DIAGNOSIS — K5790 Diverticulosis of intestine, part unspecified, without perforation or abscess without bleeding: Secondary | ICD-10-CM | POA: Diagnosis not present

## 2015-12-24 ENCOUNTER — Ambulatory Visit (HOSPITAL_COMMUNITY): Payer: Medicare Other | Attending: Cardiology

## 2015-12-24 ENCOUNTER — Other Ambulatory Visit: Payer: Self-pay

## 2015-12-24 DIAGNOSIS — N189 Chronic kidney disease, unspecified: Secondary | ICD-10-CM | POA: Insufficient documentation

## 2015-12-24 DIAGNOSIS — I129 Hypertensive chronic kidney disease with stage 1 through stage 4 chronic kidney disease, or unspecified chronic kidney disease: Secondary | ICD-10-CM | POA: Diagnosis not present

## 2015-12-24 DIAGNOSIS — R0600 Dyspnea, unspecified: Secondary | ICD-10-CM | POA: Insufficient documentation

## 2015-12-24 DIAGNOSIS — R002 Palpitations: Secondary | ICD-10-CM | POA: Diagnosis not present

## 2015-12-24 DIAGNOSIS — I447 Left bundle-branch block, unspecified: Secondary | ICD-10-CM | POA: Diagnosis not present

## 2015-12-24 DIAGNOSIS — I071 Rheumatic tricuspid insufficiency: Secondary | ICD-10-CM | POA: Insufficient documentation

## 2015-12-24 DIAGNOSIS — I517 Cardiomegaly: Secondary | ICD-10-CM | POA: Diagnosis not present

## 2015-12-24 DIAGNOSIS — I739 Peripheral vascular disease, unspecified: Secondary | ICD-10-CM | POA: Diagnosis not present

## 2015-12-24 DIAGNOSIS — I340 Nonrheumatic mitral (valve) insufficiency: Secondary | ICD-10-CM | POA: Insufficient documentation

## 2015-12-24 DIAGNOSIS — Z87891 Personal history of nicotine dependence: Secondary | ICD-10-CM | POA: Insufficient documentation

## 2015-12-28 DIAGNOSIS — I739 Peripheral vascular disease, unspecified: Secondary | ICD-10-CM | POA: Diagnosis not present

## 2015-12-28 DIAGNOSIS — G40909 Epilepsy, unspecified, not intractable, without status epilepticus: Secondary | ICD-10-CM | POA: Diagnosis not present

## 2015-12-28 DIAGNOSIS — D649 Anemia, unspecified: Secondary | ICD-10-CM | POA: Diagnosis not present

## 2015-12-28 DIAGNOSIS — I2510 Atherosclerotic heart disease of native coronary artery without angina pectoris: Secondary | ICD-10-CM | POA: Diagnosis not present

## 2015-12-28 DIAGNOSIS — K5790 Diverticulosis of intestine, part unspecified, without perforation or abscess without bleeding: Secondary | ICD-10-CM | POA: Diagnosis not present

## 2015-12-28 DIAGNOSIS — I10 Essential (primary) hypertension: Secondary | ICD-10-CM | POA: Diagnosis not present

## 2015-12-29 DIAGNOSIS — K5790 Diverticulosis of intestine, part unspecified, without perforation or abscess without bleeding: Secondary | ICD-10-CM | POA: Diagnosis not present

## 2015-12-29 DIAGNOSIS — I739 Peripheral vascular disease, unspecified: Secondary | ICD-10-CM | POA: Diagnosis not present

## 2015-12-29 DIAGNOSIS — I2510 Atherosclerotic heart disease of native coronary artery without angina pectoris: Secondary | ICD-10-CM | POA: Diagnosis not present

## 2015-12-29 DIAGNOSIS — D649 Anemia, unspecified: Secondary | ICD-10-CM | POA: Diagnosis not present

## 2015-12-29 DIAGNOSIS — I10 Essential (primary) hypertension: Secondary | ICD-10-CM | POA: Diagnosis not present

## 2015-12-29 DIAGNOSIS — G40909 Epilepsy, unspecified, not intractable, without status epilepticus: Secondary | ICD-10-CM | POA: Diagnosis not present

## 2015-12-30 DIAGNOSIS — N289 Disorder of kidney and ureter, unspecified: Secondary | ICD-10-CM | POA: Diagnosis not present

## 2015-12-30 DIAGNOSIS — G40909 Epilepsy, unspecified, not intractable, without status epilepticus: Secondary | ICD-10-CM | POA: Diagnosis not present

## 2015-12-30 DIAGNOSIS — I10 Essential (primary) hypertension: Secondary | ICD-10-CM | POA: Diagnosis not present

## 2015-12-30 DIAGNOSIS — Z6820 Body mass index (BMI) 20.0-20.9, adult: Secondary | ICD-10-CM | POA: Diagnosis not present

## 2015-12-31 DIAGNOSIS — I10 Essential (primary) hypertension: Secondary | ICD-10-CM | POA: Diagnosis not present

## 2015-12-31 DIAGNOSIS — D649 Anemia, unspecified: Secondary | ICD-10-CM | POA: Diagnosis not present

## 2015-12-31 DIAGNOSIS — I739 Peripheral vascular disease, unspecified: Secondary | ICD-10-CM | POA: Diagnosis not present

## 2015-12-31 DIAGNOSIS — G40909 Epilepsy, unspecified, not intractable, without status epilepticus: Secondary | ICD-10-CM | POA: Diagnosis not present

## 2015-12-31 DIAGNOSIS — I2510 Atherosclerotic heart disease of native coronary artery without angina pectoris: Secondary | ICD-10-CM | POA: Diagnosis not present

## 2015-12-31 DIAGNOSIS — K5790 Diverticulosis of intestine, part unspecified, without perforation or abscess without bleeding: Secondary | ICD-10-CM | POA: Diagnosis not present

## 2016-01-04 DIAGNOSIS — D649 Anemia, unspecified: Secondary | ICD-10-CM | POA: Diagnosis not present

## 2016-01-04 DIAGNOSIS — I2510 Atherosclerotic heart disease of native coronary artery without angina pectoris: Secondary | ICD-10-CM | POA: Diagnosis not present

## 2016-01-04 DIAGNOSIS — K5790 Diverticulosis of intestine, part unspecified, without perforation or abscess without bleeding: Secondary | ICD-10-CM | POA: Diagnosis not present

## 2016-01-04 DIAGNOSIS — I739 Peripheral vascular disease, unspecified: Secondary | ICD-10-CM | POA: Diagnosis not present

## 2016-01-04 DIAGNOSIS — G40909 Epilepsy, unspecified, not intractable, without status epilepticus: Secondary | ICD-10-CM | POA: Diagnosis not present

## 2016-01-04 DIAGNOSIS — I10 Essential (primary) hypertension: Secondary | ICD-10-CM | POA: Diagnosis not present

## 2016-01-05 DIAGNOSIS — I10 Essential (primary) hypertension: Secondary | ICD-10-CM | POA: Diagnosis not present

## 2016-01-05 DIAGNOSIS — G40909 Epilepsy, unspecified, not intractable, without status epilepticus: Secondary | ICD-10-CM | POA: Diagnosis not present

## 2016-01-05 DIAGNOSIS — D649 Anemia, unspecified: Secondary | ICD-10-CM | POA: Diagnosis not present

## 2016-01-05 DIAGNOSIS — I2510 Atherosclerotic heart disease of native coronary artery without angina pectoris: Secondary | ICD-10-CM | POA: Diagnosis not present

## 2016-01-05 DIAGNOSIS — I739 Peripheral vascular disease, unspecified: Secondary | ICD-10-CM | POA: Diagnosis not present

## 2016-01-05 DIAGNOSIS — K5790 Diverticulosis of intestine, part unspecified, without perforation or abscess without bleeding: Secondary | ICD-10-CM | POA: Diagnosis not present

## 2016-01-07 DIAGNOSIS — I2510 Atherosclerotic heart disease of native coronary artery without angina pectoris: Secondary | ICD-10-CM | POA: Diagnosis not present

## 2016-01-07 DIAGNOSIS — D649 Anemia, unspecified: Secondary | ICD-10-CM | POA: Diagnosis not present

## 2016-01-07 DIAGNOSIS — G40909 Epilepsy, unspecified, not intractable, without status epilepticus: Secondary | ICD-10-CM | POA: Diagnosis not present

## 2016-01-07 DIAGNOSIS — I10 Essential (primary) hypertension: Secondary | ICD-10-CM | POA: Diagnosis not present

## 2016-01-07 DIAGNOSIS — I739 Peripheral vascular disease, unspecified: Secondary | ICD-10-CM | POA: Diagnosis not present

## 2016-01-07 DIAGNOSIS — K5790 Diverticulosis of intestine, part unspecified, without perforation or abscess without bleeding: Secondary | ICD-10-CM | POA: Diagnosis not present

## 2016-02-02 DIAGNOSIS — N289 Disorder of kidney and ureter, unspecified: Secondary | ICD-10-CM | POA: Diagnosis not present

## 2016-02-02 DIAGNOSIS — I10 Essential (primary) hypertension: Secondary | ICD-10-CM | POA: Diagnosis not present

## 2016-02-02 DIAGNOSIS — K219 Gastro-esophageal reflux disease without esophagitis: Secondary | ICD-10-CM | POA: Diagnosis not present

## 2016-02-02 DIAGNOSIS — Z6834 Body mass index (BMI) 34.0-34.9, adult: Secondary | ICD-10-CM | POA: Diagnosis not present

## 2016-03-08 DIAGNOSIS — N189 Chronic kidney disease, unspecified: Secondary | ICD-10-CM | POA: Diagnosis not present

## 2016-03-08 DIAGNOSIS — G44209 Tension-type headache, unspecified, not intractable: Secondary | ICD-10-CM | POA: Diagnosis not present

## 2016-03-08 DIAGNOSIS — I10 Essential (primary) hypertension: Secondary | ICD-10-CM | POA: Diagnosis not present

## 2016-03-08 DIAGNOSIS — N184 Chronic kidney disease, stage 4 (severe): Secondary | ICD-10-CM | POA: Diagnosis not present

## 2016-03-08 DIAGNOSIS — M1A9XX0 Chronic gout, unspecified, without tophus (tophi): Secondary | ICD-10-CM | POA: Diagnosis not present

## 2016-03-08 DIAGNOSIS — Z6834 Body mass index (BMI) 34.0-34.9, adult: Secondary | ICD-10-CM | POA: Diagnosis not present

## 2016-04-06 ENCOUNTER — Encounter: Payer: Self-pay | Admitting: Cardiovascular Disease

## 2016-04-06 ENCOUNTER — Ambulatory Visit (INDEPENDENT_AMBULATORY_CARE_PROVIDER_SITE_OTHER): Payer: Medicare Other | Admitting: Cardiovascular Disease

## 2016-04-06 VITALS — BP 138/74 | HR 56 | Ht <= 58 in | Wt 171.4 lb

## 2016-04-06 DIAGNOSIS — I2510 Atherosclerotic heart disease of native coronary artery without angina pectoris: Secondary | ICD-10-CM

## 2016-04-06 DIAGNOSIS — I10 Essential (primary) hypertension: Secondary | ICD-10-CM

## 2016-04-06 DIAGNOSIS — E870 Hyperosmolality and hypernatremia: Secondary | ICD-10-CM | POA: Diagnosis not present

## 2016-04-06 NOTE — Patient Instructions (Signed)
Medication Instructions:  Your physician recommends that you continue on your current medications as directed. Please refer to the Current Medication list given to you today.   Labwork: None Ordered   Testing/Procedures: None Ordered   Follow-Up: Your physician wants you to follow-up in: 6 months with Dr. Nahser.  You will receive a reminder letter in the mail two months in advance. If you don't receive a letter, please call our office to schedule the follow-up appointment.   If you need a refill on your cardiac medications before your next appointment, please call your pharmacy.   Thank you for choosing [facility]! Yvonda Fouty, RN [phone]    

## 2016-04-06 NOTE — Progress Notes (Signed)
Cardiology Office Note   Date:  04/06/2016   ID:  [name], DOB Apr 15, 1930, MRN [id number]  PCP:  Geraldo Pitter, MD  Cardiologist:   Vesta Mixer, MD   No chief complaint on file.  1. LBBB 2. Diastolic dysfunction 3. Anemia - received 2 units of PRBC  4. HTN 5, PVD - renal artery stenosis   History of Present Illness: [name] is seen back today for a 10 day check. She has a chronic LBBB, remote stress testing in 2005 which was normal, HTN, PVD with past renal artery stenosis, PAF (but with no real documentation of this apparently), iron deficiency anemia, seizure disorder and prior subdural hematoma and a past GI bleed. She has had chronic chest pain and palpitations for many years. Echo back in 2010 showed normal LV function, mild LVH with mild hypertrophy and moderate obstruction of the outflow track.   I saw her earlier this month. BP was quite high. We have adjusted her medicines and updated her echo. She has severe LVH and is felt to have diastolic dysfunction. Hydralazine was increased. She had not taken any of her medicines prior to her last visit with me.   She comes back today. She is here alone. Doing ok. BP still high here today. She brought her cuff in to check. Her readings in the memory are reviewed and for the most part look good. She feels ok and actually says she feels better. Riding her bike at home.   June 23, 2013:  [name] was last seen by me in 2011. She was seen by Lawson Fiscal in March 2014. She is breathing OK. She continues to have problems with anemia and is breathing better since she had her transfusion. No CP . She has white coat HTN. She does not add salt.   She was accompanied by family.   March 16, 2014:  [name] presents for follow up . BP was elevated here but it was high here. BP readings are ok at home.   Oct. 6. 2015:  April 07, 2015: BP is a bit high. Has her son and his daughter living with her ( getting his house  renovated )  Takes her meds regularly,  Sept. 14, 2016:  BP is very elevated this am  Is under stress - has a nephew who is not expected to live much longer (lung cancer)   Oct. 28, 2016:  Doing well.  We added Aldactone at her last visit.  She is feeling much better.  Has some fatigue with walking up stairs.  BP looks great . Avoiding salt .  April 06, 2016:  [name] is seen back for a visit. Was hospitalized with acute renal failure and hyperkalemia. Aldactone and ARB have been held .  Feeling better.  BP is well controlled.    Past Medical History  Diagnosis Date  . AVM (arteriovenous malformation) of colon   . Diverticulosis   . Esophageal stricture   . Hiatal hernia   . Tubular adenoma of colon 2014  . Iron deficiency anemia   . CAD (coronary artery disease)   . Hypertension   . Seizures (HCC)   . GERD (gastroesophageal reflux disease)   . Ventricular hypertrophy   . Chronic kidney disease   . Anxiety   . Cervical polyp   . Osteoarthritis     Past Surgical History  Procedure Laterality Date  . Laparoscopic appendectomy    . Knee arthroscopy    . Angioplasty  1999  .  Laparoscopic cholecystectomy    . Carpal tunnel release    . Cataract extraction    . Cardiac catheterization    . Coronary angioplasty    . Colonoscopy    . Colonoscopy w/ biopsies    . Joint replacement    . Bilateral total knee replacements      Left Knee-12/29/2003, Right Knee-04/10/2002  . Right total hip athroplasty  01/23/2002  . Right shoulder arthroscopy  12/18/2006  . Major duct excision of left breast  06/01/2004  . Breast surgery    . Inguinal hernia repair  08/08/2006    RIGHT INGUINAL HERNIA REPAIR WITH MESH  . Tonsillectomy       Current Outpatient Prescriptions  Medication Sig Dispense Refill  . albuterol (PROVENTIL HFA;VENTOLIN HFA) 108 (90 Base) MCG/ACT inhaler Inhale 2 puffs into the lungs every 6 (six) hours as needed for wheezing or shortness of breath. 1 Inhaler 0  .  amLODipine (NORVASC) 10 MG tablet Take 1 tablet (10 mg total) by mouth daily. 30 tablet 0  . aspirin EC 81 MG tablet Take 81 mg by mouth daily.    . cloNIDine (CATAPRES) 0.2 MG tablet Take 1 tablet by mouth 2 (two) times daily.  2  . clopidogrel (PLAVIX) 75 MG tablet Take 75 mg by mouth daily.     . colchicine (COLCRYS) 0.6 MG tablet Take 0.5 tablets (0.3 mg total) by mouth daily.    Marland Kitchen dexlansoprazole (DEXILANT) 60 MG capsule Take 60 mg by mouth daily.    Marland Kitchen doxycycline (VIBRA-TABS) 100 MG tablet Take 1 tablet (100 mg total) by mouth 2 (two) times daily. 7 tablet 0  . feeding supplement, ENSURE ENLIVE, (ENSURE ENLIVE) LIQD Take 237 mLs by mouth 2 (two) times daily between meals.    . ferrous sulfate 325 (65 FE) MG tablet Take 325 mg by mouth daily with breakfast.    . hydrALAZINE (APRESOLINE) 25 MG tablet Take 1 tablet (25 mg total) by mouth 3 (three) times daily. 90 tablet 11  . Iron-FA-B Cmp-C-Biot-Probiotic (FUSION PLUS) CAPS Take 1 tablet by mouth daily.   4  . levETIRAcetam (KEPPRA) 500 MG tablet Take 500 mg by mouth 2 (two) times daily.    . Olmesartan-Amlodipine-HCTZ 40-10-12.5 MG TABS Take 1 tablet by mouth daily.  1   No current facility-administered medications for this visit.    Allergies:   Cephalexin; Haldol; and Lorazepam    Social History:  The patient  reports that she has quit smoking. She has never used smokeless tobacco. She reports that she does not drink alcohol or use illicit drugs.   Family History:  The patient's family history includes Cancer in her father and sister; Hypertension in her mother; Other in her brother; Stroke in her mother.    ROS:  Please see the history of present illness.    Review of Systems: Constitutional:  denies fever, chills, diaphoresis, appetite change and fatigue.  HEENT: denies photophobia, eye pain, redness, hearing loss, ear pain, congestion, sore throat, rhinorrhea, sneezing, neck pain, neck stiffness and tinnitus.  Respiratory:  denies SOB, DOE, cough, chest tightness, and wheezing.  Cardiovascular: denies chest pain, palpitations and leg swelling.  Gastrointestinal: denies nausea, vomiting, abdominal pain, diarrhea, constipation, blood in stool.  Genitourinary: denies dysuria, urgency, frequency, hematuria, flank pain and difficulty urinating.  Musculoskeletal: denies  myalgias, back pain, joint swelling, arthralgias and gait problem.   Skin: denies pallor, rash and wound.  Neurological: denies dizziness, seizures, syncope, weakness, light-headedness, numbness and headaches.  Hematological: denies adenopathy, easy bruising, personal or family bleeding history.  Psychiatric/ Behavioral: denies suicidal ideation, mood changes, confusion, nervousness, sleep disturbance and agitation.       All other systems are reviewed and negative.    PHYSICAL EXAM: VS:  BP 138/74 mmHg  Pulse 56  Ht 4\' 9"  (1.448 m)  Wt 171 lb 6.4 oz (77.747 kg)  BMI 37.08 kg/m2 , BMI Body mass index is 37.08 kg/(m^2). GEN: Well nourished, well developed, in no acute distress HEENT: normal Neck: no JVD, carotid bruits, or masses Cardiac: RRR; no murmurs, rubs, or gallops,no edema  Respiratory:  clear to auscultation bilaterally, normal work of breathing GI: soft, nontender, nondistended, + BS MS: no deformity or atrophy Skin: warm and dry, no rash Neuro:  Strength and sensation are intact Psych: normal   EKG:  EKG is ordered today. The ekg ordered today demonstrates :  NSR at 66. LBBB    Recent Labs: 12/08/2015: ALT 19; TSH 0.475 12/11/2015: Hemoglobin 10.8*; Platelets 215 12/12/2015: Magnesium 2.2 12/14/2015: BUN 33*; Creatinine, Ser 2.20*; Potassium 4.1; Sodium 137    Lipid Panel    Component Value Date/Time   CHOL 228* 09/11/2014 0850   TRIG 211.0* 09/11/2014 0850   HDL 41.50 09/11/2014 0850   CHOLHDL 5 09/11/2014 0850   VLDL 42.2* 09/11/2014 0850   LDLCALC  04/25/2010 0459    67        Total Cholesterol/HDL:CHD  Risk Coronary Heart Disease Risk Table                     Men   Women  1/2 Average Risk   3.4   3.3  Average Risk       5.0   4.4  2 X Average Risk   9.6   7.1  3 X Average Risk  23.4   11.0        Use the calculated Patient Ratio above and the CHD Risk Table to determine the patient's CHD Risk.        ATP III CLASSIFICATION (LDL):  <100     mg/dL   Optimal  244-010  mg/dL   Near or Above                    Optimal  130-159  mg/dL   Borderline  272-536  mg/dL   High  >644     mg/dL   Very High   LDLDIRECT 123.2 09/11/2014 0850      Wt Readings from Last 3 Encounters:  04/06/16 171 lb 6.4 oz (77.747 kg)  12/16/15 182 lb (82.555 kg)  12/14/15 172 lb 1.6 oz (78.064 kg)      Other studies Reviewed: Additional studies/ records that were reviewed today include: . Review of the above records demonstrates:    ASSESSMENT AND PLAN:  1. LBBB - her left ventricle systolic function is normal. 2. Diastolic dysfunction - her blood pressure is well controlled. .  Peggye Form encouraged her to avoid eating any extra salt. I've encouraged her to exercise on a regular basis.  3. Anemia -   4. HTN- her blood pressure remains normal. She's on high doses of various medications.    She did well on the Aldactone and ARB for a while  but then was admitted with dehydration and acute renal failure.  Aldactone and the ARB have been held. The amoodipine causes occasional ankle edema .   Non today   5, PVD - renal  artery stenosis - she does not have any abdominal bruits. We'll check her creatinine next visit. She also follows up with Dr. Parke Simmers. 6. LVH -    Current medicines are reviewed at length with the patient today.  The patient does not have concerns regarding medicines.  The following changes have been made:  no change  Labs/ tests ordered today include:   No orders of the defined types were placed in this encounter.    Disposition:   FU with me in 6 months   Her medical doctor will  be checking labs tomorrow     Mikhi Athey, Deloris Ping, MD  04/06/2016 10:10 AM    [facility] [address] [place], [place], [place]  [address] Phone: [phone]; Fax: [phone]

## 2016-04-07 DIAGNOSIS — I10 Essential (primary) hypertension: Secondary | ICD-10-CM | POA: Diagnosis not present

## 2016-04-07 DIAGNOSIS — N186 End stage renal disease: Secondary | ICD-10-CM | POA: Diagnosis not present

## 2016-04-07 DIAGNOSIS — G44209 Tension-type headache, unspecified, not intractable: Secondary | ICD-10-CM | POA: Diagnosis not present

## 2016-04-07 DIAGNOSIS — G40909 Epilepsy, unspecified, not intractable, without status epilepticus: Secondary | ICD-10-CM | POA: Diagnosis not present

## 2016-06-06 DIAGNOSIS — M189 Osteoarthritis of first carpometacarpal joint, unspecified: Secondary | ICD-10-CM | POA: Diagnosis not present

## 2016-06-06 DIAGNOSIS — M1A9XX0 Chronic gout, unspecified, without tophus (tophi): Secondary | ICD-10-CM | POA: Diagnosis not present

## 2016-06-06 DIAGNOSIS — M545 Low back pain: Secondary | ICD-10-CM | POA: Diagnosis not present

## 2016-06-06 DIAGNOSIS — I10 Essential (primary) hypertension: Secondary | ICD-10-CM | POA: Diagnosis not present

## 2016-06-08 DIAGNOSIS — Z6830 Body mass index (BMI) 30.0-30.9, adult: Secondary | ICD-10-CM | POA: Diagnosis not present

## 2016-06-08 DIAGNOSIS — M549 Dorsalgia, unspecified: Secondary | ICD-10-CM | POA: Diagnosis not present

## 2016-06-08 DIAGNOSIS — Z9181 History of falling: Secondary | ICD-10-CM | POA: Diagnosis not present

## 2016-06-08 DIAGNOSIS — R269 Unspecified abnormalities of gait and mobility: Secondary | ICD-10-CM | POA: Diagnosis not present

## 2016-06-08 DIAGNOSIS — Z7902 Long term (current) use of antithrombotics/antiplatelets: Secondary | ICD-10-CM | POA: Diagnosis not present

## 2016-06-12 DIAGNOSIS — Z6830 Body mass index (BMI) 30.0-30.9, adult: Secondary | ICD-10-CM | POA: Diagnosis not present

## 2016-06-12 DIAGNOSIS — Z9181 History of falling: Secondary | ICD-10-CM | POA: Diagnosis not present

## 2016-06-12 DIAGNOSIS — M549 Dorsalgia, unspecified: Secondary | ICD-10-CM | POA: Diagnosis not present

## 2016-06-12 DIAGNOSIS — Z7902 Long term (current) use of antithrombotics/antiplatelets: Secondary | ICD-10-CM | POA: Diagnosis not present

## 2016-06-12 DIAGNOSIS — R269 Unspecified abnormalities of gait and mobility: Secondary | ICD-10-CM | POA: Diagnosis not present

## 2016-06-15 DIAGNOSIS — Z9181 History of falling: Secondary | ICD-10-CM | POA: Diagnosis not present

## 2016-06-15 DIAGNOSIS — M549 Dorsalgia, unspecified: Secondary | ICD-10-CM | POA: Diagnosis not present

## 2016-06-15 DIAGNOSIS — Z7902 Long term (current) use of antithrombotics/antiplatelets: Secondary | ICD-10-CM | POA: Diagnosis not present

## 2016-06-15 DIAGNOSIS — Z6830 Body mass index (BMI) 30.0-30.9, adult: Secondary | ICD-10-CM | POA: Diagnosis not present

## 2016-06-15 DIAGNOSIS — R269 Unspecified abnormalities of gait and mobility: Secondary | ICD-10-CM | POA: Diagnosis not present

## 2016-06-20 DIAGNOSIS — M549 Dorsalgia, unspecified: Secondary | ICD-10-CM | POA: Diagnosis not present

## 2016-06-20 DIAGNOSIS — R269 Unspecified abnormalities of gait and mobility: Secondary | ICD-10-CM | POA: Diagnosis not present

## 2016-06-20 DIAGNOSIS — Z9181 History of falling: Secondary | ICD-10-CM | POA: Diagnosis not present

## 2016-06-20 DIAGNOSIS — Z7902 Long term (current) use of antithrombotics/antiplatelets: Secondary | ICD-10-CM | POA: Diagnosis not present

## 2016-06-20 DIAGNOSIS — Z6830 Body mass index (BMI) 30.0-30.9, adult: Secondary | ICD-10-CM | POA: Diagnosis not present

## 2016-06-22 DIAGNOSIS — R269 Unspecified abnormalities of gait and mobility: Secondary | ICD-10-CM | POA: Diagnosis not present

## 2016-06-22 DIAGNOSIS — M549 Dorsalgia, unspecified: Secondary | ICD-10-CM | POA: Diagnosis not present

## 2016-06-22 DIAGNOSIS — Z7902 Long term (current) use of antithrombotics/antiplatelets: Secondary | ICD-10-CM | POA: Diagnosis not present

## 2016-06-22 DIAGNOSIS — Z9181 History of falling: Secondary | ICD-10-CM | POA: Diagnosis not present

## 2016-06-22 DIAGNOSIS — Z6830 Body mass index (BMI) 30.0-30.9, adult: Secondary | ICD-10-CM | POA: Diagnosis not present

## 2016-06-26 DIAGNOSIS — Z6830 Body mass index (BMI) 30.0-30.9, adult: Secondary | ICD-10-CM | POA: Diagnosis not present

## 2016-06-26 DIAGNOSIS — Z9181 History of falling: Secondary | ICD-10-CM | POA: Diagnosis not present

## 2016-06-26 DIAGNOSIS — M549 Dorsalgia, unspecified: Secondary | ICD-10-CM | POA: Diagnosis not present

## 2016-06-26 DIAGNOSIS — Z7902 Long term (current) use of antithrombotics/antiplatelets: Secondary | ICD-10-CM | POA: Diagnosis not present

## 2016-06-26 DIAGNOSIS — R269 Unspecified abnormalities of gait and mobility: Secondary | ICD-10-CM | POA: Diagnosis not present

## 2016-06-27 DIAGNOSIS — Z961 Presence of intraocular lens: Secondary | ICD-10-CM | POA: Diagnosis not present

## 2016-06-27 DIAGNOSIS — H04123 Dry eye syndrome of bilateral lacrimal glands: Secondary | ICD-10-CM | POA: Diagnosis not present

## 2016-06-28 DIAGNOSIS — Z6830 Body mass index (BMI) 30.0-30.9, adult: Secondary | ICD-10-CM | POA: Diagnosis not present

## 2016-06-28 DIAGNOSIS — Z7902 Long term (current) use of antithrombotics/antiplatelets: Secondary | ICD-10-CM | POA: Diagnosis not present

## 2016-06-28 DIAGNOSIS — Z9181 History of falling: Secondary | ICD-10-CM | POA: Diagnosis not present

## 2016-06-28 DIAGNOSIS — M549 Dorsalgia, unspecified: Secondary | ICD-10-CM | POA: Diagnosis not present

## 2016-06-28 DIAGNOSIS — R269 Unspecified abnormalities of gait and mobility: Secondary | ICD-10-CM | POA: Diagnosis not present

## 2016-07-03 DIAGNOSIS — Z9181 History of falling: Secondary | ICD-10-CM | POA: Diagnosis not present

## 2016-07-03 DIAGNOSIS — M549 Dorsalgia, unspecified: Secondary | ICD-10-CM | POA: Diagnosis not present

## 2016-07-03 DIAGNOSIS — Z7902 Long term (current) use of antithrombotics/antiplatelets: Secondary | ICD-10-CM | POA: Diagnosis not present

## 2016-07-03 DIAGNOSIS — Z6830 Body mass index (BMI) 30.0-30.9, adult: Secondary | ICD-10-CM | POA: Diagnosis not present

## 2016-07-03 DIAGNOSIS — R269 Unspecified abnormalities of gait and mobility: Secondary | ICD-10-CM | POA: Diagnosis not present

## 2016-07-05 DIAGNOSIS — Z6830 Body mass index (BMI) 30.0-30.9, adult: Secondary | ICD-10-CM | POA: Diagnosis not present

## 2016-07-05 DIAGNOSIS — Z9181 History of falling: Secondary | ICD-10-CM | POA: Diagnosis not present

## 2016-07-05 DIAGNOSIS — Z7902 Long term (current) use of antithrombotics/antiplatelets: Secondary | ICD-10-CM | POA: Diagnosis not present

## 2016-07-05 DIAGNOSIS — R269 Unspecified abnormalities of gait and mobility: Secondary | ICD-10-CM | POA: Diagnosis not present

## 2016-07-05 DIAGNOSIS — M549 Dorsalgia, unspecified: Secondary | ICD-10-CM | POA: Diagnosis not present

## 2016-07-11 DIAGNOSIS — M1A9XX0 Chronic gout, unspecified, without tophus (tophi): Secondary | ICD-10-CM | POA: Diagnosis not present

## 2016-07-11 DIAGNOSIS — I10 Essential (primary) hypertension: Secondary | ICD-10-CM | POA: Diagnosis not present

## 2016-07-11 DIAGNOSIS — N289 Disorder of kidney and ureter, unspecified: Secondary | ICD-10-CM | POA: Diagnosis not present

## 2016-08-13 ENCOUNTER — Other Ambulatory Visit: Payer: Self-pay | Admitting: Cardiovascular Disease

## 2016-08-17 ENCOUNTER — Other Ambulatory Visit: Payer: Self-pay | Admitting: Cardiovascular Disease

## 2016-08-17 NOTE — Telephone Encounter (Signed)
She should not be taking the spironolactone.  Thank you.

## 2016-08-17 NOTE — Telephone Encounter (Signed)
This medication was stopped at 12/14/15 hospital discharge. She is now asking for a ninety day refill. Please advise as I do not see where she was instructed to resume taking it. Thanks, MI

## 2016-09-20 ENCOUNTER — Encounter: Payer: Self-pay | Admitting: Cardiovascular Disease

## 2016-09-27 ENCOUNTER — Other Ambulatory Visit: Payer: Self-pay | Admitting: Family Medicine

## 2016-09-27 DIAGNOSIS — Z1231 Encounter for screening mammogram for malignant neoplasm of breast: Secondary | ICD-10-CM

## 2016-10-02 DIAGNOSIS — L303 Infective dermatitis: Secondary | ICD-10-CM | POA: Diagnosis not present

## 2016-10-03 ENCOUNTER — Encounter: Payer: Self-pay | Admitting: Cardiovascular Disease

## 2016-10-03 ENCOUNTER — Ambulatory Visit (INDEPENDENT_AMBULATORY_CARE_PROVIDER_SITE_OTHER): Payer: Medicare Other | Admitting: Cardiovascular Disease

## 2016-10-03 VITALS — BP 160/70 | HR 58 | Ht 60.0 in | Wt 160.0 lb

## 2016-10-03 DIAGNOSIS — I10 Essential (primary) hypertension: Secondary | ICD-10-CM | POA: Diagnosis not present

## 2016-10-03 DIAGNOSIS — R001 Bradycardia, unspecified: Secondary | ICD-10-CM | POA: Diagnosis not present

## 2016-10-03 DIAGNOSIS — Z23 Encounter for immunization: Secondary | ICD-10-CM

## 2016-10-03 DIAGNOSIS — I2510 Atherosclerotic heart disease of native coronary artery without angina pectoris: Secondary | ICD-10-CM | POA: Diagnosis not present

## 2016-10-03 LAB — BASIC METABOLIC PANEL
BUN: 52 mg/dL — AB (ref 7–25)
CHLORIDE: 106 mmol/L (ref 98–110)
CO2: 20 mmol/L (ref 20–31)
Calcium: 9.9 mg/dL (ref 8.6–10.4)
Creat: 3.23 mg/dL — ABNORMAL HIGH (ref 0.60–0.88)
Glucose, Bld: 93 mg/dL (ref 65–99)
POTASSIUM: 4.2 mmol/L (ref 3.5–5.3)
Sodium: 137 mmol/L (ref 135–146)

## 2016-10-03 LAB — TSH: TSH: 1.83 m[IU]/L

## 2016-10-03 MED ORDER — DOXAZOSIN MESYLATE 8 MG PO TABS: 8.0000 mg | ORAL_TABLET | Freq: Every day | ORAL | 11 refills | Status: DC

## 2016-10-03 NOTE — Progress Notes (Signed)
Cardiology Office Note   Date:  10/03/2016   ID:  [name], DOB 1930-04-25, MRN [id number]  PCP:  [name], MD  Cardiologist:   Mertie Moores, MD   Chief Complaint  Patient presents with  . Coronary Artery Disease    HR goes between elevated & decreased & SOB, per pt   1. LBBB 2. Diastolic dysfunction 3. Anemia - received 2 units of PRBC  4. HTN 5, PVD - renal artery stenosis 6. CKD    Previous notes  [name] is seen back today for a 10 day check. She has a chronic LBBB, remote stress testing in 2005 which was normal, HTN, PVD with past renal artery stenosis, PAF (but with no real documentation of this apparently), iron deficiency anemia, seizure disorder and prior subdural hematoma and a past GI bleed. She has had chronic chest pain and palpitations for many years. Echo back in 2010 showed normal LV function, mild LVH with mild hypertrophy and moderate obstruction of the outflow track.   I saw her earlier this month. BP was quite high. We have adjusted her medicines and updated her echo. She has severe LVH and is felt to have diastolic dysfunction. Hydralazine was increased. She had not taken any of her medicines prior to her last visit with me.   She comes back today. She is here alone. Doing ok. BP still high here today. She brought her cuff in to check. Her readings in the memory are reviewed and for the most part look good. She feels ok and actually says she feels better. Riding her bike at home.   June 23, 2013:  [name] was last seen by me in 2011. She was seen by [name] in March 2014. She is breathing OK. She continues to have problems with anemia and is breathing better since she had her transfusion. No CP . She has white coat HTN. She does not add salt.   She was accompanied by family.   March 16, 2014:  [name] presents for follow up . BP was elevated here but it was high here. BP readings are ok at home.   Oct. 6. 2015:  April 07, 2015: BP is a bit high. Has her son and his daughter living with her ( getting his house renovated )  Takes her meds regularly,  Sept. 14, 2016:  BP is very elevated this am  Is under stress - has a nephew who is not expected to live much longer (lung cancer)   Oct. 28, 2016:  Doing well.  We added Aldactone at her last visit.  She is feeling much better.  Has some fatigue with walking up stairs.  BP looks great . Avoiding salt .  April 06, 2016:  [name] is seen back for a visit. Was hospitalized with acute renal failure and hyperkalemia. Aldactone and ARB have been held .  Feeling better.  BP is well controlled.   Oct. 24. 2017  BP is up this am  Taking her meds. , has been well.  She is concerned about her HR going up and down . No syncope     Past Medical History:  Diagnosis Date  . Anxiety   . AVM (arteriovenous malformation) of colon   . CAD (coronary artery disease)   . Cervical polyp   . Chronic kidney disease   . Diverticulosis   . Esophageal stricture   . GERD (gastroesophageal reflux disease)   . Hiatal hernia   . Hypertension   .  Iron deficiency anemia   . Osteoarthritis   . Seizures ([place])   . Tubular adenoma of colon 2014  . Ventricular hypertrophy     Past Surgical History:  Procedure Laterality Date  . ANGIOPLASTY  1999  . Bilateral Total Knee Replacements     Left Knee-12/29/2003, Right Knee-04/10/2002  . BREAST SURGERY    . CARDIAC CATHETERIZATION    . CARPAL TUNNEL RELEASE    . CATARACT EXTRACTION    . COLONOSCOPY    . COLONOSCOPY W/ BIOPSIES    . CORONARY ANGIOPLASTY    . INGUINAL HERNIA REPAIR  08/08/2006   RIGHT INGUINAL HERNIA REPAIR WITH MESH  . JOINT REPLACEMENT    . KNEE ARTHROSCOPY    . LAPAROSCOPIC APPENDECTOMY    . LAPAROSCOPIC CHOLECYSTECTOMY    . MAJOR DUCT EXCISION OF LEFT BREAST  06/01/2004  . RIGHT SHOULDER ARTHROSCOPY  12/18/2006  . Right Total Hip Athroplasty  01/23/2002  . TONSILLECTOMY       Current Outpatient  Prescriptions  Medication Sig Dispense Refill  . aspirin EC 81 MG tablet Take 81 mg by mouth daily.    . cloNIDine (CATAPRES) 0.2 MG tablet Take 1 tablet by mouth 2 (two) times daily.  2  . clopidogrel (PLAVIX) 75 MG tablet Take 75 mg by mouth daily.     . colchicine (COLCRYS) 0.6 MG tablet Take 0.5 tablets (0.3 mg total) by mouth daily.    . hydrALAZINE (APRESOLINE) 25 MG tablet Take 1 tablet (25 mg total) by mouth 3 (three) times daily. 90 tablet 11  . levETIRAcetam (KEPPRA) 500 MG tablet Take 500 mg by mouth 2 (two) times daily.    . Olmesartan-Amlodipine-HCTZ 40-10-12.5 MG TABS Take 1 tablet by mouth daily.  1  . triamcinolone cream (KENALOG) 0.5 % Apply 1 application topically daily.     No current facility-administered medications for this visit.     Allergies:   Cephalexin; Haldol [haloperidol decanoate]; and Lorazepam    Social History:  The patient  reports that she has quit smoking. She has never used smokeless tobacco. She reports that she does not drink alcohol or use drugs.   Family History:  The patient's family history includes Cancer in her father and sister; Hypertension in her mother; Other in her brother; Stroke in her mother.    ROS:  Please see the history of present illness.    Review of Systems: Constitutional:  denies fever, chills, diaphoresis, appetite change and fatigue.  HEENT: denies photophobia, eye pain, redness, hearing loss, ear pain, congestion, sore throat, rhinorrhea, sneezing, neck pain, neck stiffness and tinnitus.  Respiratory: denies SOB, DOE, cough, chest tightness, and wheezing.  Cardiovascular: denies chest pain, palpitations and leg swelling.  Gastrointestinal: denies nausea, vomiting, abdominal pain, diarrhea, constipation, blood in stool.  Genitourinary: denies dysuria, urgency, frequency, hematuria, flank pain and difficulty urinating.  Musculoskeletal: denies  myalgias, back pain, joint swelling, arthralgias and gait problem.   Skin:  denies pallor, rash and wound.  Neurological: denies dizziness, seizures, syncope, weakness, light-headedness, numbness and headaches.   Hematological: denies adenopathy, easy bruising, personal or family bleeding history.  Psychiatric/ Behavioral: denies suicidal ideation, mood changes, confusion, nervousness, sleep disturbance and agitation.       All other systems are reviewed and negative.    PHYSICAL EXAM: VS:  BP (!) 160/70   Pulse (!) 58   Ht 5' (1.524 m)   Wt 160 lb (72.6 kg)   SpO2 97%   BMI 31.25 kg/m  ,  BMI Body mass index is 31.25 kg/m. GEN: Well nourished, well developed, in no acute distress  HEENT: normal  Neck: no JVD, carotid bruits, or masses Cardiac: RRR; no murmurs, rubs, or gallops,no edema  Respiratory:  clear to auscultation bilaterally, normal work of breathing GI: soft, nontender, nondistended, + BS MS: no deformity or atrophy  Skin: warm and dry, no rash Neuro:  Strength and sensation are intact Psych: normal   EKG:  EKG is ordered today. The ekg ordered today demonstrates :  Sinus brady  at 51.   Recent Labs: 12/08/2015: ALT 19; TSH 0.475 12/11/2015: Hemoglobin 10.8; Platelets 215 12/12/2015: Magnesium 2.2 12/14/2015: BUN 33; Creatinine, Ser 2.20; Potassium 4.1; Sodium 137    Lipid Panel    Component Value Date/Time   CHOL 228 (H) 09/11/2014 0850   TRIG 211.0 (H) 09/11/2014 0850   HDL 41.50 09/11/2014 0850   CHOLHDL 5 09/11/2014 0850   VLDL 42.2 (H) 09/11/2014 0850   LDLCALC  04/25/2010 0459    67        Total Cholesterol/HDL:CHD Risk Coronary Heart Disease Risk Table                     Men   Women  1/2 Average Risk   3.4   3.3  Average Risk       5.0   4.4  2 X Average Risk   9.6   7.1  3 X Average Risk  23.4   11.0        Use the calculated Patient Ratio above and the CHD Risk Table to determine the patient's CHD Risk.        ATP III CLASSIFICATION (LDL):  <100     mg/dL   Optimal  100-129  mg/dL   Near or Above                     Optimal  130-159  mg/dL   Borderline  160-189  mg/dL   High  >190     mg/dL   Very High   LDLDIRECT 123.2 09/11/2014 0850      Wt Readings from Last 3 Encounters:  10/03/16 160 lb (72.6 kg)  04/06/16 171 lb 6.4 oz (77.7 kg)  12/16/15 182 lb (82.6 kg)      Other studies Reviewed: Additional studies/ records that were reviewed today include: . Review of the above records demonstrates:    ASSESSMENT AND PLAN:  1.  Sinus bradycardia. Her heart rate is 51 today. She comments that her heart rate runs low at home. She has not had any episodes of syncope. I would like to see if we can allow her heart rate to increase by stopping the clonidine. We will taper the clonidine down over the next 4 days. We will start her on Cardura 8 mg at night.  She will return to see our PA in 1 month for follow-up visit and EKG. We'll check a TSH today and a basic medical profile.  1. LBBB - her left ventricle systolic function is normal. 2. Diastolic dysfunction -  Need to continue to work on her BP   3. Anemia -   4. HTN- her blood pressure remains normal. She's on high doses of various medications.    She did well on the Aldactone and ARB for a while  but then was admitted with dehydration and acute renal failure.   Her labs returned later in the day. Her creatinine is up  to 3.2. We'll need to start the ARB. We will continue the amlodipine. Will also stop the HCTZ and start her on Lasix 80 mg a day.  She needs to see a nephrologist.    5, PVD - renal artery stenosis - she carries the diagnosis of renal artery stenosis but I cannot find any imaging studies to verify this. she does not have any abdominal bruits.   We'll send her for an abdominal duplex scan to look for renal artery stenosis.  6. LVH -  7. 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    Current medicines are reviewed at length with the patient today.  The patient does not have concerns regarding medicines.  The following changes have been made:  no  change  Labs/ tests ordered today include:   Orders Placed This Encounter  Procedures  . Flu Vaccine QUAD 36+ mos IM    Disposition:   FU with me in 6 months       Mertie Moores, MD  10/03/2016 10:20 AM    [facility] [address], [place], [place]  [id number] Phone: [phone]; Fax: [phone]

## 2016-10-03 NOTE — Patient Instructions (Addendum)
Medication Instructions:  STOP Clonidine (Catapres) START Cardura (Doxazosin) 8 mg once at bedtime   Labwork: TODAY - basic metabolic panel, TSH   Testing/Procedures: None Ordered   Follow-Up: Your physician recommends that you schedule a follow-up appointment in: 1 month with APP with EKG   If you need a refill on your cardiac medications before your next appointment, please call your pharmacy.   Thank you for choosing [facility]! Christen Bame, RN [phone]

## 2016-10-04 ENCOUNTER — Telehealth: Payer: Self-pay | Admitting: Nurse Practitioner

## 2016-10-04 DIAGNOSIS — I701 Atherosclerosis of renal artery: Secondary | ICD-10-CM

## 2016-10-04 DIAGNOSIS — I10 Essential (primary) hypertension: Secondary | ICD-10-CM

## 2016-10-04 MED ORDER — FUROSEMIDE 80 MG PO TABS: 80.0000 mg | ORAL_TABLET | Freq: Every day | ORAL | 11 refills | Status: DC

## 2016-10-04 MED ORDER — HYDRALAZINE HCL 50 MG PO TABS: 50.0000 mg | ORAL_TABLET | Freq: Three times a day (TID) | ORAL | 11 refills | Status: DC

## 2016-10-04 MED ORDER — AMLODIPINE BESYLATE 10 MG PO TABS: 10.0000 mg | ORAL_TABLET | Freq: Every day | ORAL | 11 refills | Status: DC

## 2016-10-04 NOTE — Telephone Encounter (Signed)
-----   Message from Thayer Headings, MD sent at 10/03/2016  5:22 PM EDT ----- Her creatinine is significantly higher than it was 9 months ago. Her creatinine is now 3.23. DC the Olmesartan-Amlodipine-HCTZ 40-10-12.5 MG TABS, Start Amlodipine 10 mg a day Lasix 80 mg a day Increase hydralazine to 50 mg TID ( up from 25 TID)  She needs to see a nephrologist.  Repeat bmp in  4 weeks

## 2016-10-04 NOTE — Telephone Encounter (Signed)
Reviewed lab results and medication changes with patient.  She wrote down changes and repeated them back to me.  She is aware someone from our office will call her to schedule her renal artery ultrasound. She has a follow-up appointment with Richardson Dopp, PA on 11/27. I advised she will need repeat bmet at that appointment.  She states she thought Dr. Criss Rosales, her PCP, has referred her to a nephrologist but she has never received a call about an appointment.  I am forwarding labs to Dr. Criss Rosales because she has an upcoming appointment with her.  I advised patient to call back with questions or concerns prior to appointment.  She thanked me for the call.

## 2016-10-06 ENCOUNTER — Emergency Department (HOSPITAL_COMMUNITY): Payer: Medicare Other

## 2016-10-06 ENCOUNTER — Inpatient Hospital Stay (HOSPITAL_COMMUNITY)
Admission: EM | Admit: 2016-10-06 | Discharge: 2016-10-09 | DRG: 683 | Disposition: A | Discharge status: Home / Self Care | Payer: Medicare Other | Attending: Family Medicine | Admitting: Family Medicine

## 2016-10-06 ENCOUNTER — Encounter (HOSPITAL_COMMUNITY): Payer: Self-pay | Admitting: *Deleted

## 2016-10-06 DIAGNOSIS — R0602 Shortness of breath: Secondary | ICD-10-CM | POA: Diagnosis not present

## 2016-10-06 DIAGNOSIS — G40909 Epilepsy, unspecified, not intractable, without status epilepticus: Secondary | ICD-10-CM

## 2016-10-06 DIAGNOSIS — Z9861 Coronary angioplasty status: Secondary | ICD-10-CM

## 2016-10-06 DIAGNOSIS — E872 Acidosis, unspecified: Secondary | ICD-10-CM | POA: Diagnosis present

## 2016-10-06 DIAGNOSIS — I130 Hypertensive heart and chronic kidney disease with heart failure and stage 1 through stage 4 chronic kidney disease, or unspecified chronic kidney disease: Secondary | ICD-10-CM | POA: Diagnosis present

## 2016-10-06 DIAGNOSIS — I447 Left bundle-branch block, unspecified: Secondary | ICD-10-CM | POA: Diagnosis present

## 2016-10-06 DIAGNOSIS — R531 Weakness: Secondary | ICD-10-CM | POA: Diagnosis not present

## 2016-10-06 DIAGNOSIS — Z87891 Personal history of nicotine dependence: Secondary | ICD-10-CM

## 2016-10-06 DIAGNOSIS — E860 Dehydration: Secondary | ICD-10-CM | POA: Diagnosis present

## 2016-10-06 DIAGNOSIS — N179 Acute kidney failure, unspecified: Secondary | ICD-10-CM | POA: Diagnosis not present

## 2016-10-06 DIAGNOSIS — D509 Iron deficiency anemia, unspecified: Secondary | ICD-10-CM | POA: Diagnosis present

## 2016-10-06 DIAGNOSIS — I5032 Chronic diastolic (congestive) heart failure: Secondary | ICD-10-CM | POA: Diagnosis present

## 2016-10-06 DIAGNOSIS — N189 Chronic kidney disease, unspecified: Secondary | ICD-10-CM

## 2016-10-06 DIAGNOSIS — Z8249 Family history of ischemic heart disease and other diseases of the circulatory system: Secondary | ICD-10-CM

## 2016-10-06 DIAGNOSIS — N184 Chronic kidney disease, stage 4 (severe): Secondary | ICD-10-CM | POA: Diagnosis present

## 2016-10-06 DIAGNOSIS — R112 Nausea with vomiting, unspecified: Secondary | ICD-10-CM | POA: Diagnosis present

## 2016-10-06 DIAGNOSIS — I2510 Atherosclerotic heart disease of native coronary artery without angina pectoris: Secondary | ICD-10-CM | POA: Diagnosis present

## 2016-10-06 DIAGNOSIS — I480 Paroxysmal atrial fibrillation: Secondary | ICD-10-CM | POA: Diagnosis not present

## 2016-10-06 DIAGNOSIS — Z7902 Long term (current) use of antithrombotics/antiplatelets: Secondary | ICD-10-CM

## 2016-10-06 DIAGNOSIS — Z96653 Presence of artificial knee joint, bilateral: Secondary | ICD-10-CM | POA: Diagnosis present

## 2016-10-06 DIAGNOSIS — E871 Hypo-osmolality and hyponatremia: Secondary | ICD-10-CM | POA: Diagnosis present

## 2016-10-06 DIAGNOSIS — I10 Essential (primary) hypertension: Secondary | ICD-10-CM | POA: Diagnosis present

## 2016-10-06 DIAGNOSIS — Z7982 Long term (current) use of aspirin: Secondary | ICD-10-CM

## 2016-10-06 DIAGNOSIS — I517 Cardiomegaly: Secondary | ICD-10-CM | POA: Diagnosis not present

## 2016-10-06 DIAGNOSIS — Z823 Family history of stroke: Secondary | ICD-10-CM

## 2016-10-06 DIAGNOSIS — K219 Gastro-esophageal reflux disease without esophagitis: Secondary | ICD-10-CM | POA: Diagnosis present

## 2016-10-06 DIAGNOSIS — D631 Anemia in chronic kidney disease: Secondary | ICD-10-CM | POA: Diagnosis present

## 2016-10-06 LAB — I-STAT TROPONIN, ED
Troponin i, poc: 0.01 ng/mL (ref 0.00–0.08)
Troponin i, poc: 0.03 ng/mL (ref 0.00–0.08)

## 2016-10-06 LAB — BASIC METABOLIC PANEL
Anion gap: 14 (ref 5–15)
BUN: 53 mg/dL — ABNORMAL HIGH (ref 6–20)
CHLORIDE: 101 mmol/L (ref 101–111)
CO2: 16 mmol/L — AB (ref 22–32)
CREATININE: 3.66 mg/dL — AB (ref 0.44–1.00)
Calcium: 9.9 mg/dL (ref 8.9–10.3)
GFR calc non Af Amer: 10 mL/min — ABNORMAL LOW (ref >60)
GFR, EST AFRICAN AMERICAN: 12 mL/min — AB (ref >60)
GLUCOSE: 137 mg/dL — AB (ref 65–99)
Potassium: 4.6 mmol/L (ref 3.5–5.1)
Sodium: 131 mmol/L — ABNORMAL LOW (ref 135–145)

## 2016-10-06 LAB — I-STAT CHEM 8, ED
BUN: 60 mg/dL — AB (ref 6–20)
CHLORIDE: 103 mmol/L (ref 101–111)
CREATININE: 3.9 mg/dL — AB (ref 0.44–1.00)
Calcium, Ion: 1.13 mmol/L — ABNORMAL LOW (ref 1.15–1.40)
Glucose, Bld: 134 mg/dL — ABNORMAL HIGH (ref 65–99)
HEMATOCRIT: 30 % — AB (ref 36.0–46.0)
Hemoglobin: 10.2 g/dL — ABNORMAL LOW (ref 12.0–15.0)
POTASSIUM: 4.6 mmol/L (ref 3.5–5.1)
SODIUM: 132 mmol/L — AB (ref 135–145)
TCO2: 19 mmol/L (ref 0–100)

## 2016-10-06 LAB — CBC WITH DIFFERENTIAL/PLATELET
BASOS PCT: 0 %
Basophils Absolute: 0 10*3/uL (ref 0.0–0.1)
Eosinophils Absolute: 0 10*3/uL (ref 0.0–0.7)
Eosinophils Relative: 1 %
HEMATOCRIT: 28.1 % — AB (ref 36.0–46.0)
HEMOGLOBIN: 9.2 g/dL — AB (ref 12.0–15.0)
LYMPHS ABS: 1.6 10*3/uL (ref 0.7–4.0)
LYMPHS PCT: 20 %
MCH: 26.3 pg (ref 26.0–34.0)
MCHC: 32.7 g/dL (ref 30.0–36.0)
MCV: 80.3 fL (ref 78.0–100.0)
MONOS PCT: 5 %
Monocytes Absolute: 0.4 10*3/uL (ref 0.1–1.0)
NEUTROS ABS: 5.9 10*3/uL (ref 1.7–7.7)
NEUTROS PCT: 74 %
Platelets: 305 10*3/uL (ref 150–400)
RBC: 3.5 MIL/uL — ABNORMAL LOW (ref 3.87–5.11)
RDW: 14.2 % (ref 11.5–15.5)
WBC: 7.9 10*3/uL (ref 4.0–10.5)

## 2016-10-06 LAB — MAGNESIUM: MAGNESIUM: 1.3 mg/dL — AB (ref 1.7–2.4)

## 2016-10-06 LAB — BRAIN NATRIURETIC PEPTIDE: B NATRIURETIC PEPTIDE 5: 39.3 pg/mL (ref 0.0–100.0)

## 2016-10-06 LAB — PHOSPHORUS: PHOSPHORUS: 3.3 mg/dL (ref 2.5–4.6)

## 2016-10-06 LAB — INFLUENZA PANEL BY PCR (TYPE A & B)
H1N1 flu by pcr: NOT DETECTED
INFLAPCR: NEGATIVE
INFLBPCR: NEGATIVE

## 2016-10-06 MED ORDER — SODIUM CHLORIDE 0.9 % IV SOLN
INTRAVENOUS | Status: AC
  Administered 2016-10-06: 17:00:00 via INTRAVENOUS

## 2016-10-06 MED ORDER — ENOXAPARIN SODIUM 30 MG/0.3ML ~~LOC~~ SOLN
30.0000 mg | SUBCUTANEOUS | Status: DC
  Administered 2016-10-06 – 2016-10-08 (×3): 30 mg via SUBCUTANEOUS
  Filled 2016-10-06 (×3): qty 0.3

## 2016-10-06 MED ORDER — ONDANSETRON HCL 4 MG/2ML IJ SOLN
4.0000 mg | Freq: Four times a day (QID) | INTRAMUSCULAR | Status: DC | PRN
  Administered 2016-10-07: 4 mg via INTRAVENOUS

## 2016-10-06 MED ORDER — PROMETHAZINE HCL 25 MG/ML IJ SOLN: 12.5000 mg | Freq: Four times a day (QID) | INTRAMUSCULAR | Status: DC | PRN

## 2016-10-06 MED ORDER — ACETAMINOPHEN 325 MG PO TABS: 650.0000 mg | ORAL_TABLET | Freq: Four times a day (QID) | ORAL | Status: DC | PRN

## 2016-10-06 MED ORDER — ONDANSETRON HCL 4 MG/2ML IJ SOLN
4.0000 mg | Freq: Once | INTRAMUSCULAR | Status: AC
  Administered 2016-10-06: 4 mg via INTRAVENOUS
  Filled 2016-10-06: qty 2

## 2016-10-06 MED ORDER — METOCLOPRAMIDE HCL 5 MG/ML IJ SOLN
5.0000 mg | Freq: Four times a day (QID) | INTRAMUSCULAR | Status: DC
  Administered 2016-10-06 – 2016-10-07 (×3): 5 mg via INTRAVENOUS
  Administered 2016-10-07: 0.5 mg via INTRAVENOUS
  Administered 2016-10-07 – 2016-10-09 (×8): 5 mg via INTRAVENOUS
  Filled 2016-10-06 (×12): qty 2

## 2016-10-06 MED ORDER — ACETAMINOPHEN 650 MG RE SUPP: 650.0000 mg | Freq: Four times a day (QID) | RECTAL | Status: DC | PRN

## 2016-10-06 MED ORDER — ACETAMINOPHEN 500 MG PO TABS
1000.0000 mg | ORAL_TABLET | Freq: Once | ORAL | Status: AC
  Administered 2016-10-06: 1000 mg via ORAL
  Filled 2016-10-06: qty 2

## 2016-10-06 MED ORDER — SODIUM CHLORIDE 0.9 % IV SOLN
500.0000 mg | Freq: Two times a day (BID) | INTRAVENOUS | Status: DC
  Administered 2016-10-06 – 2016-10-08 (×4): 500 mg via INTRAVENOUS
  Filled 2016-10-06 (×6): qty 5

## 2016-10-06 MED ORDER — SODIUM CHLORIDE 0.9 % IV BOLUS (SEPSIS)
500.0000 mL | Freq: Once | INTRAVENOUS | Status: AC
  Administered 2016-10-06: 500 mL via INTRAVENOUS

## 2016-10-06 NOTE — ED Provider Notes (Addendum)
[facility] Provider Note   CSN: [id number] Arrival date & time: 10/06/16  1145     History   Chief Complaint Chief Complaint  Patient presents with  . Shortness of Breath    HPI [name] is a 82 y.o. female.  82 yo F with a chief complaint of shortness of breath. This been going on for about 3 or 4 days and she got her flu vaccine. Also having some subjective fevers and chills. Having some nausea and vomiting. Denies any abdominal pain. Denies dysuria.   The history is provided by the patient and the spouse.  Shortness of Breath  This is a new problem. The average episode lasts 4 days. The problem occurs continuously.The current episode started less than 1 hour ago. The problem has been gradually worsening. Associated symptoms include a fever (subjective). Pertinent negatives include no headaches, no rhinorrhea, no wheezing, no chest pain and no vomiting. It is unknown what precipitated the problem. She has tried nothing for the symptoms. The treatment provided no relief. She has had no prior hospitalizations. She has had no prior ED visits. She has had no prior ICU admissions. Associated medical issues include heart failure. Associated medical issues do not include asthma, COPD or PE.    Past Medical History:  Diagnosis Date  . Anxiety   . AVM (arteriovenous malformation) of colon   . CAD (coronary artery disease)   . Cervical polyp   . Chronic kidney disease   . Diverticulosis   . Esophageal stricture   . GERD (gastroesophageal reflux disease)   . Hiatal hernia   . Hypertension   . Iron deficiency anemia   . Osteoarthritis   . Seizures ([place])   . Tubular adenoma of colon 2014  . Ventricular hypertrophy     Patient Active Problem List   Diagnosis Date Noted  . Acute kidney injury superimposed on chronic kidney disease ([place]) 10/06/2016  . Metabolic acidosis 84/13/2440  . Dehydration with hyponatremia 10/06/2016  . Sinus bradycardia 10/03/2016  .  Hypokalemia 12/10/2015  . DNR (do not resuscitate)   . Palliative care encounter   . Weakness   . Hyperkalemia 12/09/2015  . Hypernatremia 12/09/2015  . Troponin level elevated 12/09/2015  . Anemia, iron deficiency 12/09/2015  . Coronary artery disease involving native coronary artery of native heart without angina pectoris 12/09/2015  . Nausea & vomiting 12/08/2015  . Seizure disorder ([place]) 02/05/2008  . Essential hypertension 06/22/2007    Past Surgical History:  Procedure Laterality Date  . ANGIOPLASTY  1999  . Bilateral Total Knee Replacements     Left Knee-12/29/2003, Right Knee-04/10/2002  . BREAST SURGERY    . CARDIAC CATHETERIZATION    . CARPAL TUNNEL RELEASE    . CATARACT EXTRACTION    . COLONOSCOPY    . COLONOSCOPY W/ BIOPSIES    . CORONARY ANGIOPLASTY    . INGUINAL HERNIA REPAIR  08/08/2006   RIGHT INGUINAL HERNIA REPAIR WITH MESH  . JOINT REPLACEMENT    . KNEE ARTHROSCOPY    . LAPAROSCOPIC APPENDECTOMY    . LAPAROSCOPIC CHOLECYSTECTOMY    . MAJOR DUCT EXCISION OF LEFT BREAST  06/01/2004  . RIGHT SHOULDER ARTHROSCOPY  12/18/2006  . Right Total Hip Athroplasty  01/23/2002  . TONSILLECTOMY      OB History    No data available       Home Medications    Prior to Admission medications   Medication Sig Start Date End Date Taking? Authorizing Provider  amLODipine (NORVASC) 10 MG tablet Take 1 tablet (10 mg total) by mouth daily. 10/04/16 01/02/17 Yes Thayer Headings, MD  aspirin EC 81 MG tablet Take 81 mg by mouth daily.   Yes Historical Provider, MD  clopidogrel (PLAVIX) 75 MG tablet Take 75 mg by mouth daily.  08/21/14  Yes Historical Provider, MD  colchicine (COLCRYS) 0.6 MG tablet Take 0.5 tablets (0.3 mg total) by mouth daily. Patient taking differently: Take 0.6 mg by mouth daily.  12/14/15  Yes Modena Jansky, MD  doxazosin (CARDURA) 8 MG tablet Take 1 tablet (8 mg total) by mouth daily. 10/03/16  Yes Thayer Headings, MD  furosemide (LASIX) 80 MG tablet Take 1  tablet (80 mg total) by mouth daily. 10/04/16 01/02/17 Yes Thayer Headings, MD  hydrALAZINE (APRESOLINE) 50 MG tablet Take 1 tablet (50 mg total) by mouth 3 (three) times daily. 10/04/16 01/02/17 Yes Thayer Headings, MD  levETIRAcetam (KEPPRA) 500 MG tablet Take 500 mg by mouth 2 (two) times daily.   Yes Historical Provider, MD  ondansetron (ZOFRAN) 4 MG tablet Take 4 mg by mouth every 8 (eight) hours as needed for nausea or vomiting.   Yes Historical Provider, MD  triamcinolone cream (KENALOG) 0.5 % Apply 1 application topically 2 (two) times daily.  10/02/16  Yes Historical Provider, MD    Family History Family History  Problem Relation Age of Onset  . Hypertension Mother   . Stroke Mother   . Cancer Father   . Cancer Sister   . Other Brother     brain tumor    Social History Social History  Substance Use Topics  . Smoking status: Former Research scientist (life sciences)  . Smokeless tobacco: Never Used  . Alcohol use No     Allergies   Haldol [haloperidol decanoate]; Lorazepam; and Cephalexin   Review of Systems Review of Systems  Constitutional: Positive for chills and fever (subjective).  HENT: Negative for congestion and rhinorrhea.   Eyes: Negative for redness and visual disturbance.  Respiratory: Positive for shortness of breath. Negative for wheezing.   Cardiovascular: Negative for chest pain and palpitations.  Gastrointestinal: Negative for nausea and vomiting.  Genitourinary: Negative for dysuria and urgency.  Musculoskeletal: Negative for arthralgias and myalgias.  Skin: Negative for pallor and wound.  Neurological: Positive for weakness. Negative for dizziness and headaches.     Physical Exam Updated Vital Signs BP (!) 144/52 (BP Location: Right Arm)   Pulse (!) 102   Temp 99.2 F (37.3 C) (Oral)   Resp 17   Ht 5' (1.524 m)   Wt 159 lb 9.8 oz (72.4 kg)   SpO2 98%   BMI 31.17 kg/m   Physical Exam  Constitutional: She is oriented to person, place, and time. She appears  well-developed and well-nourished. No distress.  HENT:  Head: Normocephalic and atraumatic.  Eyes: EOM are normal. Pupils are equal, round, and reactive to light.  Neck: Normal range of motion. Neck supple.  Cardiovascular: Normal rate and regular rhythm.  Exam reveals no gallop and no friction rub.   No murmur heard. Pulmonary/Chest: Effort normal. She has no wheezes. She has no rales.  Abdominal: Soft. She exhibits no distension and no mass. There is no tenderness. There is no guarding.  Musculoskeletal: She exhibits no edema or tenderness.  Neurological: She is alert and oriented to person, place, and time.  Skin: Skin is warm and dry. She is not diaphoretic.  Psychiatric: She has a normal mood and affect. Her  behavior is normal.  Nursing note and vitals reviewed.    ED Treatments / Results  Labs (all labs ordered are listed, but only abnormal results are displayed) Labs Reviewed  CBC WITH DIFFERENTIAL/PLATELET - Abnormal; Notable for the following:       Result Value   RBC 3.50 (*)    Hemoglobin 9.2 (*)    HCT 28.1 (*)    All other components within normal limits  BASIC METABOLIC PANEL - Abnormal; Notable for the following:    Sodium 131 (*)    CO2 16 (*)    Glucose, Bld 137 (*)    BUN 53 (*)    Creatinine, Ser 3.66 (*)    GFR calc non Af Amer 10 (*)    GFR calc Af Amer 12 (*)    All other components within normal limits  URINALYSIS, ROUTINE W REFLEX MICROSCOPIC (NOT AT [facility]) - Abnormal; Notable for the following:    APPearance CLOUDY (*)    Hgb urine dipstick TRACE (*)    Protein, ur 30 (*)    Leukocytes, UA TRACE (*)    All other components within normal limits  MAGNESIUM - Abnormal; Notable for the following:    Magnesium 1.3 (*)    All other components within normal limits  CBC - Abnormal; Notable for the following:    RBC 2.91 (*)    Hemoglobin 8.0 (*)    HCT 23.7 (*)    All other components within normal limits  URINE MICROSCOPIC-ADD ON - Abnormal; Notable  for the following:    Squamous Epithelial / LPF 0-5 (*)    Bacteria, UA RARE (*)    All other components within normal limits  I-STAT CHEM 8, ED - Abnormal; Notable for the following:    Sodium 132 (*)    BUN 60 (*)    Creatinine, Ser 3.90 (*)    Glucose, Bld 134 (*)    Calcium, Ion 1.13 (*)    Hemoglobin 10.2 (*)    HCT 30.0 (*)    All other components within normal limits  URINE CULTURE  BRAIN NATRIURETIC PEPTIDE  INFLUENZA PANEL BY PCR (TYPE A & B, H1N1)  PHOSPHORUS  BASIC METABOLIC PANEL  I-STAT TROPOININ, ED  I-STAT TROPOININ, ED  TYPE AND SCREEN  PREPARE RBC (CROSSMATCH)    EKG  EKG Interpretation  Date/Time:  Friday October 06 2016 11:54:50 EDT Ventricular Rate:  93 PR Interval:    QRS Duration: 140 QT Interval:  386 QTC Calculation: 479 R Axis:   21 Text Interpretation:  Undetermined rhythm Left bundle branch block Abnormal ECG No significant change since last tracing Confirmed by Shamon Lobo MD, DANIEL [phone]) on 10/06/2016 12:04:03 PM       Radiology Dg Chest 2 View  Result Date: 10/06/2016 CLINICAL DATA:  Nausea, fever, chills for 2 days EXAM: CHEST  2 VIEW COMPARISON:  12/13/2015 FINDINGS: Large hiatal hernia. Mild cardiomegaly. Lungs are clear. No effusions or acute bony abnormality. IMPRESSION: Large hiatal hernia and mild cardiomegaly.  No active disease. Electronically Signed   By: Rolm Baptise M.D.   On: 10/06/2016 13:20    Procedures Procedures (including critical care time)  Medications Ordered in ED Medications  0.9 %  sodium chloride infusion ( Intravenous Rate/Dose Verify 10/06/16 2000)  ondansetron (ZOFRAN) injection 4 mg (4 mg Intravenous Given 10/07/16 0623)    Or  promethazine (PHENERGAN) injection 12.5 mg ( Intravenous See Alternative 10/07/16 0623)  metoCLOPramide (REGLAN) injection 5 mg (0.5 mg Intravenous Given  10/07/16 8921)  levETIRAcetam (KEPPRA) 500 mg in sodium chloride 0.9 % 100 mL IVPB (500 mg Intravenous New Bag/Given 10/07/16  0623)  enoxaparin (LOVENOX) injection 30 mg (30 mg Subcutaneous Given 10/06/16 2226)  acetaminophen (TYLENOL) tablet 650 mg (not administered)    Or  acetaminophen (TYLENOL) suppository 650 mg (not administered)  0.9 %  sodium chloride infusion (not administered)  sodium chloride 0.9 % bolus 500 mL (0 mLs Intravenous Stopped 10/06/16 1543)  acetaminophen (TYLENOL) tablet 1,000 mg (1,000 mg Oral Given 10/06/16 1439)  ondansetron (ZOFRAN) injection 4 mg (4 mg Intravenous Given 10/06/16 1548)  ondansetron (ZOFRAN) injection 4 mg (4 mg Intravenous Given 10/06/16 2226)     Initial Impression / Assessment and Plan / ED Course  I have reviewed the triage vital signs and the nursing notes.  Pertinent labs & imaging results that were available during my care of the patient were reviewed by me and considered in my medical decision making (see chart for details).  Clinical Course    82 yo F With a chief complaint of shortness of breath. This may be a viral syndrome after her flu vaccination. Her renal function is mildly worse than baseline. We'll give a fluid bolus. Ambulate.   The patient was able to ambulate though became more short of breath and vomited upon returning to her bed. Patient has been unable to tolerate by mouth since she has been here. Patient has no risk factors for PE though I feel like that is something on the differential with shortness of breath on exertion. I'm unable to obtain a CT angios study due to the patient's creatinine. She has listed a history of coronary artery disease, though she does not have listed as a stent placed. Patient denies any history of same. With patient having atypical symptoms will discuss with hospitalist for likely obs.   The patients results and plan were reviewed and discussed.   Any x-rays performed were independently reviewed by myself.   Differential diagnosis were considered with the presenting HPI.  Medications  0.9 %  sodium chloride  infusion ( Intravenous Rate/Dose Verify 10/06/16 2000)  ondansetron (ZOFRAN) injection 4 mg (4 mg Intravenous Given 10/07/16 0623)    Or  promethazine (PHENERGAN) injection 12.5 mg ( Intravenous See Alternative 10/07/16 0623)  metoCLOPramide (REGLAN) injection 5 mg (0.5 mg Intravenous Given 10/07/16 0624)  levETIRAcetam (KEPPRA) 500 mg in sodium chloride 0.9 % 100 mL IVPB (500 mg Intravenous New Bag/Given 10/07/16 0623)  enoxaparin (LOVENOX) injection 30 mg (30 mg Subcutaneous Given 10/06/16 2226)  acetaminophen (TYLENOL) tablet 650 mg (not administered)    Or  acetaminophen (TYLENOL) suppository 650 mg (not administered)  0.9 %  sodium chloride infusion (not administered)  sodium chloride 0.9 % bolus 500 mL (0 mLs Intravenous Stopped 10/06/16 1543)  acetaminophen (TYLENOL) tablet 1,000 mg (1,000 mg Oral Given 10/06/16 1439)  ondansetron (ZOFRAN) injection 4 mg (4 mg Intravenous Given 10/06/16 1548)  ondansetron (ZOFRAN) injection 4 mg (4 mg Intravenous Given 10/06/16 2226)    Vitals:   10/06/16 1730 10/06/16 1826 10/06/16 2143 10/07/16 0509  BP: 139/57 (!) 152/58 (!) 163/66 (!) 144/52  Pulse: 87 96 96 (!) 102  Resp:  18 18 17   Temp:  98.2 F (36.8 C) 98.3 F (36.8 C) 99.2 F (37.3 C)  TempSrc:  Oral Oral Oral  SpO2: 99% 99% 98%   Weight:  159 lb 15.8 oz (72.6 kg) 159 lb 9.8 oz (72.4 kg)   Height:  5' (1.524 m)  Final diagnoses:  SOB (shortness of breath)  Weakness    Admission/ observation were discussed with the admitting physician, patient and/or family and they are comfortable with the plan.     Final Clinical Impressions(s) / ED Diagnoses   Final diagnoses:  SOB (shortness of breath)  Weakness    New Prescriptions Current Discharge Medication List       Deno Etienne, DO 10/07/16 [place], DO 10/07/16 0700

## 2016-10-06 NOTE — ED Triage Notes (Signed)
Pt states sob and weakness since Tues after getting a flu shot.  Denies chest pain or nausea.  Breathing labored and pt has difficulty completing sentences.

## 2016-10-06 NOTE — Progress Notes (Signed)
Received report from [place], [place] from ER.  Pt arrived on unit accompanied by ER staff member.  Pt on room air, left peripheral IV in left hand infusing NS @ 75.  Patient alert and orientated x4, skin is intact. Pt wears upper and lower dentures.  Pt is on seizure precautions.   Orientated pt to room, placed call bell and phone within reach. Educated pt on calling before getting up and pt understands how to use call light.   Pt denies any pain and needs.  Swabbed pt for flu and is on droplet precautions.   Paulla Fore, RN

## 2016-10-06 NOTE — Plan of Care (Signed)
Problem: Pain Managment: Goal: General experience of comfort will improve Outcome: Completed/Met Date Met: 10/06/16 Pt educated on to call if she needs pain med pt verbalized understanding.

## 2016-10-06 NOTE — H&P (Signed)
History and Physical    [name] ZOX:[id number] DOB: [birth year] DOA: 10/06/2016   PCP: Geraldo Pitter, MD   Patient coming from/Resides with: Private residence/lives alone  Admission status: Observation/medical floor -reevaluate in the next 24 hours to determine if it will be medically necessary to stay a minimum 2 midnights to rule out impending and/or unexpected changes in physiologic status that may differ from initial evaluation performed in the ER and/or at time of admission. Patient presents with nausea with inability to eat for several days subsequent development of emesis after arrival to the ER. Has been associated with fevers but no diarrhea. Uncertain if viral syndrome versus secondary to immune response to recent influenza injection on same day of onset of symptoms.  Chief Complaint: Nausea, fever, now vomiting  HPI: [name] is a 82 y.o. female with medical history significant for hypertension, seizure disorder with prior subdural hematoma, chronic anemia secondary to chronic kidney disease as well as iron deficiency, remote CAD, and chronic kidney disease. Patient reports received influenza vaccine this past Tuesday and by Tuesday evening had developed myalgias, low-grade fevers and persistent nausea. Since that time she has been unable to eat. Because of generalized weakness her son encouraged her to come to the ER. After arrival to the ER patient developed emesis and her nausea has been unresponsive to medications so the decision was made to observe for the next 24 hours. Patient has chronic kidney disease and medications have recently been changed with Lasix initiated in place of hydrochlorothiazide. She has taken her medications as usual and currently appears somewhat dehydrated from recent poor oral intake. Her renal function has also slightly worsened since onset of symptoms.  ED Course:  Vital Signs: BP (!) 129/54   Pulse 86   Temp 98 F (36.7 C) (Oral)   Resp 20    Ht 5' (1.524 m)   Wt 72.6 kg (160 lb)   SpO2 99%   BMI 31.25 kg/m  2 view chest x-ray: Large hiatal hernia and mild cardiomegaly without any acute process including no heart failure or focal infiltrates concerning for pneumonia Lab data: Sodium 131, potassium 4.6, CO2 16, BUN 53, creatinine 3.66, glucose 137, anion gap 14, BNP 39.3, poc troponin 0.01, white count 7900 with normal differential, hemoglobin 9.2, platelets 305,000 Medications and treatments: Normal saline bolus 500 mL 1, Tylenol 1 g (patient vomited immediately after taking Tylenol). Zofran 4 mg IV 1  Review of Systems:  In addition to the HPI above,  No Headache, changes with Vision or hearing, new weakness, tingling, numbness in any extremity, dizziness, dysarthria or word finding difficulty, gait disturbance or imbalance, tremors or seizure activity No problems swallowing food or Liquids, indigestion/reflux, choking or coughing while eating, abdominal pain with or after eating No Chest pain, Cough, palpitations, orthopnea or DOE No Abdominal pain, melena,hematochezia, dark tarry stools, constipation No dysuria, malodorous urine, hematuria or flank pain No new skin rashes, lesions, masses or bruises, No new joint pains, aches, swelling or redness No recent unintentional weight gain or loss No polyuria, polydypsia or polyphagia   Past Medical History:  Diagnosis Date  . Anxiety   . AVM (arteriovenous malformation) of colon   . CAD (coronary artery disease)   . Cervical polyp   . Chronic kidney disease   . Diverticulosis   . Esophageal stricture   . GERD (gastroesophageal reflux disease)   . Hiatal hernia   . Hypertension   . Iron deficiency anemia   . Osteoarthritis   .  Seizures (HCC)   . Tubular adenoma of colon 2014  . Ventricular hypertrophy     Past Surgical History:  Procedure Laterality Date  . ANGIOPLASTY  1999  . Bilateral Total Knee Replacements     Left Knee-12/29/2003, Right Knee-04/10/2002  .  BREAST SURGERY    . CARDIAC CATHETERIZATION    . CARPAL TUNNEL RELEASE    . CATARACT EXTRACTION    . COLONOSCOPY    . COLONOSCOPY W/ BIOPSIES    . CORONARY ANGIOPLASTY    . INGUINAL HERNIA REPAIR  08/08/2006   RIGHT INGUINAL HERNIA REPAIR WITH MESH  . JOINT REPLACEMENT    . KNEE ARTHROSCOPY    . LAPAROSCOPIC APPENDECTOMY    . LAPAROSCOPIC CHOLECYSTECTOMY    . MAJOR DUCT EXCISION OF LEFT BREAST  06/01/2004  . RIGHT SHOULDER ARTHROSCOPY  12/18/2006  . Right Total Hip Athroplasty  01/23/2002  . TONSILLECTOMY      Social History   Social History  . Marital status: Widowed    Spouse name: N/A  . Number of children: N/A  . Years of education: N/A   Occupational History  . Not on file.   Social History Main Topics  . Smoking status: Former Games developer  . Smokeless tobacco: Never Used  . Alcohol use No  . Drug use: No  . Sexual activity: No   Other Topics Concern  . Not on file   Social History Narrative  . No narrative on file    Mobility: Rolling walker Work history: Not obtained   Allergies  Allergen Reactions  . Haldol [Haloperidol Decanoate] Other (See Comments)    Agitation, aggressive and combative  . Lorazepam Other (See Comments)    Agitation, lethargic  . Cephalexin Other (See Comments)    REACTION: Questionable, no reaction listed    Family History  Problem Relation Age of Onset  . Hypertension Mother   . Stroke Mother   . Cancer Father   . Cancer Sister   . Other Brother     brain tumor     Prior to Admission medications   Medication Sig Start Date End Date Taking? Authorizing Provider  amLODipine (NORVASC) 10 MG tablet Take 1 tablet (10 mg total) by mouth daily. 10/04/16 01/02/17 Yes Vesta Mixer, MD  aspirin EC 81 MG tablet Take 81 mg by mouth daily.   Yes Historical Provider, MD  clopidogrel (PLAVIX) 75 MG tablet Take 75 mg by mouth daily.  08/21/14  Yes Historical Provider, MD  colchicine (COLCRYS) 0.6 MG tablet Take 0.5 tablets (0.3 mg total)  by mouth daily. 12/14/15  Yes Elease Etienne, MD  doxazosin (CARDURA) 8 MG tablet Take 1 tablet (8 mg total) by mouth daily. 10/03/16  Yes Vesta Mixer, MD  furosemide (LASIX) 80 MG tablet Take 1 tablet (80 mg total) by mouth daily. 10/04/16 01/02/17 Yes Vesta Mixer, MD  hydrALAZINE (APRESOLINE) 50 MG tablet Take 1 tablet (50 mg total) by mouth 3 (three) times daily. 10/04/16 01/02/17 Yes Vesta Mixer, MD  levETIRAcetam (KEPPRA) 500 MG tablet Take 500 mg by mouth 2 (two) times daily.   Yes Historical Provider, MD  triamcinolone cream (KENALOG) 0.5 % Apply 1 application topically 2 (two) times daily.  10/02/16  Yes Historical Provider, MD    Physical Exam: Vitals:   10/06/16 1530 10/06/16 1550 10/06/16 1600 10/06/16 1630  BP: 130/65  120/66 (!) 129/54  Pulse: 91  89 86  Resp:      Temp:  97.9  F (36.6 C)    TempSrc:      SpO2: 100%  100% 99%  Weight:      Height:          Constitutional: NAD, calm, uncomfortable due to intractable nausea with vomiting Eyes: PERRL, lids and conjunctivae normal ENMT: Mucous membranes are dry. Posterior pharynx clear of any exudate or lesions.Normal dentition.  Neck: normal, supple, no masses, no thyromegaly Respiratory: clear to auscultation bilaterally, no wheezing, no crackles. Normal respiratory effort. No accessory muscle use.  Cardiovascular: Regular rate and rhythm, no murmurs / rubs / gallops. No extremity edema. 2+ pedal pulses. No carotid bruits.  Abdomen: no tenderness, no masses palpated. No hepatosplenomegaly. Bowel sounds positive.  Musculoskeletal: no clubbing / cyanosis. No joint deformity upper and lower extremities. Good ROM, no contractures. Normal muscle tone.  Skin: no rashes, lesions, ulcers. No induration-skin hot to touch and flushed Neurologic: CN 2-12 grossly intact. Sensation intact, DTR normal. Strength 5/5 x all 4 extremities.  Psychiatric: Normal judgment and insight. Alert and oriented x 3. Normal mood.    Labs  on Admission: I have personally reviewed following labs and imaging studies  CBC:  Recent Labs Lab 10/06/16 1222 10/06/16 1230  WBC 7.9  --   NEUTROABS 5.9  --   HGB 9.2* 10.2*  HCT 28.1* 30.0*  MCV 80.3  --   PLT 305  --    Basic Metabolic Panel:  Recent Labs Lab 10/03/16 1045 10/06/16 1222 10/06/16 1230  NA 137 131* 132*  K 4.2 4.6 4.6  CL 106 101 103  CO2 20 16*  --   GLUCOSE 93 137* 134*  BUN 52* 53* 60*  CREATININE 3.23* 3.66* 3.90*  CALCIUM 9.9 9.9  --    GFR: Estimated Creatinine Clearance: 9.5 mL/min (by C-G formula based on SCr of 3.9 mg/dL (H)). Liver Function Tests: No results for input(s): AST, ALT, ALKPHOS, BILITOT, PROT, ALBUMIN in the last 168 hours. No results for input(s): LIPASE, AMYLASE in the last 168 hours. No results for input(s): AMMONIA in the last 168 hours. Coagulation Profile: No results for input(s): INR, PROTIME in the last 168 hours. Cardiac Enzymes: No results for input(s): CKTOTAL, CKMB, CKMBINDEX, TROPONINI in the last 168 hours. BNP (last 3 results) No results for input(s): PROBNP in the last 8760 hours. HbA1C: No results for input(s): HGBA1C in the last 72 hours. CBG: No results for input(s): GLUCAP in the last 168 hours. Lipid Profile: No results for input(s): CHOL, HDL, LDLCALC, TRIG, CHOLHDL, LDLDIRECT in the last 72 hours. Thyroid Function Tests: No results for input(s): TSH, T4TOTAL, FREET4, T3FREE, THYROIDAB in the last 72 hours. Anemia Panel: No results for input(s): VITAMINB12, FOLATE, FERRITIN, TIBC, IRON, RETICCTPCT in the last 72 hours. Urine analysis:    Component Value Date/Time   COLORURINE YELLOW 12/08/2015 1606   APPEARANCEUR HAZY (A) 12/08/2015 1606   LABSPEC 1.015 12/08/2015 1606   PHURINE 5.0 12/08/2015 1606   GLUCOSEU NEGATIVE 12/08/2015 1606   HGBUR SMALL (A) 12/08/2015 1606   BILIRUBINUR MODERATE (A) 12/08/2015 1606   KETONESUR 15 (A) 12/08/2015 1606   PROTEINUR 100 (A) 12/08/2015 1606    UROBILINOGEN 0.2 08/18/2013 1049   NITRITE NEGATIVE 12/08/2015 1606   LEUKOCYTESUR SMALL (A) 12/08/2015 1606   Sepsis Labs: @LABRCNTIP (procalcitonin:4,lacticidven:4) )No results found for this or any previous visit (from the past 240 hour(s)).   Radiological Exams on Admission: Dg Chest 2 View  Result Date: 10/06/2016 CLINICAL DATA:  Nausea, fever, chills for 2 days  EXAM: CHEST  2 VIEW COMPARISON:  12/13/2015 FINDINGS: Large hiatal hernia. Mild cardiomegaly. Lungs are clear. No effusions or acute bony abnormality. IMPRESSION: Large hiatal hernia and mild cardiomegaly.  No active disease. Electronically Signed   By: Charlett Nose M.D.   On: 10/06/2016 13:20    EKG: (Independently reviewed) difficult to determine rhythm but appears to be sinus with variable P-wave morphology and prolonged QT interval in setting of left bundle branch block which is chronic, QTC 479 ms, ventricular rate 93 bpm  Assessment/Plan Principal Problem:   Acute kidney injury superimposed on chronic kidney disease stage IV/  Metabolic acidosis -Patient presents with nausea, fever home, poor oral intake and now has developed emesis since arrival to the ER with renal function slightly worsened consistent with acute kidney injury secondary to apparent dehydration -Noted with progressive worsening of chronic kidney disease with recent labs cardiologist office prompting adjustments in medications (see below) -Baseline renal function: 52/3.23 which has increased from previous baseline in January of 33/2.2 -Current renal function: 60/3.9 -Hold preadmission diuretic -Blood pressure somewhat suboptimal so will attempt to improve renal perfusion by holding Norvasc as well as recently started Cardura -Check renal artery duplex scan to clarify if has underlying renal artery stenosis (listed in medical problems but unable to locate any confirmatory diagnostic studies) -Consider discontinuation of Colcrys given advanced chronic  kidney disease -Patient with progressive metabolic acidemia likely related to chronicity of kidney disease and may benefit from oral bicarbonate after discharge -Cardiologist documents patient has outpatient referral in place for nephrology evaluation  Active Problems:   Nausea & vomiting -Has associated fever without diarrhea which began within a few hours of receiving influenza injection-suspect significant immune response to influenza injection causing symptoms -Need to rule out other viral etiology such as viral gastroenteritis -No documented fever here but will go ahead and check influenza PCR -Continue supportive care with prn Zofran and Phenergan -QT less than 500 ms so we'll begin scheduled Reglan IV every 6 hours -Gentle IV fluid hydration at 75 mL per hour for 12 hours only then reevaluate need for fluids -Allow clear liquids if patient can tolerate -Nausea and vomiting can be a sign of a UTI in elderly patients so will check urinalysis and culture     Essential hypertension/chronic diastolic heart failure grade 1 with severe hypertrophy of septal wall mild hypertrophy posterior wall left ventricle -Due to recent worsening of kidney disease patient's cardiologist discontinued her hydrochlorothiazide as well as her ARB on 10/24 and started Lasix 80 mg daily which patient has been taking -Because of recent bradycardia, clonidine was discontinued and she was started on Cardura 8 mg at night on 10/24 -As noted above, are holding all antihypertensive medications to ensure adequate renal perfusion while acutely ill noting current blood pressure suboptimal -Patient presented with shortness of breath likely related to metabolic acidemia noting chest x-ray unremarkable and no clinical signs of heart failure or volume overload    Seizure disorder  -Change home Keppra to IV route    Dehydration with hyponatremia -IV fluid hydration as above -Repeat chemistries in a.m.    Anemia, iron  deficiency -Hemoglobin is stable around 9-10 -Given chronic kidney disease may need to check erythropoietin level    Coronary artery disease involving native coronary artery of native heart without angina pectoris -Currently asymptomatic -In setting of nausea and vomiting preadmission aspirin and Plavix are on hold -Not on beta blocker noting recent issues with bradycardia      DVT prophylaxis:  Renal dose adjusted Lovenox  Code Status: In the past has been a DO NOT RESUSCITATE confirmed with both patient and son that she is a full today  Family Communication: Son at bedside  Disposition Plan: Anticipate discharge back to preadmission home environment when medically stable  Consults called: None    Nikeria Kalman L. ANP-BC [facility] Pager [phone]   If 7PM-7AM, please contact night-coverage [email or web address] Password [email or web address]  10/06/2016, 5:01 PM

## 2016-10-06 NOTE — Plan of Care (Signed)
Problem: Safety: Goal: Ability to remain free from injury will improve Educated pt on high fall risk safety to call before she gets up pt verbalized understanding

## 2016-10-07 ENCOUNTER — Observation Stay (HOSPITAL_COMMUNITY): Payer: Medicare Other

## 2016-10-07 DIAGNOSIS — D631 Anemia in chronic kidney disease: Secondary | ICD-10-CM | POA: Diagnosis present

## 2016-10-07 DIAGNOSIS — Z9861 Coronary angioplasty status: Secondary | ICD-10-CM | POA: Diagnosis not present

## 2016-10-07 DIAGNOSIS — N179 Acute kidney failure, unspecified: Secondary | ICD-10-CM | POA: Diagnosis not present

## 2016-10-07 DIAGNOSIS — E871 Hypo-osmolality and hyponatremia: Secondary | ICD-10-CM | POA: Diagnosis present

## 2016-10-07 DIAGNOSIS — I480 Paroxysmal atrial fibrillation: Secondary | ICD-10-CM | POA: Diagnosis present

## 2016-10-07 DIAGNOSIS — N189 Chronic kidney disease, unspecified: Secondary | ICD-10-CM | POA: Diagnosis not present

## 2016-10-07 DIAGNOSIS — Z8249 Family history of ischemic heart disease and other diseases of the circulatory system: Secondary | ICD-10-CM | POA: Diagnosis not present

## 2016-10-07 DIAGNOSIS — Z96653 Presence of artificial knee joint, bilateral: Secondary | ICD-10-CM | POA: Diagnosis present

## 2016-10-07 DIAGNOSIS — I130 Hypertensive heart and chronic kidney disease with heart failure and stage 1 through stage 4 chronic kidney disease, or unspecified chronic kidney disease: Secondary | ICD-10-CM | POA: Diagnosis present

## 2016-10-07 DIAGNOSIS — Z7902 Long term (current) use of antithrombotics/antiplatelets: Secondary | ICD-10-CM | POA: Diagnosis not present

## 2016-10-07 DIAGNOSIS — E860 Dehydration: Secondary | ICD-10-CM | POA: Diagnosis present

## 2016-10-07 DIAGNOSIS — I447 Left bundle-branch block, unspecified: Secondary | ICD-10-CM | POA: Diagnosis present

## 2016-10-07 DIAGNOSIS — Z87891 Personal history of nicotine dependence: Secondary | ICD-10-CM | POA: Diagnosis not present

## 2016-10-07 DIAGNOSIS — N184 Chronic kidney disease, stage 4 (severe): Secondary | ICD-10-CM | POA: Diagnosis present

## 2016-10-07 DIAGNOSIS — I2510 Atherosclerotic heart disease of native coronary artery without angina pectoris: Secondary | ICD-10-CM | POA: Diagnosis present

## 2016-10-07 DIAGNOSIS — D509 Iron deficiency anemia, unspecified: Secondary | ICD-10-CM | POA: Diagnosis present

## 2016-10-07 DIAGNOSIS — R531 Weakness: Secondary | ICD-10-CM | POA: Diagnosis not present

## 2016-10-07 DIAGNOSIS — E872 Acidosis: Secondary | ICD-10-CM | POA: Diagnosis present

## 2016-10-07 DIAGNOSIS — Z7982 Long term (current) use of aspirin: Secondary | ICD-10-CM | POA: Diagnosis not present

## 2016-10-07 DIAGNOSIS — I5032 Chronic diastolic (congestive) heart failure: Secondary | ICD-10-CM | POA: Diagnosis present

## 2016-10-07 DIAGNOSIS — Z823 Family history of stroke: Secondary | ICD-10-CM | POA: Diagnosis not present

## 2016-10-07 DIAGNOSIS — G40909 Epilepsy, unspecified, not intractable, without status epilepticus: Secondary | ICD-10-CM | POA: Diagnosis present

## 2016-10-07 DIAGNOSIS — K219 Gastro-esophageal reflux disease without esophagitis: Secondary | ICD-10-CM | POA: Diagnosis present

## 2016-10-07 LAB — BASIC METABOLIC PANEL
Anion gap: 12 (ref 5–15)
BUN: 49 mg/dL — AB (ref 6–20)
CHLORIDE: 103 mmol/L (ref 101–111)
CO2: 18 mmol/L — AB (ref 22–32)
CREATININE: 3.62 mg/dL — AB (ref 0.44–1.00)
Calcium: 9.3 mg/dL (ref 8.9–10.3)
GFR calc Af Amer: 12 mL/min — ABNORMAL LOW (ref >60)
GFR calc non Af Amer: 11 mL/min — ABNORMAL LOW (ref >60)
GLUCOSE: 124 mg/dL — AB (ref 65–99)
Potassium: 4.1 mmol/L (ref 3.5–5.1)
SODIUM: 133 mmol/L — AB (ref 135–145)

## 2016-10-07 LAB — URINALYSIS, ROUTINE W REFLEX MICROSCOPIC
Bilirubin Urine: NEGATIVE
GLUCOSE, UA: NEGATIVE mg/dL
Ketones, ur: NEGATIVE mg/dL
NITRITE: NEGATIVE
PH: 5 (ref 5.0–8.0)
Protein, ur: 30 mg/dL — AB
SPECIFIC GRAVITY, URINE: 1.009 (ref 1.005–1.030)

## 2016-10-07 LAB — CBC
HCT: 23.7 % — ABNORMAL LOW (ref 36.0–46.0)
Hemoglobin: 8 g/dL — ABNORMAL LOW (ref 12.0–15.0)
MCH: 27.5 pg (ref 26.0–34.0)
MCHC: 33.8 g/dL (ref 30.0–36.0)
MCV: 81.4 fL (ref 78.0–100.0)
PLATELETS: 250 10*3/uL (ref 150–400)
RBC: 2.91 MIL/uL — ABNORMAL LOW (ref 3.87–5.11)
RDW: 14.4 % (ref 11.5–15.5)
WBC: 9 10*3/uL (ref 4.0–10.5)

## 2016-10-07 LAB — URINE MICROSCOPIC-ADD ON

## 2016-10-07 LAB — PREPARE RBC (CROSSMATCH)

## 2016-10-07 MED ORDER — SODIUM CHLORIDE 0.9 % IV SOLN
INTRAVENOUS | Status: DC
  Administered 2016-10-07: 22:00:00 via INTRAVENOUS

## 2016-10-07 MED ORDER — SODIUM CHLORIDE 0.9 % IV SOLN
Freq: Once | INTRAVENOUS | Status: AC
  Administered 2016-10-07: 12:00:00 via INTRAVENOUS

## 2016-10-07 MED ORDER — HYDRALAZINE HCL 50 MG PO TABS
50.0000 mg | ORAL_TABLET | Freq: Three times a day (TID) | ORAL | Status: DC
  Administered 2016-10-07 – 2016-10-09 (×6): 50 mg via ORAL
  Filled 2016-10-07 (×6): qty 1

## 2016-10-07 NOTE — Progress Notes (Signed)
Preliminary results by tech - Renal Duplex Completed. Bilateral kidneys demonstrated increased echogenicity. Left kidney appeared smaller than right measuring approximately 8.4 cm with poor arterial perfusion to the kidney. Renal artery occlusion cannot be excluded, Doppler flow was technically difficult to obtained. The right renal artery appeared patent with no obvious evidence of a significant stenosis.  Oda Cogan, BS, RDMS, RVT

## 2016-10-07 NOTE — Progress Notes (Signed)
[name]:[id number] DOB: Aug 28, 1930 DOA: 10/06/2016 PCP: [name], MD  Brief narrative:  76 ? htn Chr LBBB Remote Stress 2005 PAS to RAS P Afib Chad2vasc2 not on AC Anemia Prior Sz disorder Chr sinus bradycardia  Recent seen Cardiology office-meds changed from HCTZ and ARBS stopped --> lasix as Creatinine had risen from 2.2-->3.2 in 6 mo Work-up FOr RAS was also undertaken as no real firm diagnosis RAS  Developed myalgia, nausea and AKI All BP meds held on admit    Subjective  Alert pleasant oriented and in NAD Npo for procedure No cp Some burning in chest No n/v since admit but had 2 episodes on arrival to hospital No cough/cold/chills rigors    Objective     Objective: Vitals:   10/06/16 1730 10/06/16 1826 10/06/16 2143 10/07/16 0509  BP: 139/57 (!) 152/58 (!) 163/66 (!) 144/52  Pulse: 87 96 96 (!) 102  Resp:  18 18 17   Temp:  98.2 F (36.8 C) 98.3 F (36.8 C) 99.2 F (37.3 C)  TempSrc:  Oral Oral Oral  SpO2: 99% 99% 98%   Weight:  72.6 kg (159 lb 15.8 oz) 72.4 kg (159 lb 9.8 oz)   Height:  5' (1.524 m)      Intake/Output Summary (Last 24 hours) at 10/07/16 0818 Last data filed at 10/07/16 0606  Gross per 24 hour  Intake            192.5 ml  Output              650 ml  Net           -457.5 ml    Exam:  General: alert pleasant frail in and Cardiovascular: s1 s2, HSM at L 5 ICS Respiratory: clear, no added sound Abdomen:  Soft no rebound no gaurd Skin no le edema Neuro intact  Data Reviewed: Basic Metabolic Panel:  Recent Labs Lab 10/03/16 1045 10/06/16 1222 10/06/16 1230 10/06/16 1849 10/07/16 0430  NA 137 131* 132*  --  133*  K 4.2 4.6 4.6  --  4.1  CL 106 101 103  --  103  CO2 20 16*  --   --  18*  GLUCOSE 93 137* 134*  --  124*  BUN 52* 53* 60*  --  49*  CREATININE 3.23* 3.66* 3.90*  --  3.62*  CALCIUM 9.9 9.9  --   --  9.3  MG  --   --   --  1.3*  --   PHOS  --   --   --  3.3  --    Liver Function  Tests: No results for input(s): AST, ALT, ALKPHOS, BILITOT, PROT, ALBUMIN in the last 168 hours. No results for input(s): LIPASE, AMYLASE in the last 168 hours. No results for input(s): AMMONIA in the last 168 hours. CBC:  Recent Labs Lab 10/06/16 1222 10/06/16 1230 10/07/16 0430  WBC 7.9  --  9.0  NEUTROABS 5.9  --   --   HGB 9.2* 10.2* 8.0*  HCT 28.1* 30.0* 23.7*  MCV 80.3  --  81.4  PLT 305  --  250   Cardiac Enzymes: No results for input(s): CKTOTAL, CKMB, CKMBINDEX, TROPONINI in the last 168 hours. BNP: Invalid input(s): POCBNP CBG: No results for input(s): GLUCAP in the last 168 hours.  No results found for this or any previous visit (from the past 240 hour(s)).   Studies:  All Imaging reviewed and is as per above notation   Scheduled Meds: . sodium chloride   Intravenous Once  . enoxaparin (LOVENOX) injection  30 mg Subcutaneous Q24H  . levETIRAcetam  500 mg Intravenous Q12H  . metoCLOPramide (REGLAN) injection  5 mg Intravenous Q6H   Continuous Infusions: . sodium chloride 75 mL/hr at 10/06/16 2000     Assessment/Plan:    Acute kidney injury superimposed on chronic kidney disease stage IV/  Metabolic acidosis -Patient presents with nausea, fever home, poor oral intake and now has developed emesis since arrival to the ER with renal function slightly worsened consistent with acute kidney injury secondary to apparent dehydration -Baseline renal function: 52/3.23 --Current renal function: 60/3.9 -Check renal artery duplex scan to clarify if has underlying renal artery stenosis  -Cardiologist documents patient has outpatient referral in place for nephrology evaluation -consider bicarb -rpt Magensium in am and if still low supplement    Nausea & vomiting? VIral illness vs UTI -Has associated fever without diarrhea which began within a few hours of receiving influenza injection-suspect significant immune response to influenza injection causing  symptoms -Need to rule out other viral etiology such as viral gastroenteritis -No documented fever here but will go ahead and check influenza PCR -Gentle IV fluid hydration at 75 mL per hour for 12 hours only then reevaluate need for fluids -Allow clear liquids if patient can tolerate -follow UC     Essential hypertension/chronic diastolic heart failure grade 1 with severe hypertrophy of septal wall mild hypertrophy posterior wall left ventricle - recentlycardiologist discontinued her hydrochlorothiazide as well as her ARB on 10/24 and started Lasix 80 mg daily which patient has been taking -Because of recent bradycardia, clonidine was discontinued and she was started on Cardura 8 mg at night on 10/24 - holding all antihypertensive medication    Seizure disorder  -Change home Keppra to IV route    Anemia, iron deficiency Element of chronic renals diseas eas well as dilutional component -Hemoglobin is stable around 9-10 -Given chronic kidney disease may need to check erythropoietin level -hemoglobin has dropped to 8 Type and screen and monitor and if persistingly at this level would transfuse    Coronary artery disease involving native coronary artery of native heart without angina pectoris -Currently asymptomatic -In setting of nausea and vomiting preadmission aspirin and Plavix are on hold -Not on beta blocker noting recent issues with bradycardia  No family + at bedside-called and discussed with daughter Npo for procedure and then CLD once performed Expect resolution of her chronic illnesses Up OOB once procedure performed  Verneita Griffes, MD  [facility] Pager [phone] 10/07/2016, 8:18 AM    LOS: 0 days

## 2016-10-08 LAB — BASIC METABOLIC PANEL
Anion gap: 10 (ref 5–15)
BUN: 42 mg/dL — AB (ref 6–20)
CHLORIDE: 107 mmol/L (ref 101–111)
CO2: 20 mmol/L — ABNORMAL LOW (ref 22–32)
CREATININE: 3.28 mg/dL — AB (ref 0.44–1.00)
Calcium: 9.1 mg/dL (ref 8.9–10.3)
GFR calc Af Amer: 14 mL/min — ABNORMAL LOW (ref >60)
GFR, EST NON AFRICAN AMERICAN: 12 mL/min — AB (ref >60)
GLUCOSE: 104 mg/dL — AB (ref 65–99)
POTASSIUM: 3.4 mmol/L — AB (ref 3.5–5.1)
Sodium: 137 mmol/L (ref 135–145)

## 2016-10-08 LAB — MAGNESIUM: Magnesium: 1.4 mg/dL — ABNORMAL LOW (ref 1.7–2.4)

## 2016-10-08 LAB — URINE CULTURE

## 2016-10-08 MED ORDER — LEVETIRACETAM 500 MG PO TABS
500.0000 mg | ORAL_TABLET | Freq: Two times a day (BID) | ORAL | Status: DC
  Administered 2016-10-08 – 2016-10-09 (×2): 500 mg via ORAL
  Filled 2016-10-08 (×2): qty 1

## 2016-10-08 NOTE — Progress Notes (Signed)
[name] [id number]:[id number] DOB: 03/28/1930 DOA: 10/06/2016 PCP: [name], MD  Brief narrative:  63 ? htn Chr LBBB Remote Stress 2005 PAS to RAS P Afib Chad2vasc2 not on AC Anemia Prior Sz disorder Chr sinus bradycardia  Recent seen Cardiology office-meds changed from HCTZ and ARBS stopped --> lasix as Creatinine had risen from 2.2-->3.2 in 6 mo Work-up FOr RAS was also undertaken as no real firm diagnosis RAS  Developed myalgia, nausea and AKI All BP meds held on admit    Subjective   Low grad temp overnight Some abd discomfort but able to take in more clears today than yesterday.  No cp No cough No fever No diarr No vomit    Objective     Objective: Vitals:   10/07/16 2121 10/08/16 0213 10/08/16 0516 10/08/16 0847  BP: (!) 186/69 (!) 186/73 (!) 185/58 (!) 167/57  Pulse: 91 97 88 94  Resp: 18 18 18 17   Temp: (!) 100.8 F (38.2 C) 99.8 F (37.7 C) 99.2 F (37.3 C) 100.2 F (37.9 C)  TempSrc: Oral Oral Oral Oral  SpO2: 97% 95% 96% 95%  Weight: 72.2 kg (159 lb 2.8 oz)     Height:        Intake/Output Summary (Last 24 hours) at 10/08/16 0852 Last data filed at 10/08/16 0657  Gross per 24 hour  Intake          2295.75 ml  Output             1850 ml  Net           445.75 ml    Exam:  General: alert pleasant frail in and Cardiovascular: s1 s2, HSM at L 5 ICS Respiratory: clear, no added sound Abdomen:  Soft no rebound no gaurd Skin no le edema Neuro intact  Data Reviewed: Basic Metabolic Panel:  Recent Labs Lab 10/03/16 1045 10/06/16 1222 10/06/16 1230 10/06/16 1849 10/07/16 0430 10/08/16 0513  NA 137 131* 132*  --  133* 137  K 4.2 4.6 4.6  --  4.1 3.4*  CL 106 101 103  --  103 107  CO2 20 16*  --   --  18* 20*  GLUCOSE 93 137* 134*  --  124* 104*  BUN 52* 53* 60*  --  49* 42*  CREATININE 3.23* 3.66* 3.90*  --  3.62* 3.28*  CALCIUM 9.9 9.9  --   --  9.3 9.1  MG  --   --   --  1.3*  --   --   PHOS  --   --   --  3.3  --   --     Liver Function Tests: No results for input(s): AST, ALT, ALKPHOS, BILITOT, PROT, ALBUMIN in the last 168 hours. No results for input(s): LIPASE, AMYLASE in the last 168 hours. No results for input(s): AMMONIA in the last 168 hours. CBC:  Recent Labs Lab 10/06/16 1222 10/06/16 1230 10/07/16 0430  WBC 7.9  --  9.0  NEUTROABS 5.9  --   --   HGB 9.2* 10.2* 8.0*  HCT 28.1* 30.0* 23.7*  MCV 80.3  --  81.4  PLT 305  --  250   Cardiac Enzymes: No results for input(s): CKTOTAL, CKMB, CKMBINDEX, TROPONINI in the last 168 hours. BNP: Invalid input(s): POCBNP CBG: No results for input(s): GLUCAP in the last 168 hours.  Recent Results (from the past 240 hour(s))  Urine culture     Status: Abnormal   Collection Time:  10/06/16 11:20 PM  Result Value Ref Range Status   Specimen Description URINE, RANDOM  Final   Special Requests NONE  Final   Culture MULTIPLE SPECIES PRESENT, SUGGEST RECOLLECTION (A)  Final   Report Status 10/08/2016 FINAL  Final     Studies:              All Imaging reviewed and is as per above notation   Scheduled Meds: . enoxaparin (LOVENOX) injection  30 mg Subcutaneous Q24H  . hydrALAZINE  50 mg Oral TID  . levETIRAcetam  500 mg Intravenous Q12H  . metoCLOPramide (REGLAN) injection  5 mg Intravenous Q6H   Continuous Infusions:     Assessment/Plan:    Acute kidney injury superimposed on chronic kidney disease stage IV/  Metabolic acidosis -Patient presents with nausea, fever home, poor oral intake and now has developed emesis since arrival to the ER with renal function slightly worsened consistent with acute kidney injury secondary to apparent dehydration -Baseline renal function: 52/3.23 --Current renal function: 60/3.9 - renal artery duplex scan suggestive of blockage-needs OP referral to vasc/INterventionalist -consider bicarb -rpt Magensium in am and if still low supplement    Nausea & vomiting? VIral illness vs UTI -Has associated fever without  diarrhea which began within a few hours of receiving influenza injection-suspect  influenza injection causing symptoms -rule out other viral etiology such as viral gastroenteritis -Flu PCR neg -NSIV 10/29 -graduate from clear to solid diet     Essential hypertension/chronic diastolic heart failure grade 1 with severe hypertrophy of septal wall mild hypertrophy posterior wall left ventricle - recently cardiologist discontinued her hydrochlorothiazide as well as her ARB on 10/24 and started Lasix 80 mg daily which patient has been taking -Because of recent bradycardia, clonidine was discontinued and she was started on Cardura 8 mg at night on 10/24 - holding all antihypertensive medication    Seizure disorder  -Change home Keppra to IV route    Anemia, iron deficiency Element of chronic renals diseas eas well as dilutional component -Hemoglobin is stable around 9-10 -Given chronic kidney disease may need to check erythropoietin level -hemoglobin has dropped to 8 -rpt labs am and re-assess, Hemoccult stool    Coronary artery disease involving native coronary artery of native heart without angina pectoris -Currently asymptomatic -In setting of nausea and vomiting preadmission aspirin and Plavix are on hold -Not on beta blocker noting recent issues with bradycardia  Advanced diet.  D/w daughter, if no fruthe rissues no fever and chills will d/c home probably am oob to chair and in halls desat screen Need sOP discussion re: RAS  Verneita Griffes, MD  [facility] Pager [phone] 10/08/2016, 8:52 AM    LOS: 1 day

## 2016-10-08 NOTE — Progress Notes (Signed)
Patient ambulated in room. O2 sats 98-99% on room air.

## 2016-10-08 NOTE — Progress Notes (Signed)
PHARMACIST - PHYSICIAN COMMUNICATION DR:   Verlon Au CONCERNING: IV to Oral Route Change Policy  RECOMMENDATION: This patient is receiving Keppra by the intravenous route.  Based on criteria approved by the Pharmacy and Therapeutics Committee, the intravenous medication(s) is/are being converted to the equivalent oral dose form(s).   DESCRIPTION: These criteria include:  The patient is eating (either orally or via tube) and/or has been taking other orally administered medications for a least 24 hours  The patient has no evidence of active gastrointestinal bleeding or impaired GI absorption (gastrectomy, short bowel, patient on TNA or NPO).  If you have questions about this conversion, please contact the Pharmacy Department  []   [phone] )  [name] []   [phone] )  [facility] [x]   [phone] )  [name] []   [phone] )  [facility] []   [phone] )  [facility]    Thank you for allowing pharmacy to be a part of this patient's care.  Alycia Rossetti, PharmD, BCPS Clinical Pharmacist Pager: [phone] 10/08/2016 12:11 PM

## 2016-10-09 LAB — CBC
HEMATOCRIT: 32 % — AB (ref 36.0–46.0)
Hemoglobin: 10.5 g/dL — ABNORMAL LOW (ref 12.0–15.0)
MCH: 26.7 pg (ref 26.0–34.0)
MCHC: 32.8 g/dL (ref 30.0–36.0)
MCV: 81.4 fL (ref 78.0–100.0)
PLATELETS: 243 10*3/uL (ref 150–400)
RBC: 3.93 MIL/uL (ref 3.87–5.11)
RDW: 14.2 % (ref 11.5–15.5)
WBC: 10.1 10*3/uL (ref 4.0–10.5)

## 2016-10-09 LAB — BASIC METABOLIC PANEL
Anion gap: 10 (ref 5–15)
BUN: 39 mg/dL — AB (ref 6–20)
CALCIUM: 9.2 mg/dL (ref 8.9–10.3)
CO2: 21 mmol/L — AB (ref 22–32)
Chloride: 106 mmol/L (ref 101–111)
Creatinine, Ser: 3 mg/dL — ABNORMAL HIGH (ref 0.44–1.00)
GFR calc Af Amer: 15 mL/min — ABNORMAL LOW (ref >60)
GFR, EST NON AFRICAN AMERICAN: 13 mL/min — AB (ref >60)
GLUCOSE: 101 mg/dL — AB (ref 65–99)
Potassium: 3.4 mmol/L — ABNORMAL LOW (ref 3.5–5.1)
Sodium: 137 mmol/L (ref 135–145)

## 2016-10-09 MED ORDER — ACETAMINOPHEN 325 MG PO TABS: 650.0000 mg | ORAL_TABLET | Freq: Four times a day (QID) | ORAL | Status: DC | PRN

## 2016-10-09 NOTE — Progress Notes (Signed)
Discharge instructions and medications discussed with patient.  Prescription given to patient.  All questions answered.  

## 2016-10-09 NOTE — Discharge Summary (Signed)
Physician Discharge Summary  [name] WLS:[id number] DOB: January 20, 1930 DOA: 10/06/2016  PCP: [name], MD  Admit date: 10/06/2016 Discharge date: 10/09/2016  Time spent: 35 minutes  Recommendations for Outpatient Follow-up:  1. Medications adjusted this admission-monitor labs as OP 2. Needs bmet and cbc 1 week 3. Will probably need OP renal input in the near future re: kidney function as well as potential need for work-up for dialysis 4. Consider Vascular referral for RAS although usually is medically managed  Discharge Diagnoses:   Principal Problem:   Acute kidney injury superimposed on chronic kidney disease ([place]) Active Problems:   Essential hypertension   Seizure disorder (HCC)   Nausea & vomiting   Anemia, iron deficiency   Coronary artery disease involving native coronary artery of native heart without angina pectoris   Metabolic acidosis   Dehydration with hyponatremia   Discharge Condition: fair  Diet recommendation: hh low salt  Filed Weights   10/06/16 2143 10/07/16 2121 10/08/16 2057  Weight: 72.4 kg (159 lb 9.8 oz) 72.2 kg (159 lb 2.8 oz) 72.8 kg (160 lb 9.6 oz)    History of present illness:  86 ? htn Chr LBBB Remote Stress 2005 PAS to RAS P Afib Chad2vasc2 not on AC Anemia Prior Sz disorder Chr sinus bradycardia  Recent seen Cardiology office-meds changed from HCTZ and ARBS stopped --> lasix as Creatinine had risen from 2.2-->3.2 in 6 mo Work-up FOr RAS was also undertaken as no real firm diagnosis RAS  Developed myalgia, nausea and AKI All BP meds held on admit  Hospital Course:  Acute kidney injury superimposed on chronic kidney disease stage IV/Metabolic acidosis -nausea, fever home, poor oral intake and now has developed emesis since arrival to the ER with renal function slightly worsened consistent with acute kidney injury secondary to apparent dehydration -Baseline renal function: 52/3.23--Current renal function: 60/3.9-->on  d/c was 39/3.0 - renal artery duplex scan suggestive of blockage-needs OP referral to vasc/INterventionalist -consider bicarb -rpt Magensium in am and if still low supplement  Nausea &vomiting? VIral illness vs UTI -Has associated fever without diarrhea whichbegan within a few hours of receiving influenza injection-suspect  influenza injection causing symptoms -rule out other viral etiology such as viral gastroenteritis -Flu PCR neg -NSIV 10/29 -tolerating full diet on d/c home  Essential hypertension/chronic diastolic heart failure grade 1 with severe hypertrophy of septal wall mild hypertrophy posterior wall left ventricle - recently cardiologist discontinued her hydrochlorothiazide as well as her ARB on 10/24 and started Lasix 80 mg daily which patient has been taking -clonidine was initally heldand she was started on Cardura 8 mg at night on 10/24 - discontinued lasix on d/c from hospital -will need OP follow up and adjustment of her meds  Seizure disorder  -Change home Keppra toIV route -reusme PO on d/c home  Anemia, iron deficiency Element of chronic renals diseas eas well as dilutional component -Hemoglobin is stable around 9-10 -Given chronic kidney disease may need to check erythropoietin level -hemoglobin has dropped to 8 -on d/c Hemoglobin 10  Coronary artery disease involving native coronary artery of native heart without angina pectoris -Currently asymptomatic -In setting of nausea and vomiting preadmission aspirin and Plavix are on hold -Not on beta blocker noting recent issues with bradycardia  Discharge Exam: Vitals:   10/08/16 2057 10/09/16 0407  BP: (!) 180/58 (!) 176/72  Pulse: 89 89  Resp: 19 18  Temp: 100.2 F (37.9 C) 99.6 F (37.6 C)    General: eomi ncat Cardiovascular: s1 s2  no m/r/g Respiratory: clear no added sound abd soft nt nd no rebound No le edema  Discharge Instructions   Discharge Instructions    Diet - low  sodium heart healthy    Complete by:  As directed    Increase activity slowly    Complete by:  As directed      Current Discharge Medication List    START taking these medications   Details  acetaminophen (TYLENOL) 325 MG tablet Take 2 tablets (650 mg total) by mouth every 6 (six) hours as needed for mild pain (or Fever >/= 101).      CONTINUE these medications which have NOT CHANGED   Details  amLODipine (NORVASC) 10 MG tablet Take 1 tablet (10 mg total) by mouth daily. Qty: 30 tablet, Refills: 11    aspirin EC 81 MG tablet Take 81 mg by mouth daily.    clopidogrel (PLAVIX) 75 MG tablet Take 75 mg by mouth daily.     colchicine (COLCRYS) 0.6 MG tablet Take 0.5 tablets (0.3 mg total) by mouth daily.    doxazosin (CARDURA) 8 MG tablet Take 1 tablet (8 mg total) by mouth daily. Qty: 30 tablet, Refills: 11    hydrALAZINE (APRESOLINE) 50 MG tablet Take 1 tablet (50 mg total) by mouth 3 (three) times daily. Qty: 90 tablet, Refills: 11    levETIRAcetam (KEPPRA) 500 MG tablet Take 500 mg by mouth 2 (two) times daily.    ondansetron (ZOFRAN) 4 MG tablet Take 4 mg by mouth every 8 (eight) hours as needed for nausea or vomiting.      STOP taking these medications     furosemide (LASIX) 80 MG tablet      triamcinolone cream (KENALOG) 0.5 %        Allergies  Allergen Reactions  . Haldol [Haloperidol Decanoate] Other (See Comments)    Agitation, aggressive and combative  . Lorazepam Other (See Comments)    Agitation, lethargic  . Cephalexin Other (See Comments)    REACTION: Questionable, no reaction listed      The results of significant diagnostics from this hospitalization (including imaging, microbiology, ancillary and laboratory) are listed below for reference.    Significant Diagnostic Studies: Dg Chest 2 View  Result Date: 10/06/2016 CLINICAL DATA:  Nausea, fever, chills for 2 days EXAM: CHEST  2 VIEW COMPARISON:  12/13/2015 FINDINGS: Large hiatal hernia. Mild  cardiomegaly. Lungs are clear. No effusions or acute bony abnormality. IMPRESSION: Large hiatal hernia and mild cardiomegaly.  No active disease. Electronically Signed   By: Rolm Baptise M.D.   On: 10/06/2016 13:20    Microbiology: Recent Results (from the past 240 hour(s))  Urine culture     Status: Abnormal   Collection Time: 10/06/16 11:20 PM  Result Value Ref Range Status   Specimen Description URINE, RANDOM  Final   Special Requests NONE  Final   Culture MULTIPLE SPECIES PRESENT, SUGGEST RECOLLECTION (A)  Final   Report Status 10/08/2016 FINAL  Final     Labs: Basic Metabolic Panel:  Recent Labs Lab 10/03/16 1045 10/06/16 1222 10/06/16 1230 10/06/16 1849 10/07/16 0430 10/08/16 0513 10/09/16 0546  NA 137 131* 132*  --  133* 137 137  K 4.2 4.6 4.6  --  4.1 3.4* 3.4*  CL 106 101 103  --  103 107 106  CO2 20 16*  --   --  18* 20* 21*  GLUCOSE 93 137* 134*  --  124* 104* 101*  BUN 52* 53* 60*  --  49* 42* 39*  CREATININE 3.23* 3.66* 3.90*  --  3.62* 3.28* 3.00*  CALCIUM 9.9 9.9  --   --  9.3 9.1 9.2  MG  --   --   --  1.3*  --  1.4*  --   PHOS  --   --   --  3.3  --   --   --    Liver Function Tests: No results for input(s): AST, ALT, ALKPHOS, BILITOT, PROT, ALBUMIN in the last 168 hours. No results for input(s): LIPASE, AMYLASE in the last 168 hours. No results for input(s): AMMONIA in the last 168 hours. CBC:  Recent Labs Lab 10/06/16 1222 10/06/16 1230 10/07/16 0430 10/09/16 0546  WBC 7.9  --  9.0 10.1  NEUTROABS 5.9  --   --   --   HGB 9.2* 10.2* 8.0* 10.5*  HCT 28.1* 30.0* 23.7* 32.0*  MCV 80.3  --  81.4 81.4  PLT 305  --  250 243   Cardiac Enzymes: No results for input(s): CKTOTAL, CKMB, CKMBINDEX, TROPONINI in the last 168 hours. BNP: BNP (last 3 results)  Recent Labs  10/06/16 1223  BNP 39.3    ProBNP (last 3 results) No results for input(s): PROBNP in the last 8760 hours.  CBG: No results for input(s): GLUCAP in the last 168  hours.     SignedNita Sells MD   [facility] 10/09/2016, 9:14 AM

## 2016-10-10 DIAGNOSIS — N289 Disorder of kidney and ureter, unspecified: Secondary | ICD-10-CM | POA: Diagnosis not present

## 2016-10-10 DIAGNOSIS — J00 Acute nasopharyngitis [common cold]: Secondary | ICD-10-CM | POA: Diagnosis not present

## 2016-10-10 DIAGNOSIS — N189 Chronic kidney disease, unspecified: Secondary | ICD-10-CM | POA: Diagnosis not present

## 2016-10-11 LAB — TYPE AND SCREEN
ABO/RH(D): A POS
Antibody Screen: NEGATIVE
Donor AG Type: NEGATIVE
UNIT DIVISION: 0
Unit division: 0

## 2016-10-12 ENCOUNTER — Inpatient Hospital Stay (HOSPITAL_COMMUNITY): Admission: RE | Admit: 2016-10-12 | Payer: Medicare Other | Source: Ambulatory Visit

## 2016-10-13 ENCOUNTER — Emergency Department (HOSPITAL_COMMUNITY)
Admission: EM | Admit: 2016-10-13 | Discharge: 2016-10-13 | Disposition: A | Discharge status: Home / Self Care | Payer: Medicare Other | Attending: Emergency Medicine | Admitting: Emergency Medicine

## 2016-10-13 ENCOUNTER — Encounter (HOSPITAL_COMMUNITY): Payer: Self-pay | Admitting: Emergency Medicine

## 2016-10-13 ENCOUNTER — Emergency Department (HOSPITAL_COMMUNITY): Payer: Medicare Other

## 2016-10-13 DIAGNOSIS — E876 Hypokalemia: Secondary | ICD-10-CM | POA: Diagnosis not present

## 2016-10-13 DIAGNOSIS — Z87891 Personal history of nicotine dependence: Secondary | ICD-10-CM | POA: Insufficient documentation

## 2016-10-13 DIAGNOSIS — I129 Hypertensive chronic kidney disease with stage 1 through stage 4 chronic kidney disease, or unspecified chronic kidney disease: Secondary | ICD-10-CM | POA: Diagnosis not present

## 2016-10-13 DIAGNOSIS — Z7982 Long term (current) use of aspirin: Secondary | ICD-10-CM | POA: Diagnosis not present

## 2016-10-13 DIAGNOSIS — Z7902 Long term (current) use of antithrombotics/antiplatelets: Secondary | ICD-10-CM | POA: Insufficient documentation

## 2016-10-13 DIAGNOSIS — R066 Hiccough: Secondary | ICD-10-CM | POA: Insufficient documentation

## 2016-10-13 DIAGNOSIS — N189 Chronic kidney disease, unspecified: Secondary | ICD-10-CM

## 2016-10-13 DIAGNOSIS — R0602 Shortness of breath: Secondary | ICD-10-CM | POA: Diagnosis not present

## 2016-10-13 DIAGNOSIS — Z96641 Presence of right artificial hip joint: Secondary | ICD-10-CM | POA: Insufficient documentation

## 2016-10-13 DIAGNOSIS — I2510 Atherosclerotic heart disease of native coronary artery without angina pectoris: Secondary | ICD-10-CM | POA: Diagnosis not present

## 2016-10-13 LAB — CBC WITH DIFFERENTIAL/PLATELET
BASOS ABS: 0 10*3/uL (ref 0.0–0.1)
Basophils Relative: 0 %
EOS ABS: 0 10*3/uL (ref 0.0–0.7)
EOS PCT: 0 %
HCT: 31.4 % — ABNORMAL LOW (ref 36.0–46.0)
Hemoglobin: 10.5 g/dL — ABNORMAL LOW (ref 12.0–15.0)
Lymphocytes Relative: 15 %
Lymphs Abs: 1.5 10*3/uL (ref 0.7–4.0)
MCH: 27.3 pg (ref 26.0–34.0)
MCHC: 33.4 g/dL (ref 30.0–36.0)
MCV: 81.8 fL (ref 78.0–100.0)
MONO ABS: 0.7 10*3/uL (ref 0.1–1.0)
Monocytes Relative: 7 %
Neutro Abs: 7.8 10*3/uL — ABNORMAL HIGH (ref 1.7–7.7)
Neutrophils Relative %: 78 %
PLATELETS: 233 10*3/uL (ref 150–400)
RBC: 3.84 MIL/uL — AB (ref 3.87–5.11)
RDW: 14.5 % (ref 11.5–15.5)
WBC: 10 10*3/uL (ref 4.0–10.5)

## 2016-10-13 LAB — COMPREHENSIVE METABOLIC PANEL
ALBUMIN: 3.5 g/dL (ref 3.5–5.0)
ALT: 17 U/L (ref 14–54)
AST: 20 U/L (ref 15–41)
Alkaline Phosphatase: 65 U/L (ref 38–126)
Anion gap: 13 (ref 5–15)
BUN: 43 mg/dL — AB (ref 6–20)
CHLORIDE: 99 mmol/L — AB (ref 101–111)
CO2: 18 mmol/L — AB (ref 22–32)
Calcium: 8.6 mg/dL — ABNORMAL LOW (ref 8.9–10.3)
Creatinine, Ser: 3.38 mg/dL — ABNORMAL HIGH (ref 0.44–1.00)
GFR calc Af Amer: 13 mL/min — ABNORMAL LOW (ref >60)
GFR, EST NON AFRICAN AMERICAN: 12 mL/min — AB (ref >60)
Glucose, Bld: 110 mg/dL — ABNORMAL HIGH (ref 65–99)
POTASSIUM: 3.1 mmol/L — AB (ref 3.5–5.1)
SODIUM: 130 mmol/L — AB (ref 135–145)
Total Bilirubin: 0.8 mg/dL (ref 0.3–1.2)
Total Protein: 5.9 g/dL — ABNORMAL LOW (ref 6.5–8.1)

## 2016-10-13 LAB — URINE MICROSCOPIC-ADD ON: RBC / HPF: NONE SEEN RBC/hpf (ref 0–5)

## 2016-10-13 LAB — URINALYSIS, ROUTINE W REFLEX MICROSCOPIC
Bilirubin Urine: NEGATIVE
Glucose, UA: NEGATIVE mg/dL
HGB URINE DIPSTICK: NEGATIVE
Ketones, ur: NEGATIVE mg/dL
Leukocytes, UA: NEGATIVE
Nitrite: NEGATIVE
PH: 5.5 (ref 5.0–8.0)
Protein, ur: 100 mg/dL — AB
SPECIFIC GRAVITY, URINE: 1.015 (ref 1.005–1.030)

## 2016-10-13 LAB — I-STAT TROPONIN, ED: TROPONIN I, POC: 0.03 ng/mL (ref 0.00–0.08)

## 2016-10-13 MED ORDER — POTASSIUM CHLORIDE CRYS ER 20 MEQ PO TBCR: 20.0000 meq | EXTENDED_RELEASE_TABLET | Freq: Every day | ORAL | 0 refills | Status: DC

## 2016-10-13 MED ORDER — SODIUM CHLORIDE 0.9 % IV BOLUS (SEPSIS)
1000.0000 mL | Freq: Once | INTRAVENOUS | Status: AC
  Administered 2016-10-13: 1000 mL via INTRAVENOUS

## 2016-10-13 MED ORDER — METOCLOPRAMIDE HCL 5 MG/ML IJ SOLN
10.0000 mg | Freq: Once | INTRAMUSCULAR | Status: AC
  Administered 2016-10-13: 10 mg via INTRAVENOUS
  Filled 2016-10-13: qty 2

## 2016-10-13 MED ORDER — DIPHENHYDRAMINE HCL 50 MG/ML IJ SOLN
12.5000 mg | Freq: Once | INTRAMUSCULAR | Status: AC
  Administered 2016-10-13: 12.5 mg via INTRAVENOUS
  Filled 2016-10-13: qty 1

## 2016-10-13 MED ORDER — POTASSIUM CHLORIDE CRYS ER 20 MEQ PO TBCR
40.0000 meq | EXTENDED_RELEASE_TABLET | Freq: Once | ORAL | Status: AC
  Administered 2016-10-13: 40 meq via ORAL
  Filled 2016-10-13: qty 2

## 2016-10-13 NOTE — ED Notes (Signed)
Pt wheeled to vehicle for family to transport home.  Verbalizes understanding of discharge instructions. NAD. Ambulatory with steady gait.

## 2016-10-13 NOTE — ED Provider Notes (Signed)
Patient's care signed out to reassess after treatment for hiccups. Patient had blood work which is overall similar to previous since discharge however potassium mild low. Oral potassium given. On reassessment patient well-appearing feels fine he comes controlled. Discussed importance of follow-up for potassium and kidney function early next week.  Arius Harnois, Paula Compton, MD 10/13/16 [phone]

## 2016-10-13 NOTE — ED Notes (Signed)
Wheeled pt back to restroom from waiting room.

## 2016-10-13 NOTE — ED Notes (Signed)
This RN attempted IV access, unsuccessful. Will have second RN attempt. Patient transferring to xray at this time.

## 2016-10-13 NOTE — ED Provider Notes (Signed)
[facility] DEPT Provider Note   CSN: [id number] Arrival date & time: 10/13/16  1222  History   Chief Complaint Chief Complaint  Patient presents with  . Hiccups   HPI [name] is a 82 y.o. female presenting with hiccups.  HPI  Recently discharged from hospital on Monday (10/09/16) and reports she has had hiccups since discharge. Was admitted for acute on chronic renal disease, suspected to be secondary to dehydration. Reports since discharge she has had a general feeling of weakness and has developed a sore throat. Reports she is not eating or drinking much secondary to her sore throat. Hiccups have been constant and are keeping her from sleeping at night. Family reports some difficulty with ambulating, stating her family has been helping her get around her house. Denies fever, nausea, vomiting, diarrhea, abdominal pain.   Past Medical History:  Diagnosis Date  . Anxiety   . AVM (arteriovenous malformation) of colon   . CAD (coronary artery disease)   . Cervical polyp   . Chronic kidney disease   . Diverticulosis   . Esophageal stricture   . GERD (gastroesophageal reflux disease)   . Hiatal hernia   . Hypertension   . Iron deficiency anemia   . Osteoarthritis   . Seizures ([place])   . Tubular adenoma of colon 2014  . Ventricular hypertrophy    Patient Active Problem List   Diagnosis Date Noted  . Acute kidney injury superimposed on chronic kidney disease ([place]) 10/06/2016  . Metabolic acidosis 42/70/6237  . Dehydration with hyponatremia 10/06/2016  . Sinus bradycardia 10/03/2016  . Hypokalemia 12/10/2015  . DNR (do not resuscitate)   . Palliative care encounter   . Weakness   . Hyperkalemia 12/09/2015  . Hypernatremia 12/09/2015  . Troponin level elevated 12/09/2015  . Anemia, iron deficiency 12/09/2015  . Coronary artery disease involving native coronary artery of native heart without angina pectoris 12/09/2015  . Nausea & vomiting 12/08/2015  . Seizure  disorder ([place]) 02/05/2008  . Essential hypertension 06/22/2007    Past Surgical History:  Procedure Laterality Date  . ANGIOPLASTY  1999  . Bilateral Total Knee Replacements     Left Knee-12/29/2003, Right Knee-04/10/2002  . BREAST SURGERY    . CARDIAC CATHETERIZATION    . CARPAL TUNNEL RELEASE    . CATARACT EXTRACTION    . COLONOSCOPY    . COLONOSCOPY W/ BIOPSIES    . CORONARY ANGIOPLASTY    . INGUINAL HERNIA REPAIR  08/08/2006   RIGHT INGUINAL HERNIA REPAIR WITH MESH  . JOINT REPLACEMENT    . KNEE ARTHROSCOPY    . LAPAROSCOPIC APPENDECTOMY    . LAPAROSCOPIC CHOLECYSTECTOMY    . MAJOR DUCT EXCISION OF LEFT BREAST  06/01/2004  . RIGHT SHOULDER ARTHROSCOPY  12/18/2006  . Right Total Hip Athroplasty  01/23/2002  . TONSILLECTOMY      OB History    No data available       Home Medications    Prior to Admission medications   Medication Sig Start Date End Date Taking? Authorizing Provider  acetaminophen (TYLENOL) 325 MG tablet Take 2 tablets (650 mg total) by mouth every 6 (six) hours as needed for mild pain (or Fever >/= 101). 10/09/16   Nita Sells, MD  amLODipine (NORVASC) 10 MG tablet Take 1 tablet (10 mg total) by mouth daily. 10/04/16 01/02/17  Thayer Headings, MD  aspirin EC 81 MG tablet Take 81 mg by mouth daily.    Historical Provider, MD  clopidogrel (PLAVIX) 75  MG tablet Take 75 mg by mouth daily.  08/21/14   Historical Provider, MD  colchicine (COLCRYS) 0.6 MG tablet Take 0.5 tablets (0.3 mg total) by mouth daily. Patient taking differently: Take 0.6 mg by mouth daily.  12/14/15   Modena Jansky, MD  doxazosin (CARDURA) 8 MG tablet Take 1 tablet (8 mg total) by mouth daily. 10/03/16   Thayer Headings, MD  hydrALAZINE (APRESOLINE) 50 MG tablet Take 1 tablet (50 mg total) by mouth 3 (three) times daily. 10/04/16 01/02/17  Thayer Headings, MD  levETIRAcetam (KEPPRA) 500 MG tablet Take 500 mg by mouth 2 (two) times daily.    Historical Provider, MD  ondansetron (ZOFRAN)  4 MG tablet Take 4 mg by mouth every 8 (eight) hours as needed for nausea or vomiting.    Historical Provider, MD    Family History Family History  Problem Relation Age of Onset  . Hypertension Mother   . Stroke Mother   . Cancer Father   . Cancer Sister   . Other Brother     brain tumor    Social History Social History  Substance Use Topics  . Smoking status: Former Research scientist (life sciences)  . Smokeless tobacco: Never Used  . Alcohol use No     Allergies   Haldol [haloperidol decanoate]; Lorazepam; and Cephalexin   Review of Systems Review of Systems  Constitutional: Positive for appetite change. Negative for fever.  HENT: Positive for sore throat.   Respiratory: Negative for cough, shortness of breath and wheezing.   Cardiovascular: Negative for chest pain and palpitations.  Gastrointestinal: Negative for abdominal pain, constipation, diarrhea, nausea and vomiting.     Physical Exam Updated Vital Signs BP 128/58 (BP Location: Left Arm)   Pulse 90   Temp 98.8 F (37.1 C) (Oral)   Resp 22   SpO2 98%   Physical Exam  Constitutional: She appears well-developed and well-nourished.  HENT:  Head: Normocephalic and atraumatic.  Mild erythema of oropharynx, no exudates  Cardiovascular: Normal rate.  Exam reveals no friction rub.   Murmur heard. Pulmonary/Chest: Effort normal. No respiratory distress. She has no wheezes.  Abdominal: Soft. Bowel sounds are normal. She exhibits no distension. There is no tenderness.  Musculoskeletal: She exhibits no edema.  Lymphadenopathy:    She has no cervical adenopathy.  Skin: No rash noted.  Psychiatric: She has a normal mood and affect. Her behavior is normal.     ED Treatments / Results  Labs (all labs ordered are listed, but only abnormal results are displayed) Labs Reviewed - No data to display  EKG  EKG Interpretation None       Radiology No results found.  Procedures Procedures (including critical care  time)  Medications Ordered in ED Medications - No data to display   Initial Impression / Assessment and Plan / ED Course  I have reviewed the triage vital signs and the nursing notes.  Pertinent labs & imaging results that were available during my care of the patient were reviewed by me and considered in my medical decision making (see chart for details).  Clinical Course  - Troponin negative at 0.03 - CBC with mild anemia, hemoglobin 10.5, stable with prior - Urinalysis with 6-30 epithelial cells, cloudy, otherwise negative.  - CXR with hiatal hernia, no vascular congestion, no infiltrates.  - CMP pending  - 1L NS bolus given. Reglan and Benadryl given.   Final Clinical Impressions(s) / ED Diagnoses   Final diagnoses:  None   Signed  out to Dr. Reather Converse to take over ED Care at change of shift.   New Prescriptions New Prescriptions   No medications on file     Lorna Few, [place] 10/13/16 [place] Yao, MD 10/16/16 1029

## 2016-10-13 NOTE — Discharge Instructions (Signed)
Stay well hydrated.  If you were given medicines take as directed.  If you are on coumadin or contraceptives realize their levels and effectiveness is altered by many different medicines.  If you have any reaction (rash, tongues swelling, other) to the medicines stop taking and see a physician.    If your blood pressure was elevated in the ER make sure you follow up for management with a primary doctor or return for chest pain, shortness of breath or stroke symptoms. Have kidney function checked next week.   Please follow up as directed and return to the ER or see a physician for new or worsening symptoms.  Thank you. Vitals:   10/13/16 1229 10/13/16 1533  BP: 128/58 124/75  Pulse: 90 83  Resp: 22   Temp: 98.8 F (37.1 C)   TempSrc: Oral   SpO2: 98% 100%

## 2016-10-13 NOTE — ED Triage Notes (Addendum)
Pt reports being discharged from hospital this past Monday and has had hiccups since.  Pt states she saw her PCP with no dx but is unable to sleep.  Pt reports weakness but is is not worse than it has been.  NAD, A&O.

## 2016-10-17 ENCOUNTER — Encounter (HOSPITAL_COMMUNITY): Payer: Self-pay

## 2016-10-17 ENCOUNTER — Ambulatory Visit (HOSPITAL_COMMUNITY)
Admission: RE | Admit: 2016-10-17 | Discharge: 2016-10-17 | Disposition: A | Discharge status: Home / Self Care | Payer: Medicare Other | Source: Ambulatory Visit | Attending: Cardiovascular Disease | Admitting: Cardiovascular Disease

## 2016-10-17 DIAGNOSIS — I10 Essential (primary) hypertension: Secondary | ICD-10-CM

## 2016-10-17 DIAGNOSIS — I701 Atherosclerosis of renal artery: Secondary | ICD-10-CM

## 2016-10-19 DIAGNOSIS — Z6831 Body mass index (BMI) 31.0-31.9, adult: Secondary | ICD-10-CM | POA: Diagnosis not present

## 2016-10-19 DIAGNOSIS — I10 Essential (primary) hypertension: Secondary | ICD-10-CM | POA: Diagnosis not present

## 2016-10-19 DIAGNOSIS — N189 Chronic kidney disease, unspecified: Secondary | ICD-10-CM | POA: Diagnosis not present

## 2016-10-19 DIAGNOSIS — H6120 Impacted cerumen, unspecified ear: Secondary | ICD-10-CM | POA: Diagnosis not present

## 2016-10-25 ENCOUNTER — Other Ambulatory Visit: Payer: Self-pay | Admitting: Family Medicine

## 2016-10-25 DIAGNOSIS — N183 Chronic kidney disease, stage 3 unspecified: Secondary | ICD-10-CM

## 2016-10-27 ENCOUNTER — Ambulatory Visit
Admission: RE | Admit: 2016-10-27 | Discharge: 2016-10-27 | Disposition: A | Discharge status: Home / Self Care | Payer: Medicare Other | Source: Ambulatory Visit | Attending: Family Medicine | Admitting: Family Medicine

## 2016-10-27 DIAGNOSIS — N183 Chronic kidney disease, stage 3 unspecified: Secondary | ICD-10-CM

## 2016-11-01 ENCOUNTER — Ambulatory Visit: Payer: Medicare Other

## 2016-11-06 ENCOUNTER — Ambulatory Visit: Payer: Medicare Other | Admitting: Physician Assistant

## 2016-11-07 DIAGNOSIS — N184 Chronic kidney disease, stage 4 (severe): Secondary | ICD-10-CM | POA: Diagnosis not present

## 2016-11-14 ENCOUNTER — Telehealth: Payer: Self-pay | Admitting: Physician Assistant

## 2016-11-14 ENCOUNTER — Encounter: Payer: Self-pay | Admitting: Physician Assistant

## 2016-11-14 ENCOUNTER — Ambulatory Visit (INDEPENDENT_AMBULATORY_CARE_PROVIDER_SITE_OTHER): Payer: Medicare Other | Admitting: Physician Assistant

## 2016-11-14 VITALS — BP 130/70 | HR 76 | Ht 60.0 in | Wt 154.0 lb

## 2016-11-14 DIAGNOSIS — I10 Essential (primary) hypertension: Secondary | ICD-10-CM

## 2016-11-14 DIAGNOSIS — I701 Atherosclerosis of renal artery: Secondary | ICD-10-CM

## 2016-11-14 DIAGNOSIS — I739 Peripheral vascular disease, unspecified: Secondary | ICD-10-CM | POA: Diagnosis not present

## 2016-11-14 DIAGNOSIS — I422 Other hypertrophic cardiomyopathy: Secondary | ICD-10-CM

## 2016-11-14 DIAGNOSIS — N189 Chronic kidney disease, unspecified: Secondary | ICD-10-CM

## 2016-11-14 DIAGNOSIS — D649 Anemia, unspecified: Secondary | ICD-10-CM | POA: Diagnosis not present

## 2016-11-14 LAB — BASIC METABOLIC PANEL
BUN: 31 mg/dL — AB (ref 7–25)
CHLORIDE: 101 mmol/L (ref 98–110)
CO2: 21 mmol/L (ref 20–31)
CREATININE: 3.06 mg/dL — AB (ref 0.60–0.88)
Calcium: 9 mg/dL (ref 8.6–10.4)
Glucose, Bld: 109 mg/dL — ABNORMAL HIGH (ref 65–99)
POTASSIUM: 3 mmol/L — AB (ref 3.5–5.3)
Sodium: 139 mmol/L (ref 135–146)

## 2016-11-14 NOTE — Telephone Encounter (Signed)
Pt aware of recommendation per Richardson Dopp, PA to refer pt to Dr. Fletcher Anon. Pt agreeable to Dr. Fletcher Anon for further evaluation of renal artery stenosis per Renal Ultrasound done with Dr. Erling Cruz, Nephrology. I advised pt that I will have [facility] call her with an appt to see Dr. Fletcher Anon. Pt advised as well that she only needs to take Plavix and not Aspirin as well. Pt aware to d/c ASA as of today however, continue the Plavix. Pt agreeable to plan of care and all instructions.

## 2016-11-14 NOTE — Progress Notes (Signed)
Cardiology Office Note:    Date:  11/14/2016   ID:  [name], DOB 10-05-1930, MRN [id number]  PCP:  [name], MD  Cardiologist:  Dr. Liam Rogers   Electrophysiologist:  n/a Nephrologist: Dr. Florene Glen  Referring MD: Lucianne Lei, MD   Chief Complaint  Patient presents with  . Hospitalization Follow-up    HTN, worsening CKD    History of Present Illness:    [name] is a 82 y.o. female (prior patient of mine from [place]) with a hx of LBBB, remote stress testing in 2006 that was normal, HTN, CKD, PAD with renal artery stenosis (R renal artery angioplasty in 1999), reported parox AFib (no real documentation), iron deficiency anemia, seizure d/o, prior GI bleed and prior subdural hematoma.  She was admitted in 12/16 for a/c renal failure and assoc hyperkalemia and mildly elevated Troponin levels (due to demand ischemia).  ARB and Spironolactone were DC'd.    Last seen by Dr. Liam Rogers in 10/17.  Her HR was low in the 50s and she was tapered off of clonidine.  Renal artery duplex was arranged to assess for recurrent RAS.  Labs returned with higher Creatinine and her Olmesartan-HCTZ was DC'd.  Amlodipine and Lasix was started and her Hydralazine was increased.     She was then admitted 10/27-10/30 with acute on chronic renal failure in the setting of a viral illness with n/v, fever and poor oral intake.  SCr increased to 3.9 but improved to 3.0 at DC.  RA ultrasound demonstrated a patent R renal artery but the L renal artery was technically difficult to obtain arterial flow and occlusion could not be excluded.  OP follow up for RAS was recommended.   She went back to the ED with hiccups and weakness on 10/13/16.  She was hydrated with IVFs.  She was given potassium supplementation due to low potassium.  Her Troponin levels were normal.  She is here alone today. Since DC from the hospital, she has been doing well. She denies chest discomfort or significant dyspnea. She denies  orthopnea, PND.  She does have occasional ankle edema which seems to be dependent. She denies dizziness or syncope. She denies any bleeding issues.  Prior CV studies that were reviewed today include:    Renal Artery Duplex 10/07/16 Findings are suggestive of no obvious right renal artery significant stenosis. The left renal artery was technically difficult to obtain arterial flow, cannot exclude renal artery occlusion. Bilateral kidneys, increased echogenicity with the left kidney measuring smaller than the right. Abnormal intrarenal resistive index on right.  Echo 12/24/15 Mild LVH of post wall and severe LVH of septal wall, EF 60-65, no RWMA, Gr 1 DD, mod LAE.  Septal thickness 16.6 mm.  LVOT peak gradient 6 mmHg.  Myoview 5/06 No ischemia; EF 87; Normal  Renal arteriography 2/03 Patent R RA (30% stenosis); no L RA stenosis  Past Medical History:  Diagnosis Date  . Anxiety   . AVM (arteriovenous malformation) of colon   . CAD (coronary artery disease)   . Cervical polyp   . Chronic kidney disease   . Diverticulosis   . Esophageal stricture   . GERD (gastroesophageal reflux disease)   . Hiatal hernia   . Hypertension   . Iron deficiency anemia   . Osteoarthritis   . Seizures ([place])   . Tubular adenoma of colon 2014  . Ventricular hypertrophy     Past Surgical History:  Procedure Laterality Date  . ANGIOPLASTY  1999  .  Bilateral Total Knee Replacements     Left Knee-12/29/2003, Right Knee-04/10/2002  . BREAST SURGERY    . CARDIAC CATHETERIZATION    . CARPAL TUNNEL RELEASE    . CATARACT EXTRACTION    . COLONOSCOPY    . COLONOSCOPY W/ BIOPSIES    . CORONARY ANGIOPLASTY    . INGUINAL HERNIA REPAIR  08/08/2006   RIGHT INGUINAL HERNIA REPAIR WITH MESH  . JOINT REPLACEMENT    . KNEE ARTHROSCOPY    . LAPAROSCOPIC APPENDECTOMY    . LAPAROSCOPIC CHOLECYSTECTOMY    . MAJOR DUCT EXCISION OF LEFT BREAST  06/01/2004  . RIGHT SHOULDER ARTHROSCOPY  12/18/2006  . Right Total Hip  Athroplasty  01/23/2002  . TONSILLECTOMY      Current Medications: Current Meds  Medication Sig  . amLODipine (NORVASC) 10 MG tablet Take 1 tablet (10 mg total) by mouth daily.  Marland Kitchen aspirin EC 81 MG tablet Take 81 mg by mouth daily.  . calcitRIOL (ROCALTROL) 0.25 MCG capsule Take 0.25 mcg by mouth daily.   . clopidogrel (PLAVIX) 75 MG tablet Take 75 mg by mouth daily.   . colchicine 0.6 MG tablet TAKE 0.5 TABLETS (0.3 TOTAL) BY MOUTH DAILY  . doxazosin (CARDURA) 8 MG tablet Take 1 tablet (8 mg total) by mouth daily.  . hydrALAZINE (APRESOLINE) 50 MG tablet Take 1 tablet (50 mg total) by mouth 3 (three) times daily.  Marland Kitchen levETIRAcetam (KEPPRA) 500 MG tablet Take 500 mg by mouth 2 (two) times daily.  . potassium chloride (K-DUR,KLOR-CON) 10 MEQ tablet Take 10 mEq by mouth 2 (two) times daily.     Allergies:   Haldol [haloperidol decanoate]; Lorazepam; and Cephalexin   Social History   Social History  . Marital status: Widowed    Spouse name: N/A  . Number of children: N/A  . Years of education: N/A   Social History Main Topics  . Smoking status: Former Research scientist (life sciences)  . Smokeless tobacco: Never Used  . Alcohol use No  . Drug use: No  . Sexual activity: No   Other Topics Concern  . None   Social History Narrative  . None     Family History:  The patient's family history includes Cancer in her father and sister; Hypertension in her mother; Other in her brother; Stroke in her mother.   ROS:   Please see the history of present illness.    Review of Systems  HENT: Positive for hearing loss.   Cardiovascular: Positive for leg swelling.  Hematologic/Lymphatic: Bruises/bleeds easily.  Musculoskeletal: Positive for joint swelling.  Neurological: Positive for loss of balance.   All other systems reviewed and are negative.   EKGs/Labs/Other Test Reviewed:    EKG:  EKG is  ordered today.  The ekg ordered today demonstrates  NSR, HR 76, LBBB  Recent Labs: 10/03/2016: TSH  1.83 10/06/2016: B Natriuretic Peptide 39.3 10/08/2016: Magnesium 1.4 10/13/2016: ALT 17; BUN 43; Creatinine, Ser 3.38; Hemoglobin 10.5; Platelets 233; Potassium 3.1; Sodium 130   Labs from Nephrologist 11/07/16: BUN 36, Creatinine 2.88, K 3.1, Hgb 10.6.  Physical Exam:    VS:  BP 130/70   Pulse 76   Ht 5' (1.524 m)   Wt 154 lb (69.9 kg)   BMI 30.08 kg/m     Wt Readings from Last 3 Encounters:  11/14/16 154 lb (69.9 kg)  10/08/16 160 lb 9.6 oz (72.8 kg)  10/03/16 160 lb (72.6 kg)     Physical Exam  Constitutional: She is oriented to person,  place, and time. She appears well-developed and well-nourished. No distress.  HENT:  Head: Normocephalic and atraumatic.  Eyes: No scleral icterus.  Neck: No JVD present.  Cardiovascular: Normal rate, regular rhythm, S1 normal and S2 normal.   Murmur heard.  Medium-pitched systolic murmur is present with a grade of 2/6  at the upper left sternal border Pulmonary/Chest: Effort normal. She has no wheezes. She has no rales.  Abdominal: Soft. There is no tenderness.  Musculoskeletal: She exhibits edema.  Trace ankle edema bilaterally  Neurological: She is alert and oriented to person, place, and time.  Skin: Skin is warm and dry.  Psychiatric: She has a normal mood and affect.    ASSESSMENT:    1. Essential hypertension   2. Chronic kidney disease, unspecified CKD stage   3. PAD (peripheral artery disease) ([place])   4. Anemia, unspecified type   5. Hypertrophic cardiomyopathy ([place])    PLAN:    In order of problems listed above:  1. HTN - Her blood pressure is much better controlled on her current regimen. Continue current therapy.  2. CKD - She is followed by Dr. Florene Glen. Recent creatinine was 2.88.  She is now on potassium supplementation.  Repeat BMET today.  3. PAD - She has a hx of RAS s/p angioplasty to the R RA in the 1990s.  She had an [place] in the hospital that was technically difficult on the L but occlusion could not be ruled  out.  She had a FU ultrasound with nephrology since DC, but this was just a renal ultrasound.  I will refer to Dr. Kathlyn Sacramento for further recommendations regarding her RAS.  Since it is unilateral, I suspect she will not need any intervention.  However, since her BP has been difficult to control and her renal function is worse, it is worth evaluating further.  4. Anemia - Suspect related to chronic disease.  I cannot find why she is on both ASA and Plavix.  I did research her chart after the patient left the office. I cannot find a clear reason as to why she is on both medications. There was a mention in a discharge summary in 2011 about changing aspirin to Plavix due to suspected TIA symptoms. She does have a prior history of diverticular bleeding. Her bleeding risk is elevated on both aspirin and Plavix.  Therefore, I will stop her aspirin and continue her on Plavix.  5. Hypertrophic CM - She has severe septal thickening.  No significant LVOT gradient noted on most recent echo.  BP is controlled.  She is not on beta-blocker due to hx of bradycardia.  Avoid dehydration.     Medication Adjustments/Labs and Tests Ordered: Current medicines are reviewed at length with the patient today.  Concerns regarding medicines are outlined above.  Medication changes, Labs and Tests ordered today are outlined in the Patient Instructions noted below. Patient Instructions  Medication Instructions:  Your physician recommends that you continue on your current medications as directed. Please refer to the Current Medication list given to you today.  Labwork: BMET TODAY   Testing/Procedures: NONE  Follow-Up: [name] IN 3 MONTHS   Any Other Special Instructions Will Be Listed Below (If Applicable).  If you need a refill on your cardiac medications before your next appointment, please call your pharmacy.  Signed, Richardson Dopp, PA-C  11/14/2016 3:13 PM    [facility] [address], [place], [place]  [address] Phone: [phone]; Fax: [phone]    

## 2016-11-14 NOTE — Patient Instructions (Addendum)
Medication Instructions:  Your physician recommends that you continue on your current medications as directed. Please refer to the Current Medication list given to you today.  Labwork: BMET TODAY   Testing/Procedures: NONE  Follow-Up: [name] IN 3 MONTHS   Any Other Special Instructions Will Be Listed Below (If Applicable).  If you need a refill on your cardiac medications before your next appointment, please call your pharmacy.

## 2016-11-14 NOTE — Telephone Encounter (Signed)
1. Please tell [name] that I received her records from Dr. Florene Glen.   I need to refer her to Dr. Kathlyn Sacramento for further evaluation of renal artery stenosis.  Please arrange.  2. Also, I searched through her chart and cannot find a clear reason as to why she is on both ASA and Plavix.  She has a hx of GI bleed.  There was a question of a TIA in 2011 and a mention of considering changing ASA to Plavix. I recommend that she stop ASA and remain on Plavix. Richardson Dopp, PA-C   11/14/2016 3:11 PM

## 2016-11-14 NOTE — Addendum Note (Signed)
Addended by: Michae Kava on: 11/14/2016 03:30 PM   Modules accepted: Orders

## 2016-11-15 ENCOUNTER — Telehealth: Payer: Self-pay | Admitting: *Deleted

## 2016-11-15 MED ORDER — POTASSIUM CHLORIDE ER 10 MEQ PO TBCR
20.0000 meq | EXTENDED_RELEASE_TABLET | Freq: Two times a day (BID) | ORAL | 3 refills | Status: DC
Start: 2016-11-15 — End: 2017-08-04

## 2016-11-15 NOTE — Telephone Encounter (Signed)
Pt notified of lab results and findings by phone. Pt agreeable to increase K+ to 20 meq BID. Advised pt she will need repeat lab work in 1 week, bmet, magnesium level. Pt states she has an appt 12/14 with Nephrology Dr. Florene Glen office for lab work and would like to just get it all done there if that was ok. I advised that will be fine and I will contact Dr. Abel Presto office tomorrow to ask if they would please draw a BMET, Magnesium level 12/14 when pt comes in. Pt said thank you .

## 2016-11-23 DIAGNOSIS — N183 Chronic kidney disease, stage 3 (moderate): Secondary | ICD-10-CM | POA: Diagnosis not present

## 2016-12-05 ENCOUNTER — Ambulatory Visit
Admission: RE | Admit: 2016-12-05 | Discharge: 2016-12-05 | Disposition: A | Discharge status: Home / Self Care | Payer: Medicare Other | Source: Ambulatory Visit | Attending: Family Medicine | Admitting: Family Medicine

## 2016-12-05 DIAGNOSIS — Z1231 Encounter for screening mammogram for malignant neoplasm of breast: Secondary | ICD-10-CM | POA: Diagnosis not present

## 2016-12-26 ENCOUNTER — Ambulatory Visit (INDEPENDENT_AMBULATORY_CARE_PROVIDER_SITE_OTHER): Payer: Medicare Other | Admitting: Cardiovascular Disease

## 2016-12-26 ENCOUNTER — Encounter: Payer: Self-pay | Admitting: Cardiovascular Disease

## 2016-12-26 VITALS — BP 148/70 | HR 74 | Ht 60.0 in | Wt 154.0 lb

## 2016-12-26 DIAGNOSIS — I701 Atherosclerosis of renal artery: Secondary | ICD-10-CM

## 2016-12-26 DIAGNOSIS — I10 Essential (primary) hypertension: Secondary | ICD-10-CM

## 2016-12-26 DIAGNOSIS — I739 Peripheral vascular disease, unspecified: Secondary | ICD-10-CM

## 2016-12-26 NOTE — Patient Instructions (Signed)

## 2016-12-26 NOTE — Progress Notes (Signed)
Cardiology Office Note   Date:  12/26/2016   ID:  [name], DOB 10-14-1930, MRN [id number]  PCP:  [name], MD  Cardiologist:  [name]  No chief complaint on file.     History of Present Illness: [name] is a 83 y.o. female who was referred by Richardson Dopp for evaluation of possible renal artery stenosis. She has known history of left bundle branch block, hypertension, chronic kidney disease, renal artery angioplasty in 1999, reported paroxysmal atrial fibrillation, and deficiency anemia, seizure disorder, history of prior GI bleed and prior subdural hematoma. The patient has chronic kidney disease with gradual decline in renal function. Interestingly, her renal function was normal in 2011. In 2014 creatinine increased to 1.3 and subsequently to 1.43. In 2015, her creatinine was 1.5. In early 2016, her creatinine increased to 1.66. She was hospitalized in December 2016 with acute on chronic renal failure with a creatinine of 4.56. The patient at that time had poor oral intake and she was thought to be volume depleted. Renal function improved with hydration but never went back to baseline. Most recent creatinine in December 2017 was 3.06. She is not diabetic and is not a smoker. She has no family history of coronary artery disease. She had renal artery duplex in October 2017 which showed no significant right renal artery stenosis. The left renal artery was not visualized and was possibly occluded. The left kidney was smaller than the right kidney.   Past Medical History:  Diagnosis Date  . Anxiety   . AVM (arteriovenous malformation) of colon   . CAD (coronary artery disease)   . Cervical polyp   . Chronic kidney disease   . Diverticulosis   . Esophageal stricture   . GERD (gastroesophageal reflux disease)   . Hiatal hernia   . Hypertension   . Iron deficiency anemia   . Osteoarthritis   . Seizures ([place])   . Tubular adenoma of colon 2014  . Ventricular  hypertrophy     Past Surgical History:  Procedure Laterality Date  . ANGIOPLASTY  1999  . Bilateral Total Knee Replacements     Left Knee-12/29/2003, Right Knee-04/10/2002  . BREAST SURGERY    . CARDIAC CATHETERIZATION    . CARPAL TUNNEL RELEASE    . CATARACT EXTRACTION    . COLONOSCOPY    . COLONOSCOPY W/ BIOPSIES    . CORONARY ANGIOPLASTY    . INGUINAL HERNIA REPAIR  08/08/2006   RIGHT INGUINAL HERNIA REPAIR WITH MESH  . JOINT REPLACEMENT    . KNEE ARTHROSCOPY    . LAPAROSCOPIC APPENDECTOMY    . LAPAROSCOPIC CHOLECYSTECTOMY    . MAJOR DUCT EXCISION OF LEFT BREAST  06/01/2004  . RIGHT SHOULDER ARTHROSCOPY  12/18/2006  . Right Total Hip Athroplasty  01/23/2002  . TONSILLECTOMY       Current Outpatient Prescriptions  Medication Sig Dispense Refill  . amLODipine (NORVASC) 10 MG tablet Take 1 tablet (10 mg total) by mouth daily. 30 tablet 11  . calcitRIOL (ROCALTROL) 0.25 MCG capsule Take 0.25 mcg by mouth daily.     . clopidogrel (PLAVIX) 75 MG tablet Take 75 mg by mouth daily.     . colchicine 0.6 MG tablet TAKE 0.5 TABLETS (0.3 TOTAL) BY MOUTH DAILY    . doxazosin (CARDURA) 8 MG tablet Take 1 tablet (8 mg total) by mouth daily. 30 tablet 11  . furosemide (LASIX) 80 MG tablet Take 80 mg by mouth daily.    . hydrALAZINE (  APRESOLINE) 50 MG tablet Take 1 tablet (50 mg total) by mouth 3 (three) times daily. 90 tablet 11  . levETIRAcetam (KEPPRA) 500 MG tablet Take 500 mg by mouth 2 (two) times daily.    . potassium chloride (K-DUR) 10 MEQ tablet Take 2 tablets (20 mEq total) by mouth 2 (two) times daily. 90 tablet 3   No current facility-administered medications for this visit.     Allergies:   Haldol [haloperidol decanoate]; Lorazepam; and Cephalexin    Social History:  The patient  reports that she has quit smoking. She has never used smokeless tobacco. She reports that she does not drink alcohol or use drugs.   Family History:  The patient's family history includes Cancer in her  father and sister; Hypertension in her mother; Other in her brother; Stroke in her mother.    ROS:  Please see the history of present illness.   Otherwise, review of systems are positive for none.   All other systems are reviewed and negative.    PHYSICAL EXAM: VS:  BP (!) 148/70 (BP Location: Left Arm, Patient Position: Sitting, Cuff Size: Normal)   Pulse 74   Ht 5' (1.524 m)   Wt 154 lb (69.9 kg)   BMI 30.08 kg/m  , BMI Body mass index is 30.08 kg/m. GEN: Well nourished, well developed, in no acute distress  HEENT: normal  Neck: no JVD, carotid bruits, or masses Cardiac: RRR; no rubs, or gallops,no edema . 2/ 6 systolic ejection murmur in the aortic area Respiratory:  clear to auscultation bilaterally, normal work of breathing GI: soft, nontender, nondistended, + BS. No abdominal bruit MS: no deformity or atrophy  Skin: warm and dry, no rash Neuro:  Strength and sensation are intact Psych: euthymic mood, full affect   EKG:  EKG is not ordered today.    Recent Labs: 10/03/2016: TSH 1.83 10/06/2016: B Natriuretic Peptide 39.3 10/08/2016: Magnesium 1.4 10/13/2016: ALT 17; Hemoglobin 10.5; Platelets 233 11/14/2016: BUN 31; Creat 3.06; Potassium 3.0; Sodium 139    Lipid Panel    Component Value Date/Time   CHOL 228 (H) 09/11/2014 0850   TRIG 211.0 (H) 09/11/2014 0850   HDL 41.50 09/11/2014 0850   CHOLHDL 5 09/11/2014 0850   VLDL 42.2 (H) 09/11/2014 0850   LDLCALC  04/25/2010 0459    67        Total Cholesterol/HDL:CHD Risk Coronary Heart Disease Risk Table                     Men   Women  1/2 Average Risk   3.4   3.3  Average Risk       5.0   4.4  2 X Average Risk   9.6   7.1  3 X Average Risk  23.4   11.0        Use the calculated Patient Ratio above and the CHD Risk Table to determine the patient's CHD Risk.        ATP III CLASSIFICATION (LDL):  <100     mg/dL   Optimal  100-129  mg/dL   Near or Above                    Optimal  130-159  mg/dL    Borderline  160-189  mg/dL   High  >190     mg/dL   Very High   LDLDIRECT 123.2 09/11/2014 0850      Wt Readings from Last 3 Encounters:  12/26/16 154 lb (69.9 kg)  11/14/16 154 lb (69.9 kg)  10/08/16 160 lb 9.6 oz (72.8 kg)     No flowsheet data found.    ASSESSMENT AND PLAN:  1.  Possible left renal artery stenosis or occlusion: Right renal artery was patent with no significant disease. Even if her left renal artery was occluded, she should be able to function with 1 kidney. Ischemic nephropathy is a potential cause of her chronic kidney disease and in an ideal situation, renal artery angiography would be more accurate in providing a  definitive diagnosis of renal artery stenosis. However, given her age and underlying chronic kidney disease, I strongly feel that the risks of angiography outweigh the benefits. Continue medical therapy.  2. Essential hypertension: Blood pressure is reasonably controlled.   Disposition:   FU with me prn  Signed,  Kathlyn Sacramento, MD  12/26/2016 1:14 PM    [facility]

## 2017-01-02 DIAGNOSIS — N184 Chronic kidney disease, stage 4 (severe): Secondary | ICD-10-CM | POA: Diagnosis not present

## 2017-01-02 IMAGING — CT CT ABD-PELV W/O CM
2 of 4 series · 17 of 46 positions shown, 19 images · non-contrast
Comparison: None.

CLINICAL DATA: Nausea and decreased appetite

EXAM:
CT ABDOMEN AND PELVIS WITHOUT CONTRAST
TECHNIQUE: Multidetector CT imaging of the abdomen and pelvis was performed
following the standard protocol without IV contrast.

[Series 2: abd/pel w/o · axial · non-contrast · 0.77mm/px · z∈[+818,+1193]mm · 14 of 83 slices shown, 16 images]
[im 4/83  soft-tissue]
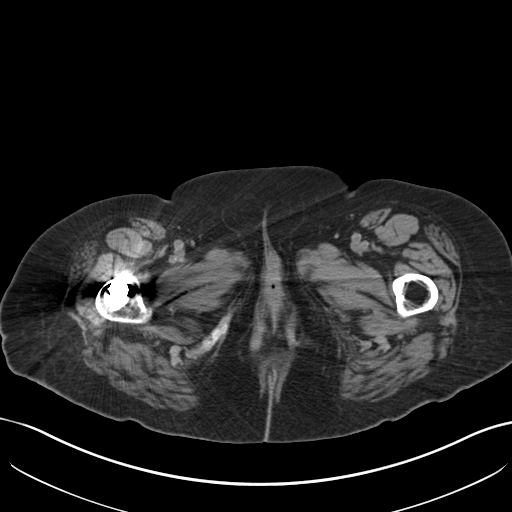
[im 4/83  bone]
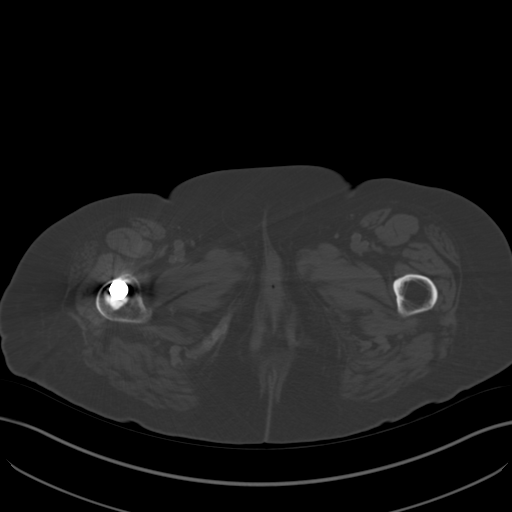
[im 11/83  soft-tissue]
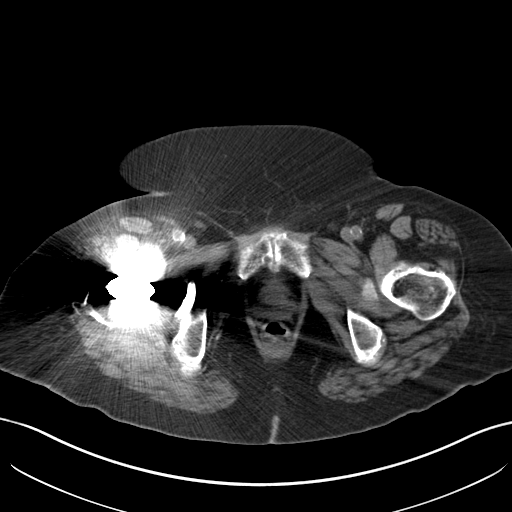
[im 18/83  soft-tissue]
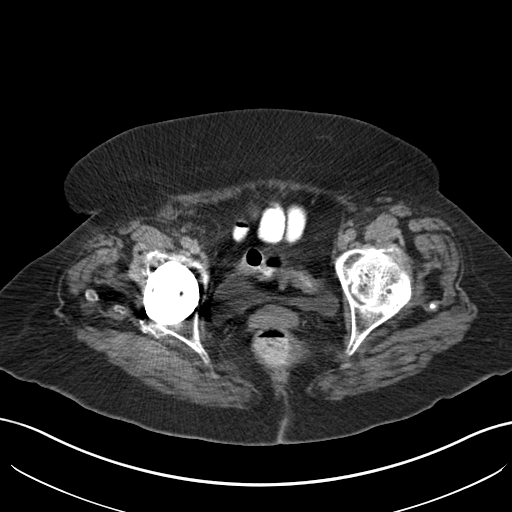
[im 21/83  soft-tissue]
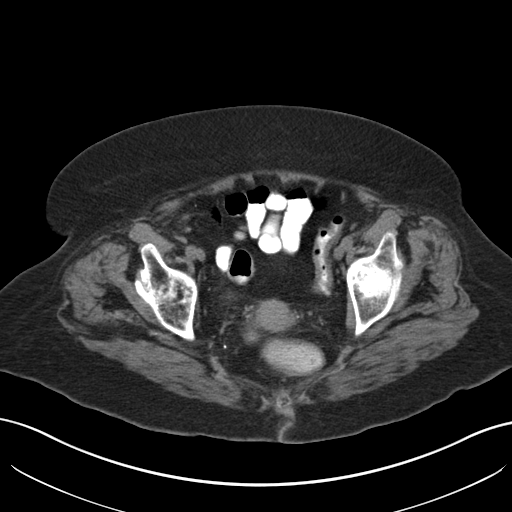
[im 28/83  soft-tissue]
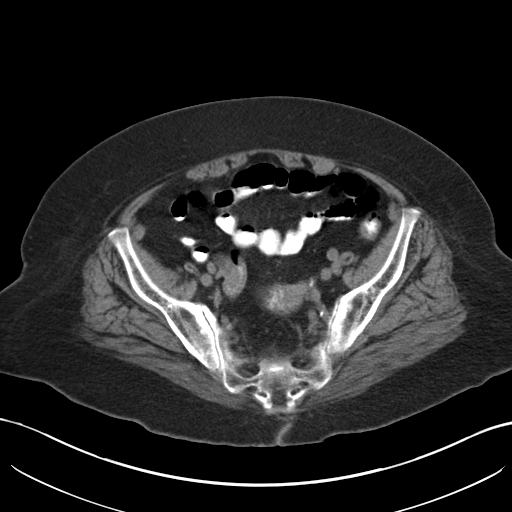
[im 35/83  soft-tissue]
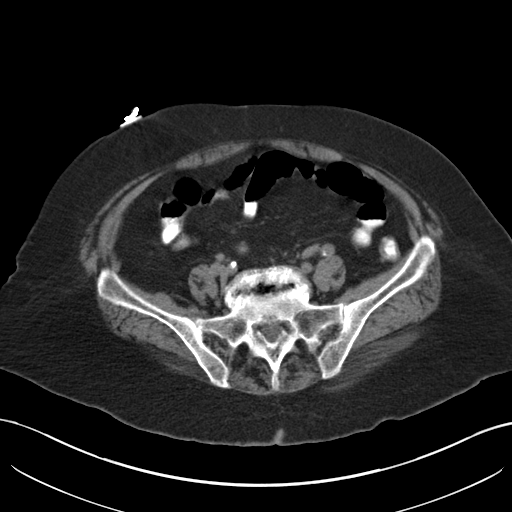
[im 38/83  soft-tissue]
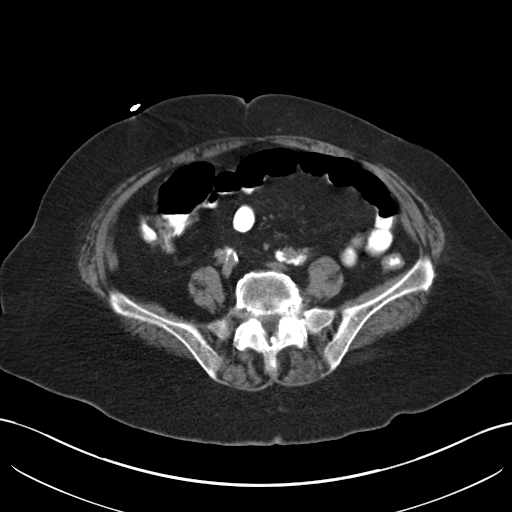
[im 45/83  soft-tissue]
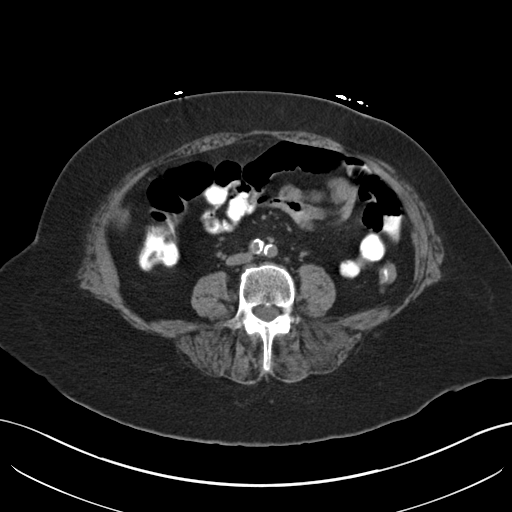
[im 48/83  soft-tissue]
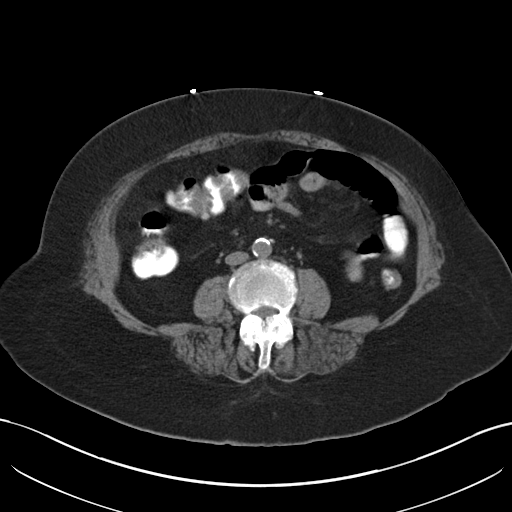
[im 48/83  bone]
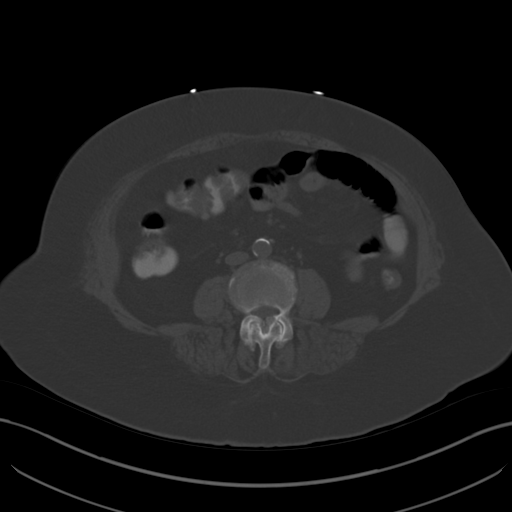
[im 55/83  soft-tissue]
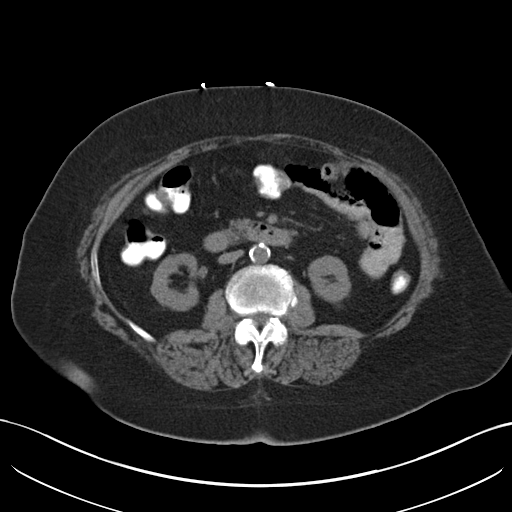
[im 62/83  soft-tissue]
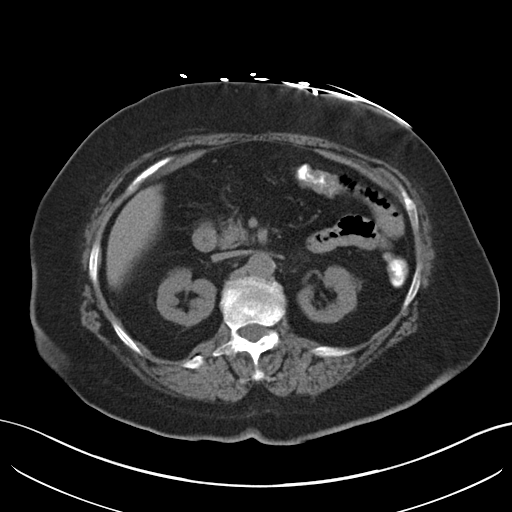
[im 65/83  soft-tissue]
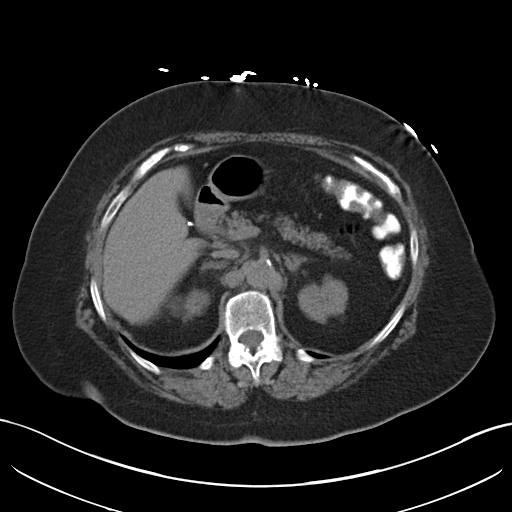
[im 72/83  soft-tissue]
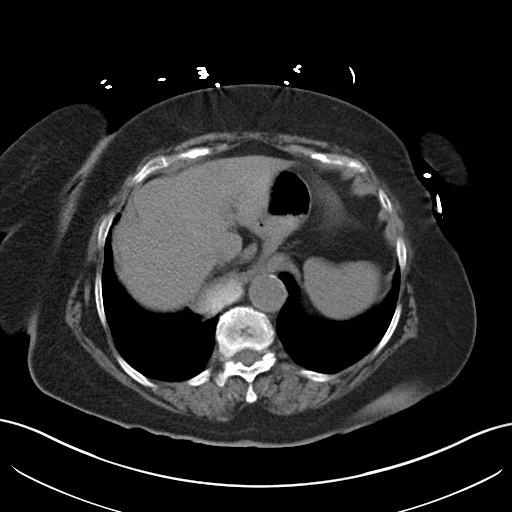
[im 79/83  soft-tissue]
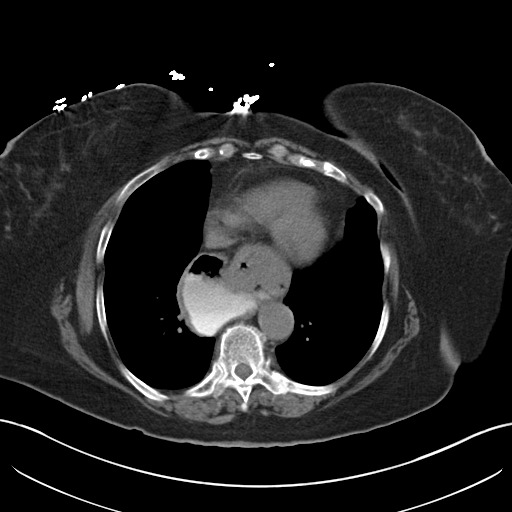

[Series 5: coronal · coronal · 0.81mm/px · 3 of 95 slices shown]
[im 32/95  soft-tissue]
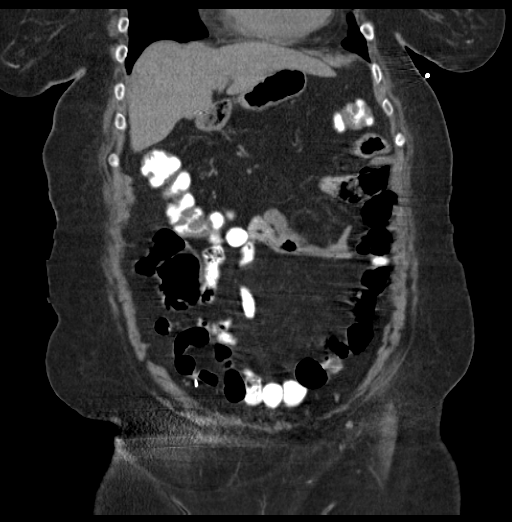
[im 42/95  soft-tissue]
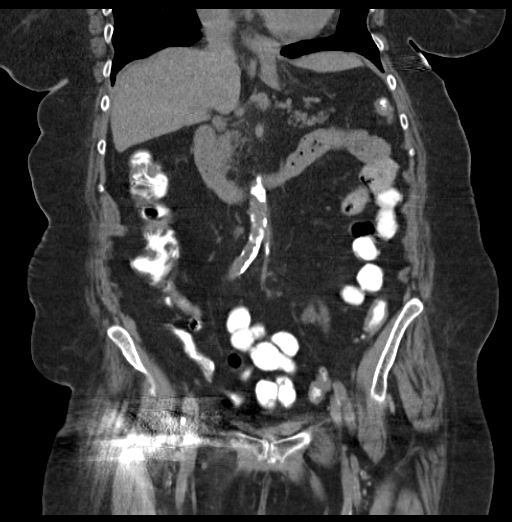
[im 53/95  soft-tissue]
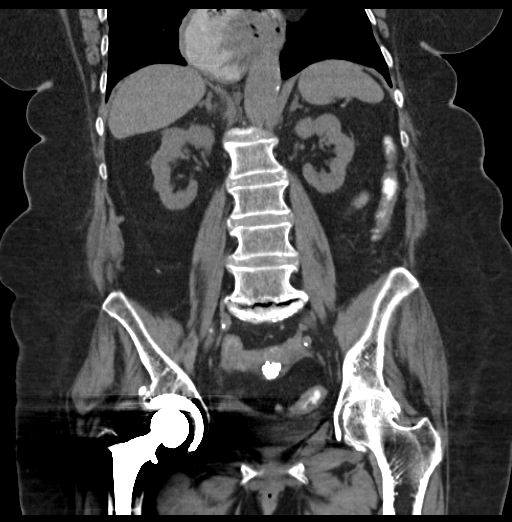

[17 of 46 positions shown; findings below may reference images not displayed]

FINDINGS: Lung bases are free of acute infiltrate or sizable effusion. A large
hiatal hernia is noted. The gallbladder has been surgically removed.

The liver, spleen, adrenal glands and pancreas are within normal
limits. The kidneys are well visualized and demonstrates some small
cysts. No renal calculi or obstructive changes are noted. The
bladder is well distended. Calcified uterine fibroids are seen. No
pelvic mass lesion is noted. No acute bony abnormality is seen. A
right hip replacement is noted. Degenerative change of the lumbar
spine is noted.
IMPRESSION: Chronic changes without acute abnormality.

## 2017-01-09 DIAGNOSIS — N189 Chronic kidney disease, unspecified: Secondary | ICD-10-CM | POA: Diagnosis not present

## 2017-01-09 DIAGNOSIS — I10 Essential (primary) hypertension: Secondary | ICD-10-CM | POA: Diagnosis not present

## 2017-01-09 DIAGNOSIS — G40909 Epilepsy, unspecified, not intractable, without status epilepticus: Secondary | ICD-10-CM | POA: Diagnosis not present

## 2017-02-05 DIAGNOSIS — N261 Atrophy of kidney (terminal): Secondary | ICD-10-CM | POA: Diagnosis not present

## 2017-02-05 DIAGNOSIS — N184 Chronic kidney disease, stage 4 (severe): Secondary | ICD-10-CM | POA: Diagnosis not present

## 2017-02-05 DIAGNOSIS — N179 Acute kidney failure, unspecified: Secondary | ICD-10-CM | POA: Diagnosis not present

## 2017-02-09 ENCOUNTER — Encounter: Payer: Self-pay | Admitting: Cardiovascular Disease

## 2017-02-19 ENCOUNTER — Encounter: Payer: Self-pay | Admitting: Cardiovascular Disease

## 2017-02-19 ENCOUNTER — Ambulatory Visit (INDEPENDENT_AMBULATORY_CARE_PROVIDER_SITE_OTHER): Payer: Medicare Other | Admitting: Cardiovascular Disease

## 2017-02-19 VITALS — BP 150/70 | HR 76 | Ht 60.0 in | Wt 154.8 lb

## 2017-02-19 DIAGNOSIS — I701 Atherosclerosis of renal artery: Secondary | ICD-10-CM

## 2017-02-19 DIAGNOSIS — I2510 Atherosclerotic heart disease of native coronary artery without angina pectoris: Secondary | ICD-10-CM

## 2017-02-19 DIAGNOSIS — I10 Essential (primary) hypertension: Secondary | ICD-10-CM | POA: Diagnosis not present

## 2017-02-19 DIAGNOSIS — R0989 Other specified symptoms and signs involving the circulatory and respiratory systems: Secondary | ICD-10-CM

## 2017-02-19 NOTE — Progress Notes (Signed)
Cardiology Office Note   Date:  02/19/2017   ID:  [name], DOB January 30, 1930, MRN [id number]  PCP:  [name], MD  Cardiologist:   Mertie Moores, MD   Chief Complaint  Patient presents with  . Hypertension   1. LBBB 2. Diastolic dysfunction 3. Anemia - received 2 units of PRBC  4. HTN 5, PVD - renal artery stenosis 6. CKD    Previous notes  [name] is seen back today for a 10 day check. She has a chronic LBBB, remote stress testing in 2005 which was normal, HTN, PVD with past renal artery stenosis, PAF (but with no real documentation of this apparently), iron deficiency anemia, seizure disorder and prior subdural hematoma and a past GI bleed. She has had chronic chest pain and palpitations for many years. Echo back in 2010 showed normal LV function, mild LVH with mild hypertrophy and moderate obstruction of the outflow track.   I saw her earlier this month. BP was quite high. We have adjusted her medicines and updated her echo. She has severe LVH and is felt to have diastolic dysfunction. Hydralazine was increased. She had not taken any of her medicines prior to her last visit with me.   She comes back today. She is here alone. Doing ok. BP still high here today. She brought her cuff in to check. Her readings in the memory are reviewed and for the most part look good. She feels ok and actually says she feels better. Riding her bike at home.   June 23, 2013:  [name] was last seen by me in 2011. She was seen by [name] in March 2014. She is breathing OK. She continues to have problems with anemia and is breathing better since she had her transfusion. No CP . She has white coat HTN. She does not add salt.   She was accompanied by family.   March 16, 2014:  [name] presents for follow up . BP was elevated here but it was high here. BP readings are ok at home.   Oct. 6. 2015:  April 07, 2015: BP is a bit high. Has her son and his daughter living with her  ( getting his house renovated )  Takes her meds regularly,  Sept. 14, 2016:  BP is very elevated this am  Is under stress - has a nephew who is not expected to live much longer (lung cancer)   Oct. 28, 2016:  Doing well.  We added Aldactone at her last visit.  She is feeling much better.  Has some fatigue with walking up stairs.  BP looks great . Avoiding salt .  April 06, 2016:  [name] is seen back for a visit. Was hospitalized with acute renal failure and hyperkalemia. Aldactone and ARB have been held .  Feeling better.  BP is well controlled.   Oct. 24. 2017  BP is up this am  Taking her meds. , has been well.  She is concerned about her HR going up and down . No syncope  February 19, 2017:  [name] is seen today for follow-up of her hypertension BP is ok No CP , no dyspnea.    Past Medical History:  Diagnosis Date  . Anxiety   . AVM (arteriovenous malformation) of colon   . CAD (coronary artery disease)   . Cervical polyp   . Chronic kidney disease   . Diverticulosis   . Esophageal stricture   . GERD (gastroesophageal reflux disease)   . Hiatal  hernia   . Hypertension   . Iron deficiency anemia   . Osteoarthritis   . Seizures ([place])   . Tubular adenoma of colon 2014  . Ventricular hypertrophy     Past Surgical History:  Procedure Laterality Date  . ANGIOPLASTY  1999  . Bilateral Total Knee Replacements     Left Knee-12/29/2003, Right Knee-04/10/2002  . BREAST SURGERY    . CARDIAC CATHETERIZATION    . CARPAL TUNNEL RELEASE    . CATARACT EXTRACTION    . COLONOSCOPY    . COLONOSCOPY W/ BIOPSIES    . CORONARY ANGIOPLASTY    . INGUINAL HERNIA REPAIR  08/08/2006   RIGHT INGUINAL HERNIA REPAIR WITH MESH  . JOINT REPLACEMENT    . KNEE ARTHROSCOPY    . LAPAROSCOPIC APPENDECTOMY    . LAPAROSCOPIC CHOLECYSTECTOMY    . MAJOR DUCT EXCISION OF LEFT BREAST  06/01/2004  . RIGHT SHOULDER ARTHROSCOPY  12/18/2006  . Right Total Hip Athroplasty  01/23/2002  .  TONSILLECTOMY       Current Outpatient Prescriptions  Medication Sig Dispense Refill  . amLODipine (NORVASC) 10 MG tablet Take 1 tablet (10 mg total) by mouth daily. 30 tablet 11  . calcitRIOL (ROCALTROL) 0.25 MCG capsule Take 0.25 mcg by mouth daily.     . clopidogrel (PLAVIX) 75 MG tablet Take 75 mg by mouth daily.     . colchicine 0.6 MG tablet TAKE 0.5 TABLETS (0.3 TOTAL) BY MOUTH DAILY    . doxazosin (CARDURA) 8 MG tablet Take 1 tablet (8 mg total) by mouth daily. 30 tablet 11  . furosemide (LASIX) 80 MG tablet Take 80 mg by mouth daily.    . hydrALAZINE (APRESOLINE) 50 MG tablet Take 1 tablet (50 mg total) by mouth 3 (three) times daily. 90 tablet 11  . levETIRAcetam (KEPPRA) 500 MG tablet Take 500 mg by mouth 2 (two) times daily.    . potassium chloride (K-DUR) 10 MEQ tablet Take 2 tablets (20 mEq total) by mouth 2 (two) times daily. 90 tablet 3   No current facility-administered medications for this visit.     Allergies:   Haldol [haloperidol decanoate]; Lorazepam; and Cephalexin    Social History:  The patient  reports that she has quit smoking. She has never used smokeless tobacco. She reports that she does not drink alcohol or use drugs.   Family History:  The patient's family history includes Cancer in her father and sister; Hypertension in her mother; Other in her brother; Stroke in her mother.    ROS:  Please see the history of present illness.    Review of Systems: Constitutional:  denies fever, chills, diaphoresis, appetite change and fatigue.  HEENT: denies photophobia, eye pain, redness, hearing loss, ear pain, congestion, sore throat, rhinorrhea, sneezing, neck pain, neck stiffness and tinnitus.  Respiratory: denies SOB, DOE, cough, chest tightness, and wheezing.  Cardiovascular: denies chest pain, palpitations and leg swelling.  Gastrointestinal: denies nausea, vomiting, abdominal pain, diarrhea, constipation, blood in stool.  Genitourinary: denies dysuria,  urgency, frequency, hematuria, flank pain and difficulty urinating.  Musculoskeletal: denies  myalgias, back pain, joint swelling, arthralgias and gait problem.   Skin: denies pallor, rash and wound.  Neurological: denies dizziness, seizures, syncope, weakness, light-headedness, numbness and headaches.   Hematological: denies adenopathy, easy bruising, personal or family bleeding history.  Psychiatric/ Behavioral: denies suicidal ideation, mood changes, confusion, nervousness, sleep disturbance and agitation.       All other systems are reviewed and negative.  PHYSICAL EXAM: VS:  BP (!) 150/70 (BP Location: Left Arm, Patient Position: Sitting, Cuff Size: Normal)   Pulse 76   Ht 5' (1.524 m)   Wt 154 lb 12.8 oz (70.2 kg)   SpO2 98%   BMI 30.23 kg/m  , BMI Body mass index is 30.23 kg/m. GEN: Well nourished, well developed, in no acute distress  HEENT: normal  Neck: no JVD, moderate L carotid bruit / L subclavian bruit , or masses Cardiac: RRR; no murmurs, rubs, or gallops,no edema  Respiratory:  clear to auscultation bilaterally, normal work of breathing GI: soft, nontender, nondistended, + BS MS: no deformity or atrophy  Skin: warm and dry, no rash Neuro:  Strength and sensation are intact Psych: normal   EKG:  EKG is ordered today. The ekg ordered today demonstrates :  Sinus brady  at 51.   Recent Labs: 10/03/2016: TSH 1.83 10/06/2016: B Natriuretic Peptide 39.3 10/08/2016: Magnesium 1.4 10/13/2016: ALT 17; Hemoglobin 10.5; Platelets 233 11/14/2016: BUN 31; Creat 3.06; Potassium 3.0; Sodium 139    Lipid Panel    Component Value Date/Time   CHOL 228 (H) 09/11/2014 0850   TRIG 211.0 (H) 09/11/2014 0850   HDL 41.50 09/11/2014 0850   CHOLHDL 5 09/11/2014 0850   VLDL 42.2 (H) 09/11/2014 0850   LDLCALC  04/25/2010 0459    67        Total Cholesterol/HDL:CHD Risk Coronary Heart Disease Risk Table                     Men   Women  1/2 Average Risk   3.4   3.3   Average Risk       5.0   4.4  2 X Average Risk   9.6   7.1  3 X Average Risk  23.4   11.0        Use the calculated Patient Ratio above and the CHD Risk Table to determine the patient's CHD Risk.        ATP III CLASSIFICATION (LDL):  <100     mg/dL   Optimal  100-129  mg/dL   Near or Above                    Optimal  130-159  mg/dL   Borderline  160-189  mg/dL   High  >190     mg/dL   Very High   LDLDIRECT 123.2 09/11/2014 0850      Wt Readings from Last 3 Encounters:  02/19/17 154 lb 12.8 oz (70.2 kg)  12/26/16 154 lb (69.9 kg)  11/14/16 154 lb (69.9 kg)      Other studies Reviewed: Additional studies/ records that were reviewed today include: . Review of the above records demonstrates:    ASSESSMENT AND PLAN:  1.  Sinus bradycardia. Her heart rate is 51 today. She comments that her heart rate runs low at home. She has not had any episodes of syncope. I would like to see if we can allow her heart rate to increase by stopping the clonidine. We will taper the clonidine down over the next 4 days. We will start her on Cardura 8 mg at night.   1. LBBB - her left ventricle systolic function is normal. 2. Diastolic dysfunction -  Need to continue to work on her BP   3. Anemia -   4. HTN- her blood pressure remains normal. She's on high doses of various medications.   Seems to be  doing well     5, PVD - renal artery stenosis -  Has seen Dr. Florene Glen  6. LVH -  7. 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   Seeing Dr. Florene Glen   Current medicines are reviewed at length with the patient today.  The patient does not have concerns regarding medicines.  The following changes have been made:  no change  Labs/ tests ordered today include:   No orders of the defined types were placed in this encounter.   Disposition:   FU with me in 6 months       Mertie Moores, MD  02/19/2017 10:25 AM    [address], [place], [place]  [address] Phone: [phone]; Fax:  [phone]

## 2017-02-19 NOTE — Patient Instructions (Signed)
Your physician recommends that you continue on your current medications as directed. Please refer to the Current Medication list given to you today.   Your physician has requested that you have a carotid duplex. This test is an ultrasound of the carotid arteries in your neck. It looks at blood flow through these arteries that supply the brain with blood. Allow one hour for this exam. There are no restrictions or special instructions.    Your physician wants you to follow-up in: [place] will receive a reminder letter in the mail two months in advance. If you don't receive a letter, please call our office to schedule the follow-up appointment.

## 2017-02-28 ENCOUNTER — Ambulatory Visit (HOSPITAL_COMMUNITY)
Admission: RE | Admit: 2017-02-28 | Discharge: 2017-02-28 | Disposition: A | Discharge status: Home / Self Care | Payer: Medicare Other | Source: Ambulatory Visit | Attending: Cardiovascular Disease | Admitting: Cardiovascular Disease

## 2017-02-28 DIAGNOSIS — I2510 Atherosclerotic heart disease of native coronary artery without angina pectoris: Secondary | ICD-10-CM | POA: Insufficient documentation

## 2017-02-28 DIAGNOSIS — I10 Essential (primary) hypertension: Secondary | ICD-10-CM | POA: Insufficient documentation

## 2017-02-28 DIAGNOSIS — R0989 Other specified symptoms and signs involving the circulatory and respiratory systems: Secondary | ICD-10-CM | POA: Diagnosis not present

## 2017-02-28 DIAGNOSIS — I6523 Occlusion and stenosis of bilateral carotid arteries: Secondary | ICD-10-CM | POA: Diagnosis not present

## 2017-04-10 DIAGNOSIS — I10 Essential (primary) hypertension: Secondary | ICD-10-CM | POA: Diagnosis not present

## 2017-04-10 DIAGNOSIS — G40909 Epilepsy, unspecified, not intractable, without status epilepticus: Secondary | ICD-10-CM | POA: Diagnosis not present

## 2017-04-10 DIAGNOSIS — M1A9XX0 Chronic gout, unspecified, without tophus (tophi): Secondary | ICD-10-CM | POA: Diagnosis not present

## 2017-04-10 DIAGNOSIS — N189 Chronic kidney disease, unspecified: Secondary | ICD-10-CM | POA: Diagnosis not present

## 2017-05-01 DIAGNOSIS — I701 Atherosclerosis of renal artery: Secondary | ICD-10-CM | POA: Diagnosis not present

## 2017-05-01 DIAGNOSIS — N184 Chronic kidney disease, stage 4 (severe): Secondary | ICD-10-CM | POA: Diagnosis not present

## 2017-05-01 DIAGNOSIS — N179 Acute kidney failure, unspecified: Secondary | ICD-10-CM | POA: Diagnosis not present

## 2017-05-01 DIAGNOSIS — N261 Atrophy of kidney (terminal): Secondary | ICD-10-CM | POA: Diagnosis not present

## 2017-05-31 DIAGNOSIS — N183 Chronic kidney disease, stage 3 (moderate): Secondary | ICD-10-CM | POA: Diagnosis not present

## 2017-06-29 ENCOUNTER — Other Ambulatory Visit: Payer: Self-pay

## 2017-06-29 DIAGNOSIS — Z961 Presence of intraocular lens: Secondary | ICD-10-CM | POA: Diagnosis not present

## 2017-06-29 DIAGNOSIS — H04123 Dry eye syndrome of bilateral lacrimal glands: Secondary | ICD-10-CM | POA: Diagnosis not present

## 2017-07-10 DIAGNOSIS — N186 End stage renal disease: Secondary | ICD-10-CM | POA: Diagnosis not present

## 2017-07-10 DIAGNOSIS — I10 Essential (primary) hypertension: Secondary | ICD-10-CM | POA: Diagnosis not present

## 2017-07-10 DIAGNOSIS — J301 Allergic rhinitis due to pollen: Secondary | ICD-10-CM | POA: Diagnosis not present

## 2017-07-10 DIAGNOSIS — H9193 Unspecified hearing loss, bilateral: Secondary | ICD-10-CM | POA: Diagnosis not present

## 2017-07-26 DIAGNOSIS — N185 Chronic kidney disease, stage 5: Secondary | ICD-10-CM | POA: Diagnosis not present

## 2017-07-26 DIAGNOSIS — N261 Atrophy of kidney (terminal): Secondary | ICD-10-CM | POA: Diagnosis not present

## 2017-07-26 DIAGNOSIS — I701 Atherosclerosis of renal artery: Secondary | ICD-10-CM | POA: Diagnosis not present

## 2017-07-26 DIAGNOSIS — N184 Chronic kidney disease, stage 4 (severe): Secondary | ICD-10-CM | POA: Diagnosis not present

## 2017-08-04 ENCOUNTER — Other Ambulatory Visit: Payer: Self-pay | Admitting: Physician Assistant

## 2017-09-11 DIAGNOSIS — I10 Essential (primary) hypertension: Secondary | ICD-10-CM | POA: Diagnosis not present

## 2017-09-11 DIAGNOSIS — Z23 Encounter for immunization: Secondary | ICD-10-CM | POA: Diagnosis not present

## 2017-09-16 ENCOUNTER — Other Ambulatory Visit: Payer: Self-pay | Admitting: Cardiovascular Disease

## 2017-10-10 DIAGNOSIS — I10 Essential (primary) hypertension: Secondary | ICD-10-CM | POA: Diagnosis not present

## 2017-10-10 DIAGNOSIS — M130 Polyarthritis, unspecified: Secondary | ICD-10-CM | POA: Diagnosis not present

## 2017-10-10 DIAGNOSIS — N189 Chronic kidney disease, unspecified: Secondary | ICD-10-CM | POA: Diagnosis not present

## 2017-10-15 DIAGNOSIS — N185 Chronic kidney disease, stage 5: Secondary | ICD-10-CM | POA: Diagnosis not present

## 2017-10-15 DIAGNOSIS — I701 Atherosclerosis of renal artery: Secondary | ICD-10-CM | POA: Diagnosis not present

## 2017-10-15 DIAGNOSIS — N261 Atrophy of kidney (terminal): Secondary | ICD-10-CM | POA: Diagnosis not present

## 2017-10-19 ENCOUNTER — Encounter: Payer: Self-pay | Admitting: Cardiovascular Disease

## 2017-10-19 ENCOUNTER — Other Ambulatory Visit: Payer: Self-pay | Admitting: Cardiovascular Disease

## 2017-10-19 ENCOUNTER — Ambulatory Visit (INDEPENDENT_AMBULATORY_CARE_PROVIDER_SITE_OTHER): Payer: Medicare Other | Admitting: Cardiovascular Disease

## 2017-10-19 VITALS — BP 148/70 | HR 67 | Ht 60.0 in | Wt 149.8 lb

## 2017-10-19 DIAGNOSIS — I10 Essential (primary) hypertension: Secondary | ICD-10-CM

## 2017-10-19 DIAGNOSIS — I5032 Chronic diastolic (congestive) heart failure: Secondary | ICD-10-CM | POA: Insufficient documentation

## 2017-10-19 DIAGNOSIS — I701 Atherosclerosis of renal artery: Secondary | ICD-10-CM | POA: Diagnosis not present

## 2017-10-19 NOTE — Progress Notes (Signed)
Cardiology Office Note   Date:  10/19/2017   ID:  [name], DOB 05-Oct-1930, MRN [id number]  PCP:  Lucianne Lei, MD  Cardiologist:   Mertie Moores, MD   Chief Complaint  Patient presents with  . Hypertension  . PAD   1. LBBB 2. Diastolic dysfunction 3. Anemia -    4. HTN 5, PVD - renal artery stenosis 6. CKD    Previous notes  [name] is seen back today for a 10 day check. She has a chronic LBBB, remote stress testing in 2005 which was normal, HTN, PVD with past renal artery stenosis, PAF (but with no real documentation of this apparently), iron deficiency anemia, seizure disorder and prior subdural hematoma and a past GI bleed. She has had chronic chest pain and palpitations for many years. Echo back in 2010 showed normal LV function, mild LVH with mild hypertrophy and moderate obstruction of the outflow track.   I saw her earlier this month. BP was quite high. We have adjusted her medicines and updated her echo. She has severe LVH and is felt to have diastolic dysfunction. Hydralazine was increased. She had not taken any of her medicines prior to her last visit with me.   She comes back today. She is here alone. Doing ok. BP still high here today. She brought her cuff in to check. Her readings in the memory are reviewed and for the most part look good. She feels ok and actually says she feels better. Riding her bike at home.   June 23, 2013:  [name] was last seen by me in 2011. She was seen by [name] in March 2014. She is breathing OK. She continues to have problems with anemia and is breathing better since she had her transfusion. No CP . She has white coat HTN. She does not add salt.   She was accompanied by family.   March 16, 2014:  [name] presents for follow up . BP was elevated here but it was high here. BP readings are ok at home.   Oct. 6. 2015:  April 07, 2015: BP is a bit high. Has her son and his daughter living with her ( getting his  house renovated )  Takes her meds regularly,  Sept. 14, 2016:  BP is very elevated this am  Is under stress - has a nephew who is not expected to live much longer (lung cancer)   Oct. 28, 2016:  Doing well.  We added Aldactone at her last visit.  She is feeling much better.  Has some fatigue with walking up stairs.  BP looks great . Avoiding salt .  April 06, 2016:  [name] is seen back for a visit. Was hospitalized with acute renal failure and hyperkalemia. Aldactone and ARB have been held .  Feeling better.  BP is well controlled.   Oct. 24. 2017  BP is up this am  Taking her meds. , has been well.  She is concerned about her HR going up and down . No syncope  February 19, 2017:  [name] is seen today for follow-up of her hypertension BP is ok No CP , no dyspnea.    Past Medical History:  Diagnosis Date  . Anxiety   . AVM (arteriovenous malformation) of colon   . CAD (coronary artery disease)   . Cervical polyp   . Chronic kidney disease   . Diverticulosis   . Esophageal stricture   . GERD (gastroesophageal reflux disease)   . Hiatal  hernia   . Hypertension   . Iron deficiency anemia   . Osteoarthritis   . Seizures ([place])   . Tubular adenoma of colon 2014  . Ventricular hypertrophy     Past Surgical History:  Procedure Laterality Date  . ANGIOPLASTY  1999  . Bilateral Total Knee Replacements     Left Knee-12/29/2003, Right Knee-04/10/2002  . BREAST SURGERY    . CARDIAC CATHETERIZATION    . CARPAL TUNNEL RELEASE    . CATARACT EXTRACTION    . COLONOSCOPY    . COLONOSCOPY W/ BIOPSIES    . CORONARY ANGIOPLASTY    . INGUINAL HERNIA REPAIR  08/08/2006   RIGHT INGUINAL HERNIA REPAIR WITH MESH  . JOINT REPLACEMENT    . KNEE ARTHROSCOPY    . LAPAROSCOPIC APPENDECTOMY    . LAPAROSCOPIC CHOLECYSTECTOMY    . MAJOR DUCT EXCISION OF LEFT BREAST  06/01/2004  . RIGHT SHOULDER ARTHROSCOPY  12/18/2006  . Right Total Hip Athroplasty  01/23/2002  . TONSILLECTOMY        Current Outpatient Medications  Medication Sig Dispense Refill  . amLODipine (NORVASC) 10 MG tablet TAKE 1 TABLET(10 MG) BY MOUTH DAILY 30 tablet 4  . clopidogrel (PLAVIX) 75 MG tablet Take 75 mg by mouth daily.     Marland Kitchen doxazosin (CARDURA) 8 MG tablet TAKE 1 TABLET(8 MG) BY MOUTH DAILY 30 tablet 4  . furosemide (LASIX) 80 MG tablet Take 80 mg by mouth daily.    . hydrALAZINE (APRESOLINE) 50 MG tablet Take 1 tablet (50 mg total) by mouth 3 (three) times daily. 90 tablet 11  . levETIRAcetam (KEPPRA) 500 MG tablet Take 500 mg by mouth 2 (two) times daily.    . potassium chloride (K-DUR) 10 MEQ tablet TAKE 2 TABLETS(20 MEQ) BY MOUTH TWICE DAILY 120 tablet 6   No current facility-administered medications for this visit.     Allergies:   Haldol [haloperidol decanoate]; Lorazepam; and Cephalexin    Social History:  The patient  reports that she has quit smoking. she has never used smokeless tobacco. She reports that she does not drink alcohol or use drugs.   Family History:  The patient's family history includes Cancer in her father and sister; Hypertension in her mother; Other in her brother; Stroke in her mother.    ROS:  Please see the history of present illness.    Review of Systems: Constitutional:  denies fever, chills, diaphoresis, appetite change and fatigue.  HEENT: denies photophobia, eye pain, redness, hearing loss, ear pain, congestion, sore throat, rhinorrhea, sneezing, neck pain, neck stiffness and tinnitus.  Respiratory: denies SOB, DOE, cough, chest tightness, and wheezing.  Cardiovascular: denies chest pain, palpitations and leg swelling.  Gastrointestinal: denies nausea, vomiting, abdominal pain, diarrhea, constipation, blood in stool.  Genitourinary: denies dysuria, urgency, frequency, hematuria, flank pain and difficulty urinating.  Musculoskeletal: denies  myalgias, back pain, joint swelling, arthralgias and gait problem.   Skin: denies pallor, rash and wound.   Neurological: denies dizziness, seizures, syncope, weakness, light-headedness, numbness and headaches.   Hematological: denies adenopathy, easy bruising, personal or family bleeding history.  Psychiatric/ Behavioral: denies suicidal ideation, mood changes, confusion, nervousness, sleep disturbance and agitation.       All other systems are reviewed and negative.   Physical Exam: Blood pressure (!) 148/70, pulse 67, height 5' (1.524 m), weight 149 lb 12.8 oz (67.9 kg).  GEN:  Well nourished, well developed in no acute distress HEENT: Normal NECK: No JVD; bilateral carotid bruits LYMPHATICS: No  lymphadenopathy CARDIAC: Regular rate S1-S2.  She has a 2/6 systolic ejection murmur., no rubs, gallops RESPIRATORY:  Clear to auscultation without rales, wheezing or rhonchi  ABDOMEN: Soft, non-tender, non-distended MUSCULOSKELETAL:  No edema; No deformity  SKIN: Warm and dry NEUROLOGIC:  Alert and oriented x 3   EKG:   October 19, 2017:  Normal sinus rhythm at 67.  Left bundle branch block.  There are no changes from previous EKGs.   Recent Labs: 11/14/2016: BUN 31; Creat 3.06; Potassium 3.0; Sodium 139    Lipid Panel    Component Value Date/Time   CHOL 228 (H) 09/11/2014 0850   TRIG 211.0 (H) 09/11/2014 0850   HDL 41.50 09/11/2014 0850   CHOLHDL 5 09/11/2014 0850   VLDL 42.2 (H) 09/11/2014 0850   LDLCALC  04/25/2010 0459    67        Total Cholesterol/HDL:CHD Risk Coronary Heart Disease Risk Table                     Men   Women  1/2 Average Risk   3.4   3.3  Average Risk       5.0   4.4  2 X Average Risk   9.6   7.1  3 X Average Risk  23.4   11.0        Use the calculated Patient Ratio above and the CHD Risk Table to determine the patient's CHD Risk.        ATP III CLASSIFICATION (LDL):  <100     mg/dL   Optimal  100-129  mg/dL   Near or Above                    Optimal  130-159  mg/dL   Borderline  160-189  mg/dL   High  >190     mg/dL   Very High   LDLDIRECT  123.2 09/11/2014 0850      Wt Readings from Last 3 Encounters:  10/19/17 149 lb 12.8 oz (67.9 kg)  02/19/17 154 lb 12.8 oz (70.2 kg)  12/26/16 154 lb (69.9 kg)      Other studies Reviewed: Additional studies/ records that were reviewed today include: . Review of the above records demonstrates:    ASSESSMENT AND PLAN:  1.  Sinus bradycardia.  HR is better    1. LBBB - her left ventricle systolic function is normal.  2. Diastolic dysfunction - BP is still a bit elevated.   She eats soup regularly, bacon on occasion   3. Anemia -    Plans  Per her primary MD   4. HTN-   5, PVD - no renal artery stenosis -  Has seen Dr. Florene Glen  6. LVH -   7. CKD - stage 4 CKD.   Sees Dr. Florene Glen.  creatin  Current medicines are reviewed at length with the patient today.  The patient does not have concerns regarding medicines.  The following changes have been made:  no change  Labs/ tests ordered today include:   No orders of the defined types were placed in this encounter.   Disposition:   FU with me in 6 months       Mertie Moores, MD  10/19/2017 10:00 AM    [address], [place], [place]  [address] Phone: [phone]; Fax: [phone]

## 2017-10-19 NOTE — Patient Instructions (Signed)
Medication Instructions:  Your physician recommends that you continue on your current medications as directed. Please refer to the Current Medication list given to you today.   Labwork: None Ordered   Testing/Procedures: None Ordered   Follow-Up: Your physician wants you to follow-up in: 6 months with Dr. Nahser.  You will receive a reminder letter in the mail two months in advance. If you don't receive a letter, please call our office to schedule the follow-up appointment.   If you need a refill on your cardiac medications before your next appointment, please call your pharmacy.   Thank you for choosing [facility]! Kanyon Bunn, RN [phone]    

## 2017-10-19 NOTE — Telephone Encounter (Signed)
Approved    Disp Refills Start End  amLODipine (NORVASC) 10 MG tablet 30 tablet 4 09/17/2017   Sig:  TAKE 1 TABLET(10 MG) BY MOUTH DAILY  Class:  Normal  DAW:  No  Authorizing Provider:  Thayer Headings, MD  Ordering User:  Juventino Slovak, CMA  doxazosin (CARDURA) 8 MG tablet 30 tablet 4 09/17/2017   Sig:  TAKE 1 TABLET(8 MG) BY MOUTH DAILY  Class:  Normal  DAW:  No  Authorizing Provider:  Thayer Headings, MD  Ordering User:  Juventino Slovak, CMA  Visit Pharmacy   [id number] - [place], [address] AT [place]  Contacts    Type Contact Phone  09/16/2017 07:12 AM Interface (Incoming) [facility] [phone]

## 2017-10-23 DIAGNOSIS — N185 Chronic kidney disease, stage 5: Secondary | ICD-10-CM | POA: Diagnosis not present

## 2017-10-31 DIAGNOSIS — I10 Essential (primary) hypertension: Secondary | ICD-10-CM | POA: Diagnosis not present

## 2017-10-31 DIAGNOSIS — N185 Chronic kidney disease, stage 5: Secondary | ICD-10-CM | POA: Diagnosis not present

## 2017-10-31 DIAGNOSIS — I5032 Chronic diastolic (congestive) heart failure: Secondary | ICD-10-CM | POA: Diagnosis not present

## 2017-11-01 ENCOUNTER — Other Ambulatory Visit: Payer: Self-pay | Admitting: Cardiovascular Disease

## 2017-11-01 IMAGING — CR DG CHEST 2V
2 series · 2 of 2 positions shown · non-contrast
Comparison: 12/13/2015

CLINICAL DATA: Nausea, fever, chills for 2 days

EXAM:
CHEST  2 VIEW

[chest pa]
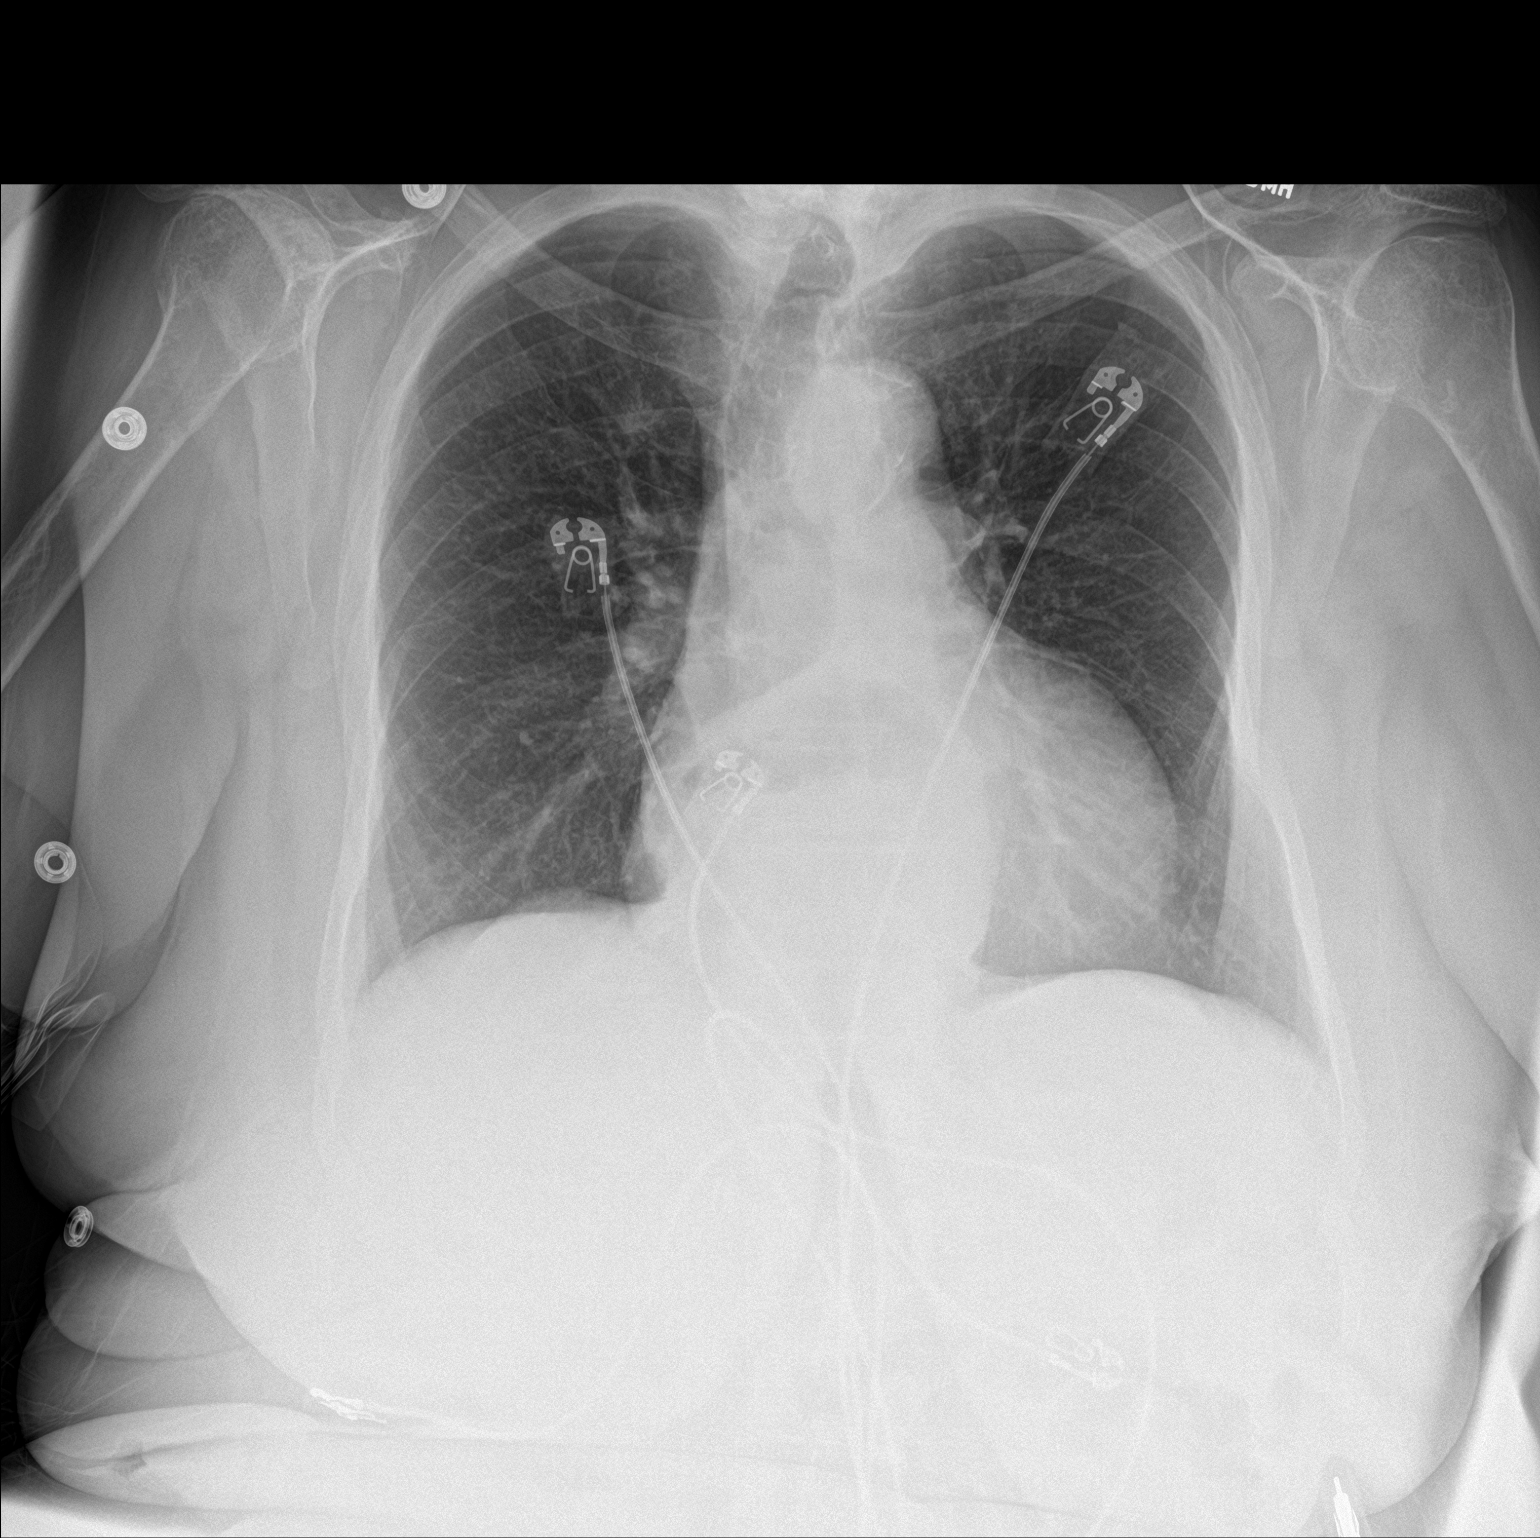

[chest lat]
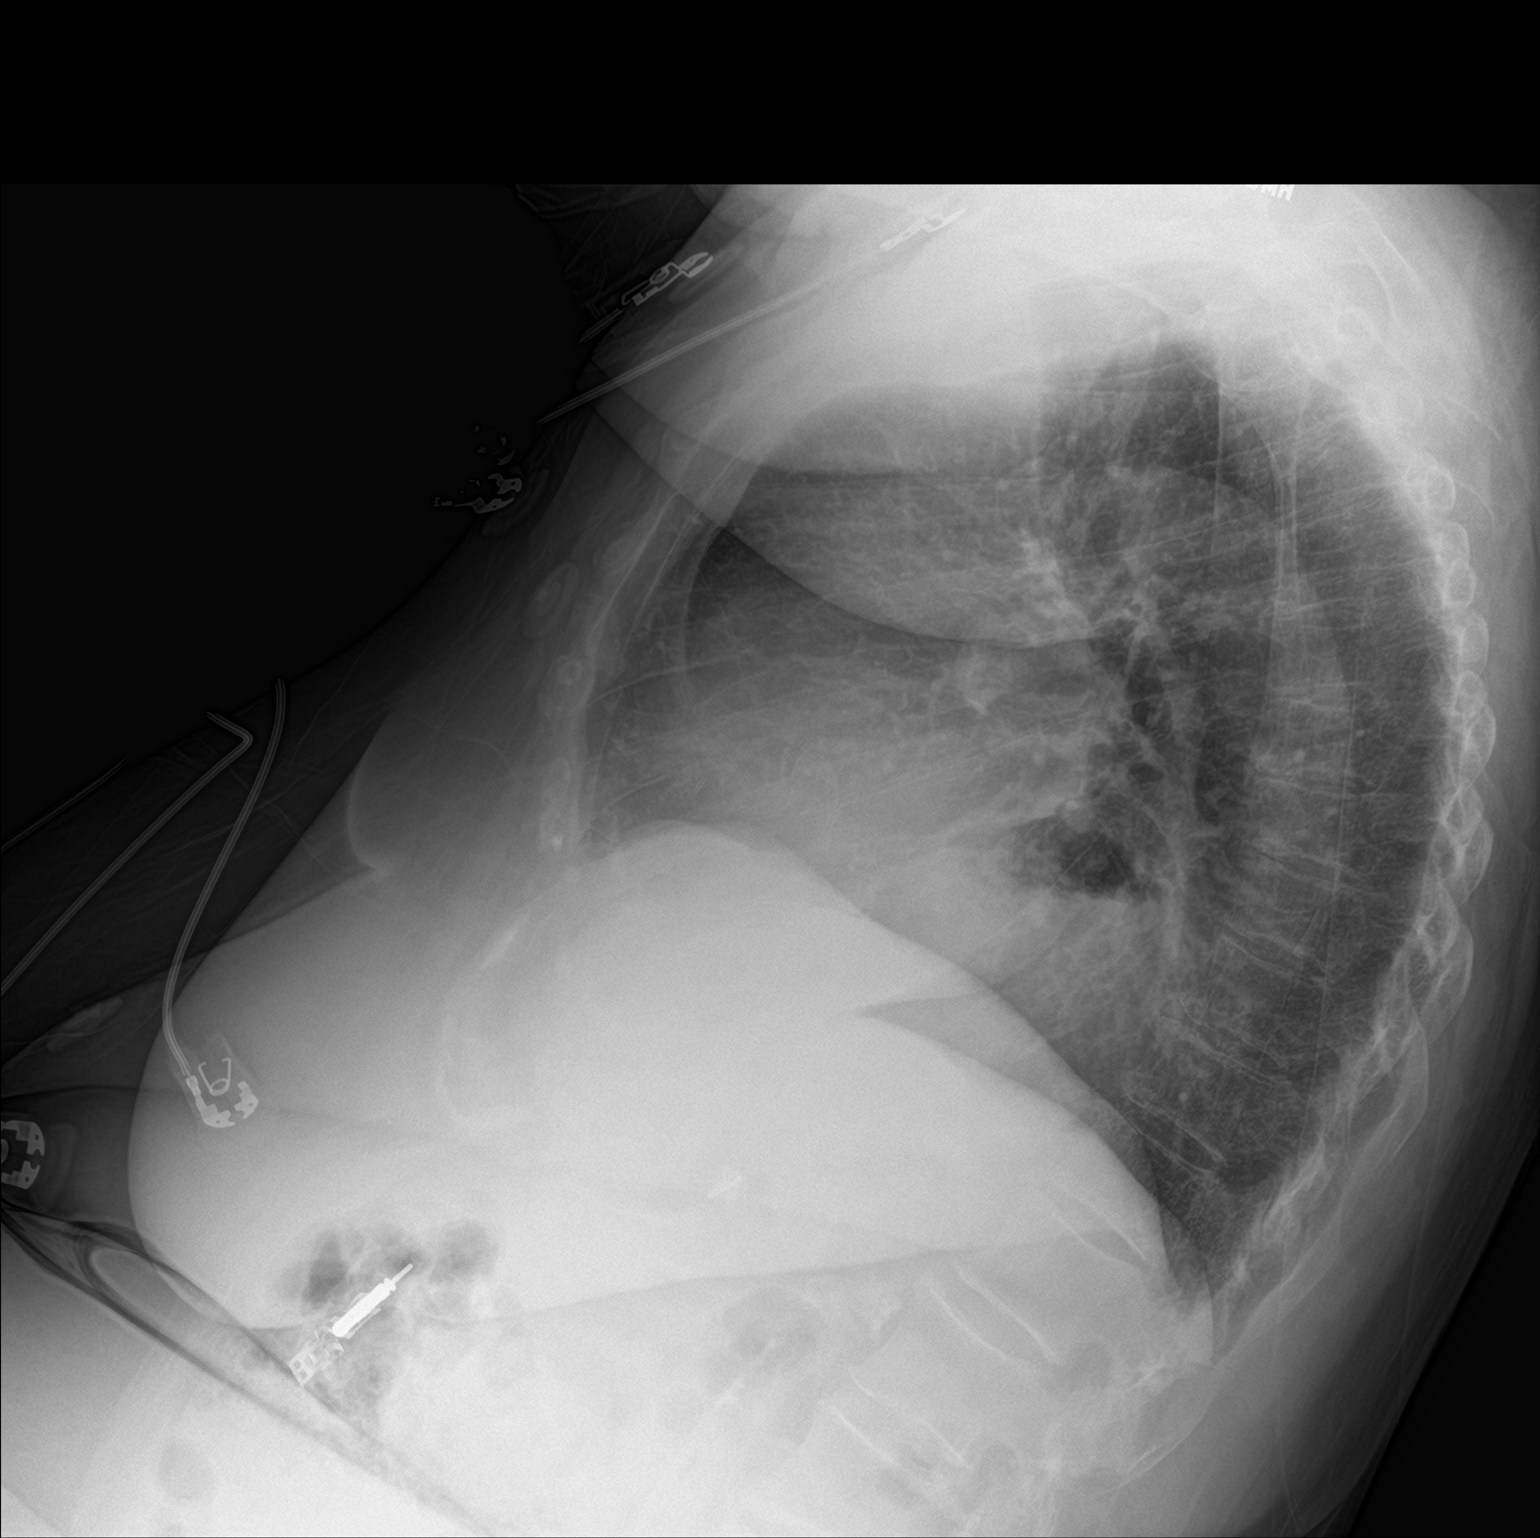

[2 of 2 positions shown; findings below may reference images not displayed]

FINDINGS: Large hiatal hernia. Mild cardiomegaly. Lungs are clear. No
effusions or acute bony abnormality.
IMPRESSION: Large hiatal hernia and mild cardiomegaly.  No active disease.

## 2017-11-05 ENCOUNTER — Other Ambulatory Visit: Payer: Self-pay

## 2017-11-05 ENCOUNTER — Encounter (HOSPITAL_COMMUNITY): Payer: Self-pay | Admitting: Emergency Medicine

## 2017-11-05 ENCOUNTER — Inpatient Hospital Stay (HOSPITAL_COMMUNITY)
Admission: EM | Admit: 2017-11-05 | Discharge: 2017-11-10 | DRG: 305 | Disposition: A | Discharge status: Home / Self Care | Payer: Medicare Other | Attending: Family Medicine | Admitting: Family Medicine

## 2017-11-05 DIAGNOSIS — N179 Acute kidney failure, unspecified: Secondary | ICD-10-CM | POA: Diagnosis present

## 2017-11-05 DIAGNOSIS — I10 Essential (primary) hypertension: Secondary | ICD-10-CM | POA: Diagnosis present

## 2017-11-05 DIAGNOSIS — N184 Chronic kidney disease, stage 4 (severe): Secondary | ICD-10-CM | POA: Diagnosis not present

## 2017-11-05 DIAGNOSIS — G40909 Epilepsy, unspecified, not intractable, without status epilepticus: Secondary | ICD-10-CM

## 2017-11-05 DIAGNOSIS — R030 Elevated blood-pressure reading, without diagnosis of hypertension: Secondary | ICD-10-CM | POA: Diagnosis not present

## 2017-11-05 DIAGNOSIS — Z888 Allergy status to other drugs, medicaments and biological substances status: Secondary | ICD-10-CM

## 2017-11-05 DIAGNOSIS — Z881 Allergy status to other antibiotic agents status: Secondary | ICD-10-CM

## 2017-11-05 DIAGNOSIS — I701 Atherosclerosis of renal artery: Secondary | ICD-10-CM | POA: Diagnosis not present

## 2017-11-05 DIAGNOSIS — M1990 Unspecified osteoarthritis, unspecified site: Secondary | ICD-10-CM | POA: Diagnosis present

## 2017-11-05 DIAGNOSIS — K219 Gastro-esophageal reflux disease without esophagitis: Secondary | ICD-10-CM | POA: Diagnosis present

## 2017-11-05 DIAGNOSIS — Z87891 Personal history of nicotine dependence: Secondary | ICD-10-CM

## 2017-11-05 DIAGNOSIS — Z8249 Family history of ischemic heart disease and other diseases of the circulatory system: Secondary | ICD-10-CM

## 2017-11-05 DIAGNOSIS — I5032 Chronic diastolic (congestive) heart failure: Secondary | ICD-10-CM | POA: Diagnosis present

## 2017-11-05 DIAGNOSIS — Z9861 Coronary angioplasty status: Secondary | ICD-10-CM

## 2017-11-05 DIAGNOSIS — D509 Iron deficiency anemia, unspecified: Secondary | ICD-10-CM | POA: Diagnosis present

## 2017-11-05 DIAGNOSIS — I2510 Atherosclerotic heart disease of native coronary artery without angina pectoris: Secondary | ICD-10-CM | POA: Diagnosis present

## 2017-11-05 DIAGNOSIS — Z23 Encounter for immunization: Secondary | ICD-10-CM | POA: Diagnosis not present

## 2017-11-05 DIAGNOSIS — Z9114 Patient's other noncompliance with medication regimen: Secondary | ICD-10-CM

## 2017-11-05 DIAGNOSIS — E876 Hypokalemia: Secondary | ICD-10-CM | POA: Diagnosis present

## 2017-11-05 DIAGNOSIS — I161 Hypertensive emergency: Secondary | ICD-10-CM | POA: Diagnosis not present

## 2017-11-05 DIAGNOSIS — Z96653 Presence of artificial knee joint, bilateral: Secondary | ICD-10-CM | POA: Diagnosis present

## 2017-11-05 DIAGNOSIS — F419 Anxiety disorder, unspecified: Secondary | ICD-10-CM | POA: Diagnosis present

## 2017-11-05 DIAGNOSIS — I130 Hypertensive heart and chronic kidney disease with heart failure and stage 1 through stage 4 chronic kidney disease, or unspecified chronic kidney disease: Secondary | ICD-10-CM | POA: Diagnosis present

## 2017-11-05 DIAGNOSIS — Z79899 Other long term (current) drug therapy: Secondary | ICD-10-CM

## 2017-11-05 DIAGNOSIS — I160 Hypertensive urgency: Secondary | ICD-10-CM | POA: Diagnosis present

## 2017-11-05 DIAGNOSIS — I447 Left bundle-branch block, unspecified: Secondary | ICD-10-CM | POA: Diagnosis present

## 2017-11-05 DIAGNOSIS — Z7902 Long term (current) use of antithrombotics/antiplatelets: Secondary | ICD-10-CM

## 2017-11-05 DIAGNOSIS — D631 Anemia in chronic kidney disease: Secondary | ICD-10-CM | POA: Diagnosis present

## 2017-11-05 LAB — COMPREHENSIVE METABOLIC PANEL
ALK PHOS: 60 U/L (ref 38–126)
ALT: 12 U/L — ABNORMAL LOW (ref 14–54)
ANION GAP: 9 (ref 5–15)
AST: 16 U/L (ref 15–41)
Albumin: 3.9 g/dL (ref 3.5–5.0)
BUN: 52 mg/dL — ABNORMAL HIGH (ref 6–20)
CALCIUM: 10.2 mg/dL (ref 8.9–10.3)
CO2: 17 mmol/L — AB (ref 22–32)
Chloride: 111 mmol/L (ref 101–111)
Creatinine, Ser: 4.07 mg/dL — ABNORMAL HIGH (ref 0.44–1.00)
GFR calc non Af Amer: 9 mL/min — ABNORMAL LOW (ref >60)
GFR, EST AFRICAN AMERICAN: 11 mL/min — AB (ref >60)
Glucose, Bld: 90 mg/dL (ref 65–99)
POTASSIUM: 4.2 mmol/L (ref 3.5–5.1)
SODIUM: 137 mmol/L (ref 135–145)
TOTAL PROTEIN: 6.7 g/dL (ref 6.5–8.1)
Total Bilirubin: 0.8 mg/dL (ref 0.3–1.2)

## 2017-11-05 LAB — CBC WITH DIFFERENTIAL/PLATELET
Basophils Absolute: 0 10*3/uL (ref 0.0–0.1)
Basophils Relative: 0 %
EOS ABS: 0.1 10*3/uL (ref 0.0–0.7)
EOS PCT: 1 %
HCT: 31.4 % — ABNORMAL LOW (ref 36.0–46.0)
HEMOGLOBIN: 10.4 g/dL — AB (ref 12.0–15.0)
LYMPHS ABS: 1.6 10*3/uL (ref 0.7–4.0)
Lymphocytes Relative: 27 %
MCH: 29.5 pg (ref 26.0–34.0)
MCHC: 33.1 g/dL (ref 30.0–36.0)
MCV: 89 fL (ref 78.0–100.0)
MONO ABS: 0.3 10*3/uL (ref 0.1–1.0)
MONOS PCT: 4 %
NEUTROS PCT: 68 %
Neutro Abs: 4.1 10*3/uL (ref 1.7–7.7)
Platelets: 235 10*3/uL (ref 150–400)
RBC: 3.53 MIL/uL — ABNORMAL LOW (ref 3.87–5.11)
RDW: 13.4 % (ref 11.5–15.5)
WBC: 6.1 10*3/uL (ref 4.0–10.5)

## 2017-11-05 LAB — TSH: TSH: 1.657 u[IU]/mL (ref 0.350–4.500)

## 2017-11-05 MED ORDER — ONDANSETRON HCL 4 MG PO TABS: 4.0000 mg | ORAL_TABLET | Freq: Four times a day (QID) | ORAL | Status: DC | PRN

## 2017-11-05 MED ORDER — FUROSEMIDE 20 MG PO TABS
80.0000 mg | ORAL_TABLET | Freq: Every day | ORAL | Status: DC
  Administered 2017-11-06 – 2017-11-07 (×2): 80 mg via ORAL
  Filled 2017-11-05 (×2): qty 4

## 2017-11-05 MED ORDER — HYDRALAZINE HCL 25 MG PO TABS: 50.0000 mg | ORAL_TABLET | Freq: Once | ORAL | Status: DC

## 2017-11-05 MED ORDER — AMLODIPINE BESYLATE 5 MG PO TABS
10.0000 mg | ORAL_TABLET | Freq: Every day | ORAL | Status: DC
  Administered 2017-11-06 – 2017-11-10 (×5): 10 mg via ORAL
  Filled 2017-11-05 (×5): qty 2

## 2017-11-05 MED ORDER — DOXAZOSIN MESYLATE 8 MG PO TABS
8.0000 mg | ORAL_TABLET | Freq: Every day | ORAL | Status: DC
  Administered 2017-11-06 – 2017-11-10 (×5): 8 mg via ORAL
  Filled 2017-11-05 (×5): qty 1

## 2017-11-05 MED ORDER — HYDRALAZINE HCL 25 MG PO TABS
50.0000 mg | ORAL_TABLET | Freq: Three times a day (TID) | ORAL | Status: DC
  Administered 2017-11-05 – 2017-11-07 (×5): 50 mg via ORAL
  Filled 2017-11-05 (×5): qty 2

## 2017-11-05 MED ORDER — POTASSIUM CHLORIDE CRYS ER 10 MEQ PO TBCR
10.0000 meq | EXTENDED_RELEASE_TABLET | Freq: Two times a day (BID) | ORAL | Status: DC
  Administered 2017-11-06 – 2017-11-09 (×7): 10 meq via ORAL
  Filled 2017-11-05 (×7): qty 1

## 2017-11-05 MED ORDER — HYDRALAZINE HCL 20 MG/ML IJ SOLN
10.0000 mg | Freq: Once | INTRAMUSCULAR | Status: AC
  Administered 2017-11-05: 10 mg via INTRAVENOUS
  Filled 2017-11-05: qty 1

## 2017-11-05 MED ORDER — ACETAMINOPHEN 325 MG PO TABS
650.0000 mg | ORAL_TABLET | Freq: Four times a day (QID) | ORAL | Status: DC | PRN
  Administered 2017-11-05: 650 mg via ORAL
  Filled 2017-11-05: qty 2

## 2017-11-05 MED ORDER — AMLODIPINE BESYLATE 5 MG PO TABS
10.0000 mg | ORAL_TABLET | Freq: Once | ORAL | Status: AC
  Administered 2017-11-05: 10 mg via ORAL
  Filled 2017-11-05: qty 2

## 2017-11-05 MED ORDER — METOPROLOL TARTRATE 5 MG/5ML IV SOLN: 5.0000 mg | Freq: Four times a day (QID) | INTRAVENOUS | Status: DC | PRN

## 2017-11-05 MED ORDER — METOPROLOL TARTRATE 5 MG/5ML IV SOLN
INTRAVENOUS | Status: AC
  Administered 2017-11-05: 5 mg
  Filled 2017-11-05: qty 5

## 2017-11-05 MED ORDER — ONDANSETRON HCL 4 MG/2ML IJ SOLN
4.0000 mg | Freq: Four times a day (QID) | INTRAMUSCULAR | Status: DC | PRN
Start: 2017-11-05 — End: 2017-11-10

## 2017-11-05 MED ORDER — BISACODYL 10 MG RE SUPP: 10.0000 mg | Freq: Every day | RECTAL | Status: DC | PRN

## 2017-11-05 MED ORDER — CLOPIDOGREL BISULFATE 75 MG PO TABS
75.0000 mg | ORAL_TABLET | Freq: Every day | ORAL | Status: DC
  Administered 2017-11-06 – 2017-11-10 (×5): 75 mg via ORAL
  Filled 2017-11-05 (×5): qty 1

## 2017-11-05 MED ORDER — HEPARIN SODIUM (PORCINE) 5000 UNIT/ML IJ SOLN
5000.0000 [IU] | Freq: Three times a day (TID) | INTRAMUSCULAR | Status: DC
  Administered 2017-11-05 – 2017-11-10 (×15): 5000 [IU] via SUBCUTANEOUS
  Filled 2017-11-05 (×15): qty 1

## 2017-11-05 MED ORDER — PNEUMOCOCCAL VAC POLYVALENT 25 MCG/0.5ML IJ INJ: 0.5000 mL | INJECTION | INTRAMUSCULAR | Status: DC

## 2017-11-05 MED ORDER — SENNOSIDES-DOCUSATE SODIUM 8.6-50 MG PO TABS: 1.0000 | ORAL_TABLET | Freq: Every evening | ORAL | Status: DC | PRN

## 2017-11-05 MED ORDER — PNEUMOCOCCAL 13-VAL CONJ VACC IM SUSP
0.5000 mL | INTRAMUSCULAR | Status: AC
  Administered 2017-11-07: 0.5 mL via INTRAMUSCULAR
  Filled 2017-11-05: qty 0.5

## 2017-11-05 MED ORDER — HYDRALAZINE HCL 20 MG/ML IJ SOLN: 5.0000 mg | Freq: Four times a day (QID) | INTRAMUSCULAR | Status: DC | PRN

## 2017-11-05 MED ORDER — HYDRALAZINE HCL 20 MG/ML IJ SOLN: 10.0000 mg | INTRAMUSCULAR | Status: DC | PRN

## 2017-11-05 MED ORDER — LEVETIRACETAM 500 MG PO TABS
500.0000 mg | ORAL_TABLET | Freq: Two times a day (BID) | ORAL | Status: DC
  Administered 2017-11-05 – 2017-11-10 (×10): 500 mg via ORAL
  Filled 2017-11-05 (×10): qty 1

## 2017-11-05 MED ORDER — SODIUM CHLORIDE 0.9 % IV SOLN
INTRAVENOUS | Status: DC
  Administered 2017-11-05: 18:00:00 via INTRAVENOUS

## 2017-11-05 MED ORDER — POTASSIUM CHLORIDE 10 MEQ/50ML IV SOLN: 10.0000 meq | INTRAVENOUS | Status: DC

## 2017-11-05 MED ORDER — ACETAMINOPHEN 650 MG RE SUPP: 650.0000 mg | Freq: Four times a day (QID) | RECTAL | Status: DC | PRN

## 2017-11-05 NOTE — ED Triage Notes (Signed)
Pt lives by herself and has a home health nurse that came by today and noticed her bp was up, pt told ems she has been out of her 2 meds amlodipine and doxazosin x 3 days has not been able  Pick them up , pt states she has had some dizziness

## 2017-11-05 NOTE — H&P (Signed)
History and Physical    Reena Borromeo VFI:[id number] DOB: 1930/12/11 DOA: 11/05/2017   PCP: Lucianne Lei, MD   Patient coming from:  Home    Chief Complaint: Elevated blood pressure  HPI: Cassondra Stachowski is a 83 y.o. female with medical history significant for CAD, hypertension, renal artery stenosis, GERD, CKD, iron deficiency anemia, history of seizures, brought by EMS, after her home nurse came and noticed that her blood pressure was very elevated in the 240s.  At the time of presentation, she did report that she has been out of amlodipine for the last 2 days, although she has been compliant with her other medications including hydralazine.  She denies any headaches, or vision changes, she denies any significant dizziness, vertigo, syncope or presyncope.  She denies any chest pain or palpitations.  She denies any headache or vision changes.  She denies any difficulty walking, focal numbness or weakness.  She denies any lower extremity edema.  She is compliant with her medications, and with her doctors visits.  Of note, the patient has been told by her nephrologist Dr. Florene Glen that her renal functions are worsened lately.  His any food indiscretion.  She denies any poor liquid intake   ED Course:  BP (!) 174/58   Pulse 63   Resp 19   SpO2 100%   Hydralazine 10 mg IV x1, and Norvasc 10 mg Blood pressure remained in the 416S-063K systolic. Creatinine is 4, baseline was at 3 until recently.  Review of Systems:  As per HPI otherwise all other systems reviewed and are negative  Past Medical History:  Diagnosis Date  . Anxiety   . AVM (arteriovenous malformation) of colon   . CAD (coronary artery disease)   . Cervical polyp   . Chronic kidney disease   . Diverticulosis   . Esophageal stricture   . GERD (gastroesophageal reflux disease)   . Hiatal hernia   . Hypertension   . Iron deficiency anemia   . Osteoarthritis   . Seizures ([place])   . Tubular adenoma of colon 2014  .  Ventricular hypertrophy     Past Surgical History:  Procedure Laterality Date  . ANGIOPLASTY  1999  . Bilateral Total Knee Replacements     Left Knee-12/29/2003, Right Knee-04/10/2002  . BREAST SURGERY    . CARDIAC CATHETERIZATION    . CARPAL TUNNEL RELEASE    . CATARACT EXTRACTION    . COLONOSCOPY    . COLONOSCOPY W/ BIOPSIES    . CORONARY ANGIOPLASTY    . INGUINAL HERNIA REPAIR  08/08/2006   RIGHT INGUINAL HERNIA REPAIR WITH MESH  . JOINT REPLACEMENT    . KNEE ARTHROSCOPY    . LAPAROSCOPIC APPENDECTOMY    . LAPAROSCOPIC CHOLECYSTECTOMY    . MAJOR DUCT EXCISION OF LEFT BREAST  06/01/2004  . RIGHT SHOULDER ARTHROSCOPY  12/18/2006  . Right Total Hip Athroplasty  01/23/2002  . TONSILLECTOMY      Social History Social History   Socioeconomic History  . Marital status: Widowed    Spouse name: Not on file  . Number of children: Not on file  . Years of education: Not on file  . Highest education level: Not on file  Social Needs  . Financial resource strain: Not on file  . Food insecurity - worry: Not on file  . Food insecurity - inability: Not on file  . Transportation needs - medical: Not on file  . Transportation needs - non-medical: Not on file  Occupational History  .  Not on file  Tobacco Use  . Smoking status: Former Research scientist (life sciences)  . Smokeless tobacco: Never Used  Substance and Sexual Activity  . Alcohol use: No  . Drug use: No  . Sexual activity: No  Other Topics Concern  . Not on file  Social History Narrative  . Not on file     Allergies  Allergen Reactions  . Haldol [Haloperidol Decanoate] Other (See Comments)    Agitation, aggressive and combative  . Lorazepam Other (See Comments)    Agitation, lethargic  . Cephalexin Other (See Comments)    REACTION: Questionable, no reaction listed    Family History  Problem Relation Age of Onset  . Hypertension Mother   . Stroke Mother   . Cancer Father   . Cancer Sister   . Other Brother        brain tumor       Prior to Admission medications   Medication Sig Start Date End Date Taking? Authorizing Provider  amLODipine (NORVASC) 10 MG tablet TAKE 1 TABLET(10 MG) BY MOUTH DAILY 11/05/17   Nahser, Wonda Cheng, MD  clopidogrel (PLAVIX) 75 MG tablet Take 75 mg by mouth daily.  08/21/14   [provider]  doxazosin (CARDURA) 8 MG tablet TAKE 1 TABLET(8 MG) BY MOUTH DAILY 11/05/17   Nahser, Wonda Cheng, MD  furosemide (LASIX) 80 MG tablet Take 80 mg by mouth daily.    [provider]  hydrALAZINE (APRESOLINE) 50 MG tablet Take 1 tablet (50 mg total) by mouth 3 (three) times daily. 10/04/16 10/19/17  Nahser, Wonda Cheng, MD  levETIRAcetam (KEPPRA) 500 MG tablet Take 500 mg by mouth 2 (two) times daily.    [provider]  potassium chloride (K-DUR) 10 MEQ tablet TAKE 2 TABLETS(20 MEQ) BY MOUTH TWICE DAILY 08/06/17   Richardson Dopp T, [place]    Physical Exam:  Vitals:   11/05/17 1505 11/05/17 1515 11/05/17 1530 11/05/17 1545  BP: (!) 198/59 (!) 178/55 (!) 183/56 (!) 174/58  Pulse: (!) 58 62 62 63  Resp: 20 (!) 21 (!) 28 19  SpO2: 100% 99% 100% 100%   Constitutional: NAD, calm, comfortable, looks younger than her stated age Eyes: PERRL, lids and conjunctivae normal ENMT: Mucous membranes are moist, without exudate or lesions  Neck: normal, supple, no masses, no thyromegaly Respiratory: clear to auscultation bilaterally, no wheezing, no crackles. Normal respiratory effort  Cardiovascular:Chronically brady  rate and rhythm, 2/6  murmur, rubs or gallops. No extremity edema. 2+ pedal pulses. No carotid bruits.  Abdomen: Soft, non tender, No hepatosplenomegaly. Bowel sounds positive.  Musculoskeletal: no clubbing / cyanosis. Moves all extremities Skin: no jaundice, No lesions.  Neurologic: Sensation intact  Strength equal in all extremities Psychiatric:   Alert and oriented x 3. Normal mood.     Labs on Admission: I have personally reviewed following labs and imaging  studies  CBC: Recent Labs  Lab 11/05/17 1238  WBC 6.1  NEUTROABS 4.1  HGB 10.4*  HCT 31.4*  MCV 89.0  PLT 027    Basic Metabolic Panel: Recent Labs  Lab 11/05/17 1238  NA 137  K 4.2  CL 111  CO2 17*  GLUCOSE 90  BUN 52*  CREATININE 4.07*  CALCIUM 10.2    GFR: CrCl cannot be calculated (Unknown ideal weight.).  Liver Function Tests: Recent Labs  Lab 11/05/17 1238  AST 16  ALT 12*  ALKPHOS 60  BILITOT 0.8  PROT 6.7  ALBUMIN 3.9   No results for input(s): LIPASE,  AMYLASE in the last 168 hours. No results for input(s): AMMONIA in the last 168 hours.  Coagulation Profile: No results for input(s): INR, PROTIME in the last 168 hours.  Cardiac Enzymes: No results for input(s): CKTOTAL, CKMB, CKMBINDEX, TROPONINI in the last 168 hours.  BNP (last 3 results) No results for input(s): PROBNP in the last 8760 hours.  HbA1C: No results for input(s): HGBA1C in the last 72 hours.  CBG: No results for input(s): GLUCAP in the last 168 hours.  Lipid Profile: No results for input(s): CHOL, HDL, LDLCALC, TRIG, CHOLHDL, LDLDIRECT in the last 72 hours.  Thyroid Function Tests: No results for input(s): TSH, T4TOTAL, FREET4, T3FREE, THYROIDAB in the last 72 hours.  Anemia Panel: No results for input(s): VITAMINB12, FOLATE, FERRITIN, TIBC, IRON, RETICCTPCT in the last 72 hours.  Urine analysis:    Component Value Date/Time   COLORURINE YELLOW 10/13/2016 1430   APPEARANCEUR CLOUDY (A) 10/13/2016 1430   LABSPEC 1.015 10/13/2016 1430   PHURINE 5.5 10/13/2016 1430   GLUCOSEU NEGATIVE 10/13/2016 1430   HGBUR NEGATIVE 10/13/2016 1430   BILIRUBINUR NEGATIVE 10/13/2016 1430   KETONESUR NEGATIVE 10/13/2016 1430   PROTEINUR 100 (A) 10/13/2016 1430   UROBILINOGEN 0.2 08/18/2013 1049   NITRITE NEGATIVE 10/13/2016 1430   LEUKOCYTESUR NEGATIVE 10/13/2016 1430    Sepsis Labs: @LABRCNTIP (procalcitonin:4,lacticidven:4) )No results found for this or any previous visit  (from the past 240 hour(s)).   Radiological Exams on Admission: No results found.  EKG: Independently reviewed.  Assessment/Plan Active Problems:   Essential hypertension   Seizure disorder (HCC)   Anemia, iron deficiency   Coronary artery disease involving native coronary artery of native heart without angina pectoris   Chronic diastolic CHF (congestive heart failure) (HCC)   Hypertensive urgency   Hypertensive urgency in the setting of h/o Renal Artery Stenosis and recently running out of BP meds  Prior to admission was highest at 626 systolic, currently at 948/54, has been receiving Hydralazine at the ED as well as oral Norvasc 10 mg, continuing to have elevated blood pressures.  Patient was compliant with her medications she is essentially asymptomatic Admit to telemetry observation Continue home meds in a.m., she took her morning dose TSH Check serial Troponin  Hydralazine 5-10 mg IV q 6 hrs prn for BP 160/90  Patient will need to follow with nephrology and cardiology as outpatient.  Chronic kidney disease stage 4, with worsening renal function   baseline creatinine 3   Current Cr 4.07  Lab Results  Component Value Date   CREATININE 4.07 (H) 11/05/2017   CREATININE 3.06 (H) 11/14/2016   CREATININE 3.38 (H) 10/13/2016  IVF Patient took her diuretic dose, will evaluate tomorrow if she is to receive that based on her worsening renal function.  If these functions do not improve, will consider renal consultation at the hospital Repeat CMET in am   Avoid NSAIDs  CAD/ Chronic diastolic heart failure, history of sinus bradycardia patient is cardiac pain free at this time. Tn 0.03 SR with LBBB no acute changes.  EF 62-70%, normal systolic, grade 1 diastolic as of 3/50/0938 Continue  Plavixhome medicines as above. Patient to follow-up as an outpatient cardiology unless her cardiac status worsens  History of seizures, none recently Continue Keppra     Anemia of chronic disease  and Iron deficiency  Hemoglobin on admission 10.4  At baseline  Hcult   Repeat CBC in am  No transfusion is indicated at this time   DVT prophylaxis:  Heparin  Code Status:    FUll  Family Communication:  Discussed with patient Disposition Plan: Expect patient to be discharged to home after condition improves Consults called:    None  Admission status: Tele Obs    Sharene Butters, PA-C [facility]   11/05/2017, 5:19 PM

## 2017-11-05 NOTE — Plan of Care (Signed)
  Clinical Measurements: Ability to maintain clinical measurements within normal limits will improve 11/05/2017 2320 - Progressing by Tristan Schroeder, RN   Clinical Measurements: Diagnostic test results will improve 11/05/2017 2320 - Progressing by Tristan Schroeder, RN

## 2017-11-05 NOTE — Progress Notes (Signed)
Patients SBP in 200's , MD paged, made aware. Given Lopressor 5mg  IV per order. Will monitor accordingly.

## 2017-11-05 NOTE — ED Provider Notes (Signed)
[facility]ENCY DEPART[facility] Provider Note   CSN: [id number] Arrival date & time: 11/05/17  1106     History   Chief Complaint Chief Complaint  Patient presents with  . Hypertension    HPI [name] is a 83 y.o. female.  HPI  83 year old female who presents with elevated blood pressures.  She has a history of coronary artery disease, hypertension, and renal artery stenosis.  She lives by herself in a home health nurse came by today noticed that her blood pressure was very elevated.  Patient does report that she has been out of amlodipine for the past 2 days but has been compliant with her hydralazine.  Did feel little dizzy this morning, but otherwise feels that she has been in her usual state of health.  Denies headache, vision or speech changes, difficulty walking, focal numbness or weakness, chest pain or difficulty breathing, edema, orthopnea or PND.  She has had normal urine output but states that she has chronic kidney disease and has been told by her nephrologist that she may have had some mildly worsening renal function recently.  Past Medical History:  Diagnosis Date  . Anxiety   . AVM (arteriovenous malformation) of colon   . CAD (coronary artery disease)   . Cervical polyp   . Chronic kidney disease   . Diverticulosis   . Esophageal stricture   . GERD (gastroesophageal reflux disease)   . Hiatal hernia   . Hypertension   . Iron deficiency anemia   . Osteoarthritis   . Seizures ([place])   . Tubular adenoma of colon 2014  . Ventricular hypertrophy     Patient Active Problem List   Diagnosis Date Noted  . Chronic diastolic CHF (congestive heart failure) ([place]) 10/19/2017  . Acute kidney injury superimposed on chronic kidney disease ([place]) 10/06/2016  . Metabolic acidosis 02/72/5366  . Dehydration with hyponatremia 10/06/2016  . Sinus bradycardia 10/03/2016  . Hypokalemia 12/10/2015  . DNR (do not resuscitate)   . Palliative care  encounter   . Weakness   . Hyperkalemia 12/09/2015  . Hypernatremia 12/09/2015  . Troponin level elevated 12/09/2015  . Anemia, iron deficiency 12/09/2015  . Coronary artery disease involving native coronary artery of native heart without angina pectoris 12/09/2015  . Nausea & vomiting 12/08/2015  . Seizure disorder ([place]) 02/05/2008  . Essential hypertension 06/22/2007    Past Surgical History:  Procedure Laterality Date  . ANGIOPLASTY  1999  . Bilateral Total Knee Replacements     Left Knee-12/29/2003, Right Knee-04/10/2002  . BREAST SURGERY    . CARDIAC CATHETERIZATION    . CARPAL TUNNEL RELEASE    . CATARACT EXTRACTION    . COLONOSCOPY    . COLONOSCOPY W/ BIOPSIES    . CORONARY ANGIOPLASTY    . INGUINAL HERNIA REPAIR  08/08/2006   RIGHT INGUINAL HERNIA REPAIR WITH MESH  . JOINT REPLACEMENT    . KNEE ARTHROSCOPY    . LAPAROSCOPIC APPENDECTOMY    . LAPAROSCOPIC CHOLECYSTECTOMY    . MAJOR DUCT EXCISION OF LEFT BREAST  06/01/2004  . RIGHT SHOULDER ARTHROSCOPY  12/18/2006  . Right Total Hip Athroplasty  01/23/2002  . TONSILLECTOMY      OB History    No data available       Home Medications    Prior to Admission medications   Medication Sig Start Date End Date Taking? Authorizing Provider  amLODipine (NORVASC) 10 MG tablet TAKE 1 TABLET(10 MG) BY MOUTH DAILY 11/05/17  Nahser, Wonda Cheng, MD  clopidogrel (PLAVIX) 75 MG tablet Take 75 mg by mouth daily.  08/21/14   [provider]  doxazosin (CARDURA) 8 MG tablet TAKE 1 TABLET(8 MG) BY MOUTH DAILY 11/05/17   Nahser, Wonda Cheng, MD  furosemide (LASIX) 80 MG tablet Take 80 mg by mouth daily.    [provider]  hydrALAZINE (APRESOLINE) 50 MG tablet Take 1 tablet (50 mg total) by mouth 3 (three) times daily. 10/04/16 10/19/17  Nahser, Wonda Cheng, MD  levETIRAcetam (KEPPRA) 500 MG tablet Take 500 mg by mouth 2 (two) times daily.    [provider]  potassium chloride (K-DUR) 10 MEQ tablet TAKE 2 TABLETS(20 MEQ)  BY MOUTH TWICE DAILY 08/06/17   Liliane Shi, PA-C    Family History Family History  Problem Relation Age of Onset  . Hypertension Mother   . Stroke Mother   . Cancer Father   . Cancer Sister   . Other Brother        brain tumor    Social History Social History   Tobacco Use  . Smoking status: Former Research scientist (life sciences)  . Smokeless tobacco: Never Used  Substance Use Topics  . Alcohol use: No  . Drug use: No     Allergies   Haldol [haloperidol decanoate]; Lorazepam; and Cephalexin   Review of Systems Review of Systems  Constitutional: Negative for fatigue and fever.  Respiratory: Negative for shortness of breath.   Cardiovascular: Negative for chest pain.  Gastrointestinal: Negative for abdominal pain.  Genitourinary: Negative for difficulty urinating.  Neurological: Positive for dizziness. Negative for syncope, speech difficulty, weakness and headaches.     Physical Exam Updated Vital Signs BP (!) 229/69   Pulse (!) 58   Resp (!) 25   SpO2 100%   Physical Exam Physical Exam  Nursing note and vitals reviewed. Constitutional: Well developed, well nourished, non-toxic, and in no acute distress Head: Normocephalic and atraumatic.  Mouth/Throat: Oropharynx is clear and moist.  Neck: Normal range of motion. Neck supple.  Cardiovascular: Normal rate and regular rhythm.   Pulmonary/Chest: Effort normal and breath sounds normal.  Abdominal: Soft. There is no tenderness. There is no rebound and no guarding.  Musculoskeletal: Normal range of motion.  Neurological: Alert, no facial droop, fluent speech, moves all extremities symmetrically, PERRL, sensation to light touch in tact throughout, no pronator drift, no dysmetria with finger to nose Skin: Skin is warm and dry.  Psychiatric: Cooperative   ED Treatments / Results  Labs (all labs ordered are listed, but only abnormal results are displayed) Labs Reviewed  CBC WITH DIFFERENTIAL/PLATELET - Abnormal; Notable for the  following components:      Result Value   RBC 3.53 (*)    Hemoglobin 10.4 (*)    HCT 31.4 (*)    All other components within normal limits  COMPREHENSIVE METABOLIC PANEL - Abnormal; Notable for the following components:   CO2 17 (*)    BUN 52 (*)    Creatinine, Ser 4.07 (*)    ALT 12 (*)    GFR calc non Af Amer 9 (*)    GFR calc Af Amer 11 (*)    All other components within normal limits    EKG  EKG Interpretation  Date/Time:  Monday November 05 2017 11:47:52 EST Ventricular Rate:  57 PR Interval:    QRS Duration: 157 QT Interval:  477 QTC Calculation: 465 R Axis:   18 Text Interpretation:  Sinus rhythm Left bundle branch block  no acute changes  Confirmed by Brantley Stage [phone]) on 11/05/2017 12:47:45 PM       Radiology No results found.  Procedures Procedures (including critical care time)  Medications Ordered in ED Medications  hydrALAZINE (APRESOLINE) injection 10 mg (not administered)  amLODipine (NORVASC) tablet 10 mg (10 mg Oral Given 11/05/17 1215)     Initial Impression / Assessment and Plan / ED Course  I have reviewed the triage vital signs and the nursing notes.  Pertinent labs & imaging results that were available during my care of the patient were reviewed by me and considered in my medical decision making (see chart for details).     Presenting with hypertensive urgency.  Blood pressures 432X-614 systolic.  Has missed her amlodipine at home for the past 2days and compliant with her hydralazine.  Records are reviewed and her blood pressure was 709 systolic in the cardiologist office 2 weeks ago.  I do not suspect that the drastic difference in her blood pressure is related to missing amlodipine.  He did receive a dose of her amlodipine here, but given 10 mg of hydralazine IV.  She is overall asymptomatic but blood work does have worsening renal function with a creatinine of 4. Baseline creatinine of 3. No dehydration history or GI loss of history to  suggest possible AKI from hypovolemia or dehydration. Concerned worsening renal function associated with worsening blood pressure. Discussed with hospitalist service who will admit for observation for hypertensive urgency in the setting of worsening renal function  Final Clinical Impressions(s) / ED Diagnoses   Final diagnoses:  Hypertensive urgency  Acute kidney injury [facility])    ED Discharge Orders    None       Forde Dandy, MD 11/05/17 1414

## 2017-11-06 DIAGNOSIS — I2510 Atherosclerotic heart disease of native coronary artery without angina pectoris: Secondary | ICD-10-CM | POA: Diagnosis not present

## 2017-11-06 DIAGNOSIS — I447 Left bundle-branch block, unspecified: Secondary | ICD-10-CM | POA: Diagnosis present

## 2017-11-06 DIAGNOSIS — I130 Hypertensive heart and chronic kidney disease with heart failure and stage 1 through stage 4 chronic kidney disease, or unspecified chronic kidney disease: Secondary | ICD-10-CM | POA: Diagnosis present

## 2017-11-06 DIAGNOSIS — D631 Anemia in chronic kidney disease: Secondary | ICD-10-CM | POA: Diagnosis present

## 2017-11-06 DIAGNOSIS — N179 Acute kidney failure, unspecified: Secondary | ICD-10-CM | POA: Diagnosis not present

## 2017-11-06 DIAGNOSIS — Z23 Encounter for immunization: Secondary | ICD-10-CM | POA: Diagnosis not present

## 2017-11-06 DIAGNOSIS — Z9861 Coronary angioplasty status: Secondary | ICD-10-CM | POA: Diagnosis not present

## 2017-11-06 DIAGNOSIS — Z9114 Patient's other noncompliance with medication regimen: Secondary | ICD-10-CM | POA: Diagnosis not present

## 2017-11-06 DIAGNOSIS — Z888 Allergy status to other drugs, medicaments and biological substances status: Secondary | ICD-10-CM | POA: Diagnosis not present

## 2017-11-06 DIAGNOSIS — Z881 Allergy status to other antibiotic agents status: Secondary | ICD-10-CM | POA: Diagnosis not present

## 2017-11-06 DIAGNOSIS — I160 Hypertensive urgency: Secondary | ICD-10-CM | POA: Diagnosis not present

## 2017-11-06 DIAGNOSIS — F419 Anxiety disorder, unspecified: Secondary | ICD-10-CM | POA: Diagnosis present

## 2017-11-06 DIAGNOSIS — G40909 Epilepsy, unspecified, not intractable, without status epilepticus: Secondary | ICD-10-CM | POA: Diagnosis not present

## 2017-11-06 DIAGNOSIS — Z8249 Family history of ischemic heart disease and other diseases of the circulatory system: Secondary | ICD-10-CM | POA: Diagnosis not present

## 2017-11-06 DIAGNOSIS — N184 Chronic kidney disease, stage 4 (severe): Secondary | ICD-10-CM

## 2017-11-06 DIAGNOSIS — I161 Hypertensive emergency: Secondary | ICD-10-CM | POA: Diagnosis present

## 2017-11-06 DIAGNOSIS — M1990 Unspecified osteoarthritis, unspecified site: Secondary | ICD-10-CM | POA: Diagnosis present

## 2017-11-06 DIAGNOSIS — Z87891 Personal history of nicotine dependence: Secondary | ICD-10-CM | POA: Diagnosis not present

## 2017-11-06 DIAGNOSIS — K219 Gastro-esophageal reflux disease without esophagitis: Secondary | ICD-10-CM | POA: Diagnosis present

## 2017-11-06 DIAGNOSIS — N185 Chronic kidney disease, stage 5: Secondary | ICD-10-CM | POA: Diagnosis not present

## 2017-11-06 DIAGNOSIS — E876 Hypokalemia: Secondary | ICD-10-CM | POA: Diagnosis present

## 2017-11-06 DIAGNOSIS — I10 Essential (primary) hypertension: Secondary | ICD-10-CM | POA: Diagnosis not present

## 2017-11-06 DIAGNOSIS — D509 Iron deficiency anemia, unspecified: Secondary | ICD-10-CM | POA: Diagnosis not present

## 2017-11-06 DIAGNOSIS — I120 Hypertensive chronic kidney disease with stage 5 chronic kidney disease or end stage renal disease: Secondary | ICD-10-CM | POA: Diagnosis not present

## 2017-11-06 DIAGNOSIS — Z7902 Long term (current) use of antithrombotics/antiplatelets: Secondary | ICD-10-CM | POA: Diagnosis not present

## 2017-11-06 DIAGNOSIS — I5032 Chronic diastolic (congestive) heart failure: Secondary | ICD-10-CM | POA: Diagnosis not present

## 2017-11-06 DIAGNOSIS — Z96653 Presence of artificial knee joint, bilateral: Secondary | ICD-10-CM | POA: Diagnosis present

## 2017-11-06 DIAGNOSIS — I701 Atherosclerosis of renal artery: Secondary | ICD-10-CM | POA: Diagnosis present

## 2017-11-06 LAB — URINALYSIS, ROUTINE W REFLEX MICROSCOPIC
BACTERIA UA: NONE SEEN
Bilirubin Urine: NEGATIVE
GLUCOSE, UA: NEGATIVE mg/dL
Hgb urine dipstick: NEGATIVE
KETONES UR: NEGATIVE mg/dL
LEUKOCYTES UA: NEGATIVE
Nitrite: NEGATIVE
PROTEIN: 100 mg/dL — AB
Specific Gravity, Urine: 1.009 (ref 1.005–1.030)
pH: 5 (ref 5.0–8.0)

## 2017-11-06 LAB — COMPREHENSIVE METABOLIC PANEL
ALBUMIN: 3.4 g/dL — AB (ref 3.5–5.0)
ALT: 11 U/L — ABNORMAL LOW (ref 14–54)
ANION GAP: 10 (ref 5–15)
AST: 14 U/L — ABNORMAL LOW (ref 15–41)
Alkaline Phosphatase: 58 U/L (ref 38–126)
BUN: 49 mg/dL — ABNORMAL HIGH (ref 6–20)
CO2: 16 mmol/L — AB (ref 22–32)
CREATININE: 3.98 mg/dL — AB (ref 0.44–1.00)
Calcium: 9.6 mg/dL (ref 8.9–10.3)
Chloride: 112 mmol/L — ABNORMAL HIGH (ref 101–111)
GFR calc non Af Amer: 9 mL/min — ABNORMAL LOW (ref >60)
GFR, EST AFRICAN AMERICAN: 11 mL/min — AB (ref >60)
GLUCOSE: 83 mg/dL (ref 65–99)
POTASSIUM: 3.8 mmol/L (ref 3.5–5.1)
SODIUM: 138 mmol/L (ref 135–145)
Total Bilirubin: 0.6 mg/dL (ref 0.3–1.2)
Total Protein: 6 g/dL — ABNORMAL LOW (ref 6.5–8.1)

## 2017-11-06 LAB — CBC
HEMATOCRIT: 29.4 % — AB (ref 36.0–46.0)
Hemoglobin: 9.7 g/dL — ABNORMAL LOW (ref 12.0–15.0)
MCH: 29.1 pg (ref 26.0–34.0)
MCHC: 33 g/dL (ref 30.0–36.0)
MCV: 88.3 fL (ref 78.0–100.0)
PLATELETS: 239 10*3/uL (ref 150–400)
RBC: 3.33 MIL/uL — AB (ref 3.87–5.11)
RDW: 13.1 % (ref 11.5–15.5)
WBC: 6.3 10*3/uL (ref 4.0–10.5)

## 2017-11-06 NOTE — Progress Notes (Signed)
PROGRESS NOTE    [name]  RKY:[id number] DOB: [birth year] DOA: 11/05/2017 PCP: Lucianne Lei, MD   Outpatient Specialists:[facility]     Brief Narrative:  [name] is a 83 y.o. female who presents with Hypertensive emergency secondary to missed doses of medicaitons and poor response to therapies in ED w/ concurrent AKI.    Assessment & Plan:   Active Problems:   Essential hypertension   Seizure disorder (HCC)   Anemia, iron deficiency   Coronary artery disease involving native coronary artery of native heart without angina pectoris   Chronic diastolic CHF (congestive heart failure) (HCC)   Hypertensive urgency    Hypertensive emergency in the setting of h/o Renal Artery Stenosis and recently running out of BP meds   -resume home meds -TSH ok  Chronic kidney disease stage 4 -recheck BMP in AM -follows with Dr. Florene Glen  CAD/ Chronic diastolic heart failure, history of sinus bradycardia patient is cardiac pain free at this time. Tn 0.03 SR with LBBB no acute changes.  EF 31-51%, normal systolic, grade 1 diastolic as of 7/61/6073  History of seizures, none recently Continue Keppra     Anemia of chronic disease and Iron deficiency  Hemoglobin on admission 10.4     DVT prophylaxis:  SQ Heparin  Code Status: Full Code   Family Communication:   Disposition Plan:  Home in AM if BP and labs stable   Consultants:      Subjective: Feeling better today  Objective: Vitals:   11/05/17 1944 11/06/17 0121 11/06/17 0540 11/06/17 1100  BP: (!) 210/69 (!) 171/54 (!) 187/52 (!) 164/51  Pulse: (!) 56 (!) 54 (!) 56 69  Resp: 18 18 18    Temp: 99.3 F (37.4 C) 98 F (36.7 C) 99.4 F (37.4 C)   TempSrc: Oral Oral Oral   SpO2: 100% 100% 99%   Weight:   64 kg (141 lb 1.6 oz)   Height:        Intake/Output Summary (Last 24 hours) at 11/06/2017 1257 Last data filed at 11/06/2017 1100 Gross per 24 hour  Intake 1331.67 ml  Output 950 ml  Net 381.67  ml   Filed Weights   11/05/17 1817 11/06/17 0540  Weight: 65.8 kg (145 lb) 64 kg (141 lb 1.6 oz)    Examination:  General exam: Appears calm and comfortable  Respiratory system: no wheezing Cardiovascular system: S1 & S2 heard, RRR. No JVD, murmurs, rubs, gallops or clicks. min pedal edema. Gastrointestinal system: Abdomen is nondistended, soft and nontender. No organomegaly or masses felt. Normal bowel sounds heard. Central nervous system: Alert and oriented. No focal neurological deficits. Extremities: Symmetric 5 x 5 power. Skin: No rashes, lesions or ulcers Psychiatry: Judgement and insight appear normal. Mood & affect appropriate.     Data Reviewed: I have personally reviewed following labs and imaging studies  CBC: Recent Labs  Lab 11/05/17 1238 11/06/17 0334  WBC 6.1 6.3  NEUTROABS 4.1  --   HGB 10.4* 9.7*  HCT 31.4* 29.4*  MCV 89.0 88.3  PLT 235 710   Basic Metabolic Panel: Recent Labs  Lab 11/05/17 1238 11/06/17 0334  NA 137 138  K 4.2 3.8  CL 111 112*  CO2 17* 16*  GLUCOSE 90 83  BUN 52* 49*  CREATININE 4.07* 3.98*  CALCIUM 10.2 9.6   GFR: Estimated Creatinine Clearance: 8.4 mL/min (A) (by C-G formula based on SCr of 3.98 mg/dL (H)). Liver Function Tests: Recent Labs  Lab 11/05/17 1238 11/06/17 0334  AST 16 14*  ALT 12* 11*  ALKPHOS 60 58  BILITOT 0.8 0.6  PROT 6.7 6.0*  ALBUMIN 3.9 3.4*   No results for input(s): LIPASE, AMYLASE in the last 168 hours. No results for input(s): AMMONIA in the last 168 hours. Coagulation Profile: No results for input(s): INR, PROTIME in the last 168 hours. Cardiac Enzymes: No results for input(s): CKTOTAL, CKMB, CKMBINDEX, TROPONINI in the last 168 hours. BNP (last 3 results) No results for input(s): PROBNP in the last 8760 hours. HbA1C: No results for input(s): HGBA1C in the last 72 hours. CBG: No results for input(s): GLUCAP in the last 168 hours. Lipid Profile: No results for input(s): CHOL, HDL,  LDLCALC, TRIG, CHOLHDL, LDLDIRECT in the last 72 hours. Thyroid Function Tests: Recent Labs    11/05/17 1847  TSH 1.657   Anemia Panel: No results for input(s): VITAMINB12, FOLATE, FERRITIN, TIBC, IRON, RETICCTPCT in the last 72 hours. Urine analysis:    Component Value Date/Time   COLORURINE STRAW (A) 11/06/2017 1154   APPEARANCEUR CLEAR 11/06/2017 1154   LABSPEC 1.009 11/06/2017 1154   PHURINE 5.0 11/06/2017 1154   GLUCOSEU NEGATIVE 11/06/2017 1154   HGBUR NEGATIVE 11/06/2017 1154   BILIRUBINUR NEGATIVE 11/06/2017 1154   KETONESUR NEGATIVE 11/06/2017 1154   PROTEINUR 100 (A) 11/06/2017 1154   UROBILINOGEN 0.2 08/18/2013 1049   NITRITE NEGATIVE 11/06/2017 1154   LEUKOCYTESUR NEGATIVE 11/06/2017 1154    )No results found for this or any previous visit (from the past 240 hour(s)).    Anti-infectives (From admission, onward)   None       Radiology Studies: No results found.      Scheduled Meds: . amLODipine  10 mg Oral Daily  . clopidogrel  75 mg Oral Daily  . doxazosin  8 mg Oral Daily  . furosemide  80 mg Oral Daily  . heparin  5,000 Units Subcutaneous Q8H  . hydrALAZINE  50 mg Oral Q8H  . levETIRAcetam  500 mg Oral BID  . pneumococcal 13-valent conjugate vaccine  0.5 mL Intramuscular Tomorrow-1000  . potassium chloride  10 mEq Oral BID   Continuous Infusions:   LOS: 0 days    Time spent: 35 min    Geradine Girt, DO [facility] Pager [phone]  If 7PM-7AM, please contact night-coverage [email or web address] Password [facility] 11/06/2017, 12:57 PM

## 2017-11-06 NOTE — Progress Notes (Signed)
Patient stable, report given to current RN.

## 2017-11-06 NOTE — Care Management Obs Status (Signed)
[place] NOTIFICATION   Patient Details  Name: [name] MRN: [id number] Date of Birth: 09-Sep-1930   Medicare Observation Status Notification Given:  Yes    Carles Collet, RN 11/06/2017, 9:46 AM

## 2017-11-06 NOTE — Plan of Care (Signed)
  Health Behavior/Discharge Planning: Ability to manage health-related needs will improve 11/06/2017 2335 - Progressing by Tristan Schroeder, RN   Clinical Measurements: Ability to maintain clinical measurements within normal limits will improve 11/06/2017 2335 - Progressing by Tristan Schroeder, RN

## 2017-11-07 LAB — BASIC METABOLIC PANEL
ANION GAP: 11 (ref 5–15)
BUN: 53 mg/dL — ABNORMAL HIGH (ref 6–20)
CALCIUM: 9.7 mg/dL (ref 8.9–10.3)
CO2: 16 mmol/L — AB (ref 22–32)
CREATININE: 4.4 mg/dL — AB (ref 0.44–1.00)
Chloride: 110 mmol/L (ref 101–111)
GFR, EST AFRICAN AMERICAN: 10 mL/min — AB (ref >60)
GFR, EST NON AFRICAN AMERICAN: 8 mL/min — AB (ref >60)
GLUCOSE: 113 mg/dL — AB (ref 65–99)
Potassium: 3.1 mmol/L — ABNORMAL LOW (ref 3.5–5.1)
Sodium: 137 mmol/L (ref 135–145)

## 2017-11-07 MED ORDER — HYDRALAZINE HCL 50 MG PO TABS
75.0000 mg | ORAL_TABLET | Freq: Three times a day (TID) | ORAL | Status: DC
  Administered 2017-11-07 – 2017-11-09 (×7): 75 mg via ORAL
  Filled 2017-11-07 (×7): qty 1

## 2017-11-07 NOTE — Progress Notes (Signed)
PROGRESS NOTE    [name]  ZOX:[id number] DOB: 23-Sep-1930 DOA: 11/05/2017 PCP: Lucianne Lei, MD   Outpatient Specialists:[facility]     Brief Narrative:  [name] is a 83 y.o. female who presents with Hypertensive emergency secondary to missed doses of medicaitons and poor response to therapies in ED w/ concurrent AKI.     Assessment & Plan:   Active Problems:   Essential hypertension   Seizure disorder (HCC)   Anemia, iron deficiency   Coronary artery disease involving native coronary artery of native heart without angina pectoris   Chronic diastolic CHF (congestive heart failure) (HCC)   Hypertensive urgency    Hypertensive emergency in the setting of h/o Renal Artery Stenosis and recently running out of BP meds   -resume home meds -adjust hydralazine up as BP still remaining > 150 -TSH ok -have consulted Dr. Florene Glen   Chronic kidney disease stage 4 -Cr worsening -have held lasix until seen by renal -no signs of uremia -consult Dr. Florene Glen  CAD/ Chronic diastolic heart failure, history of sinus bradycardia patient is cardiac pain free at this time. Tn 0.03 SR with LBBB no acute changes.  EF 81-19%, normal systolic, grade 1 diastolic as of 1/47/8295  History of seizures, none recently Continue Keppra  Anemia of chronic disease and Iron deficiency  Hemoglobin on admission 10.4 -defer to renal  Hypokalemia -replete with PO K -monitor daily    DVT prophylaxis:  SQ Heparin  Code Status: Full Code   Family Communication:   Disposition Plan:  Pending work up   Consultants:   renal   Subjective: Patient does not have symptoms when BP goes up  Objective: Vitals:   11/06/17 1546 11/06/17 1930 11/07/17 0641 11/07/17 1120  BP: (!) 149/54 (!) 170/61 (!) 176/54 (!) 172/56  Pulse: 67 68 60 69  Resp:  18 18 20   Temp:  98.1 F (36.7 C) 97.9 F (36.6 C) 98.3 F (36.8 C)  TempSrc:  Oral Oral Oral  SpO2:  99% 98% 100%  Weight:   64 kg  (141 lb 3.2 oz)   Height:        Intake/Output Summary (Last 24 hours) at 11/07/2017 1138 Last data filed at 11/07/2017 0900 Gross per 24 hour  Intake 900 ml  Output 1550 ml  Net -650 ml   Filed Weights   11/05/17 1817 11/06/17 0540 11/07/17 0641  Weight: 65.8 kg (145 lb) 64 kg (141 lb 1.6 oz) 64 kg (141 lb 3.2 oz)    Examination:  General exam: NAD, in bed Respiratory system: clear Cardiovascular system:rrr Gastrointestinal system: Abdomen is nondistended, soft and nontender. No organomegaly or masses felt. Normal bowel sounds heard. Central nervous system:no deficits Extremities: moves all 4 ext      Data Reviewed: I have personally reviewed following labs and imaging studies  CBC: Recent Labs  Lab 11/05/17 1238 11/06/17 0334  WBC 6.1 6.3  NEUTROABS 4.1  --   HGB 10.4* 9.7*  HCT 31.4* 29.4*  MCV 89.0 88.3  PLT 235 621   Basic Metabolic Panel: Recent Labs  Lab 11/05/17 1238 11/06/17 0334 11/07/17 0926  NA 137 138 137  K 4.2 3.8 3.1*  CL 111 112* 110  CO2 17* 16* 16*  GLUCOSE 90 83 113*  BUN 52* 49* 53*  CREATININE 4.07* 3.98* 4.40*  CALCIUM 10.2 9.6 9.7   GFR: Estimated Creatinine Clearance: 7.6 mL/min (A) (by C-G formula based on SCr of 4.4 mg/dL (H)). Liver Function Tests: Recent Labs  Lab 11/05/17 1238 11/06/17 0334  AST 16 14*  ALT 12* 11*  ALKPHOS 60 58  BILITOT 0.8 0.6  PROT 6.7 6.0*  ALBUMIN 3.9 3.4*   No results for input(s): LIPASE, AMYLASE in the last 168 hours. No results for input(s): AMMONIA in the last 168 hours. Coagulation Profile: No results for input(s): INR, PROTIME in the last 168 hours. Cardiac Enzymes: No results for input(s): CKTOTAL, CKMB, CKMBINDEX, TROPONINI in the last 168 hours. BNP (last 3 results) No results for input(s): PROBNP in the last 8760 hours. HbA1C: No results for input(s): HGBA1C in the last 72 hours. CBG: No results for input(s): GLUCAP in the last 168 hours. Lipid Profile: No results for  input(s): CHOL, HDL, LDLCALC, TRIG, CHOLHDL, LDLDIRECT in the last 72 hours. Thyroid Function Tests: Recent Labs    11/05/17 1847  TSH 1.657   Anemia Panel: No results for input(s): VITAMINB12, FOLATE, FERRITIN, TIBC, IRON, RETICCTPCT in the last 72 hours. Urine analysis:    Component Value Date/Time   COLORURINE STRAW (A) 11/06/2017 1154   APPEARANCEUR CLEAR 11/06/2017 1154   LABSPEC 1.009 11/06/2017 1154   PHURINE 5.0 11/06/2017 1154   GLUCOSEU NEGATIVE 11/06/2017 1154   HGBUR NEGATIVE 11/06/2017 1154   BILIRUBINUR NEGATIVE 11/06/2017 1154   KETONESUR NEGATIVE 11/06/2017 1154   PROTEINUR 100 (A) 11/06/2017 1154   UROBILINOGEN 0.2 08/18/2013 1049   NITRITE NEGATIVE 11/06/2017 1154   LEUKOCYTESUR NEGATIVE 11/06/2017 1154    )No results found for this or any previous visit (from the past 240 hour(s)).    Anti-infectives (From admission, onward)   None       Radiology Studies: No results found.      Scheduled Meds: . amLODipine  10 mg Oral Daily  . clopidogrel  75 mg Oral Daily  . doxazosin  8 mg Oral Daily  . heparin  5,000 Units Subcutaneous Q8H  . hydrALAZINE  75 mg Oral Q8H  . levETIRAcetam  500 mg Oral BID  . pneumococcal 13-valent conjugate vaccine  0.5 mL Intramuscular Tomorrow-1000  . potassium chloride  10 mEq Oral BID   Continuous Infusions:   LOS: 1 day    Time spent: 25 min    Geradine Girt, DO [facility] Pager [phone]  If 7PM-7AM, please contact night-coverage [email or web address] Password [facility] 11/07/2017, 11:38 AM

## 2017-11-07 NOTE — Plan of Care (Signed)
Reviewed admission packet and answered questions re:  HTN management.

## 2017-11-07 NOTE — Plan of Care (Signed)
  Clinical Measurements: Ability to maintain clinical measurements within normal limits will improve 11/07/2017 2258 - Progressing by Tristan Schroeder, RN   Nutrition: Adequate nutrition will be maintained 11/07/2017 2258 - Progressing by Tristan Schroeder, RN

## 2017-11-07 NOTE — Consult Note (Signed)
[name] is a 83 y.o. female with medical history significant for CKD, hypertension, renal artery stenosis s/p PTA , there is an atrophic left kidney, CAD, GERD,  iron deficiency anemia, history of seizures, brought by EMS, after her home nurse came and noticed that her blood pressure was very elevated in the 240s.  At the time of presentation, she did report that she has been out of amlodipine for the last 2 days, although she has been compliant with her other medications including hydralazine.   On 07/26/17 creat was 3.22m/dl, 10/15/17 cr 4.42 10/23/17 creat 4.12.  During last OV we discussed ESRD planning.    Past Medical History:  Diagnosis Date  . Anxiety   . AVM (arteriovenous malformation) of colon   . CAD (coronary artery disease)   . Cervical polyp   . Chronic kidney disease   . Diverticulosis   . Esophageal stricture   . GERD (gastroesophageal reflux disease)   . Hiatal hernia   . Hypertension   . Iron deficiency anemia   . Osteoarthritis   . Seizures ([place]   . Tubular adenoma of colon 2014  . Ventricular hypertrophy    Past Surgical History:  Procedure Laterality Date  . ANGIOPLASTY  1999  . Bilateral Total Knee Replacements     Left Knee-12/29/2003, Right Knee-04/10/2002  . BREAST SURGERY    . CARDIAC CATHETERIZATION    . CARPAL TUNNEL RELEASE    . CATARACT EXTRACTION    . COLONOSCOPY    . COLONOSCOPY W/ BIOPSIES    . CORONARY ANGIOPLASTY    . INGUINAL HERNIA REPAIR  08/08/2006   RIGHT INGUINAL HERNIA REPAIR WITH MESH  . JOINT REPLACEMENT    . KNEE ARTHROSCOPY    . LAPAROSCOPIC APPENDECTOMY    . LAPAROSCOPIC CHOLECYSTECTOMY    . MAJOR DUCT EXCISION OF LEFT BREAST  06/01/2004  . RIGHT SHOULDER ARTHROSCOPY  12/18/2006  . Right Total Hip Athroplasty  01/23/2002  . TONSILLECTOMY     Social History:  reports that she has quit smoking. she has never used smokeless tobacco. She reports that she does not drink alcohol or use drugs. Allergies:  Allergies  Allergen  Reactions  . Haldol [Haloperidol Decanoate] Other (See Comments)    Agitation, aggressive and combative  . Lorazepam Other (See Comments)    Agitation, lethargic  . Cephalexin Other (See Comments)    REACTION: Questionable, no reaction listed   Family History  Problem Relation Age of Onset  . Hypertension Mother   . Stroke Mother   . Cancer Father   . Cancer Sister   . Other Brother        brain tumor    Medications:  Prior to Admission:  Medications Prior to Admission  Medication Sig Dispense Refill Last Dose  . amLODipine (NORVASC) 10 MG tablet TAKE 1 TABLET(10 MG) BY MOUTH DAILY 30 tablet 11 11/05/2017 at Unknown time  . clopidogrel (PLAVIX) 75 MG tablet Take 75 mg by mouth daily.    11/05/2017 at 9am  . doxazosin (CARDURA) 8 MG tablet TAKE 1 TABLET(8 MG) BY MOUTH DAILY 30 tablet 11 11/05/2017 at Unknown time  . furosemide (LASIX) 40 MG tablet Take 40 mg by mouth daily.    11/05/2017 at Unknown time  . hydrALAZINE (APRESOLINE) 50 MG tablet Take 1 tablet (50 mg total) by mouth 3 (three) times daily. 90 tablet 11 11/05/2017 at Unknown time  . levETIRAcetam (KEPPRA) 500 MG tablet Take 500 mg by mouth 2 (two) times daily.  11/05/2017 at 9am  . potassium chloride (K-DUR) 10 MEQ tablet TAKE 2 TABLETS(20 MEQ) BY MOUTH TWICE DAILY 120 tablet 6 11/04/2017 at Unknown time   Scheduled: . amLODipine  10 mg Oral Daily  . clopidogrel  75 mg Oral Daily  . doxazosin  8 mg Oral Daily  . heparin  5,000 Units Subcutaneous Q8H  . hydrALAZINE  75 mg Oral Q8H  . levETIRAcetam  500 mg Oral BID  . pneumococcal 13-valent conjugate vaccine  0.5 mL Intramuscular Tomorrow-1000  . potassium chloride  10 mEq Oral BID   ROS: as per HPI Blood pressure (!) 172/56, pulse 69, temperature 98.3 F (36.8 C), temperature source Oral, resp. rate 20, height '4\' 11"'  (1.499 m), weight 64 kg (141 lb 3.2 oz), SpO2 100 %.  General appearance: alert and cooperative Head: Normocephalic, without obvious abnormality,  atraumatic Resp: clear to auscultation bilaterally Chest wall: no tenderness Cardio: systolic murmur: systolic ejection 2/6, crescendo and decrescendo at 2nd right intercostal space GI: soft, non-tender; bowel sounds normal; no masses,  no organomegaly Extremities: extremities normal, atraumatic, no cyanosis or edema and bilat TKR scars Skin: Skin color, texture, turgor normal. No rashes or lesions Neurologic: Grossly normal Results for orders placed or performed during the hospital encounter of 11/05/17 (from the past 48 hour(s))  TSH     Status: None   Collection Time: 11/05/17  6:47 PM  Result Value Ref Range   TSH 1.657 0.350 - 4.500 uIU/mL    Comment: Performed by a 3rd Generation assay with a functional sensitivity of <=0.01 uIU/mL.  Comprehensive metabolic panel     Status: Abnormal   Collection Time: 11/06/17  3:34 AM  Result Value Ref Range   Sodium 138 135 - 145 mmol/L   Potassium 3.8 3.5 - 5.1 mmol/L   Chloride 112 (H) 101 - 111 mmol/L   CO2 16 (L) 22 - 32 mmol/L   Glucose, Bld 83 65 - 99 mg/dL   BUN 49 (H) 6 - 20 mg/dL   Creatinine, Ser 3.98 (H) 0.44 - 1.00 mg/dL   Calcium 9.6 8.9 - 10.3 mg/dL   Total Protein 6.0 (L) 6.5 - 8.1 g/dL   Albumin 3.4 (L) 3.5 - 5.0 g/dL   AST 14 (L) 15 - 41 U/L   ALT 11 (L) 14 - 54 U/L   Alkaline Phosphatase 58 38 - 126 U/L   Total Bilirubin 0.6 0.3 - 1.2 mg/dL   GFR calc non Af Amer 9 (L) >60 mL/min   GFR calc Af Amer 11 (L) >60 mL/min    Comment: (NOTE) The eGFR has been calculated using the CKD EPI equation. This calculation has not been validated in all clinical situations. eGFR's persistently <60 mL/min signify possible Chronic Kidney Disease.    Anion gap 10 5 - 15  CBC     Status: Abnormal   Collection Time: 11/06/17  3:34 AM  Result Value Ref Range   WBC 6.3 4.0 - 10.5 K/uL   RBC 3.33 (L) 3.87 - 5.11 MIL/uL   Hemoglobin 9.7 (L) 12.0 - 15.0 g/dL   HCT 29.4 (L) 36.0 - 46.0 %   MCV 88.3 78.0 - 100.0 fL   MCH 29.1 26.0 - 34.0  pg   MCHC 33.0 30.0 - 36.0 g/dL   RDW 13.1 11.5 - 15.5 %   Platelets 239 150 - 400 K/uL  Urinalysis, Routine w reflex microscopic     Status: Abnormal   Collection Time: 11/06/17 11:54 AM  Result Value  Ref Range   Color, Urine STRAW (A) YELLOW   APPearance CLEAR CLEAR   Specific Gravity, Urine 1.009 1.005 - 1.030   pH 5.0 5.0 - 8.0   Glucose, UA NEGATIVE NEGATIVE mg/dL   Hgb urine dipstick NEGATIVE NEGATIVE   Bilirubin Urine NEGATIVE NEGATIVE   Ketones, ur NEGATIVE NEGATIVE mg/dL   Protein, ur 100 (A) NEGATIVE mg/dL   Nitrite NEGATIVE NEGATIVE   Leukocytes, UA NEGATIVE NEGATIVE   RBC / HPF 0-5 0 - 5 RBC/hpf   WBC, UA 0-5 0 - 5 WBC/hpf   Bacteria, UA NONE SEEN NONE SEEN   Squamous Epithelial / LPF 0-5 (A) NONE SEEN  Basic metabolic panel     Status: Abnormal   Collection Time: 11/07/17  9:26 AM  Result Value Ref Range   Sodium 137 135 - 145 mmol/L   Potassium 3.1 (L) 3.5 - 5.1 mmol/L   Chloride 110 101 - 111 mmol/L   CO2 16 (L) 22 - 32 mmol/L   Glucose, Bld 113 (H) 65 - 99 mg/dL   BUN 53 (H) 6 - 20 mg/dL   Creatinine, Ser 4.40 (H) 0.44 - 1.00 mg/dL   Calcium 9.7 8.9 - 10.3 mg/dL   GFR calc non Af Amer 8 (L) >60 mL/min   GFR calc Af Amer 10 (L) >60 mL/min    Comment: (NOTE) The eGFR has been calculated using the CKD EPI equation. This calculation has not been validated in all clinical situations. eGFR's persistently <60 mL/min signify possible Chronic Kidney Disease.    Anion gap 11 5 - 15   No results found.  Assessment:  1 CKD V 2 Hypertension, uncontrolled 3 Hx of RAS and PTA; no role for intervention given her age and advanced CKD and potential harm Plan: 1 I agree with resumption of home meds 2 ESRD education  Estanislado Emms 11/07/2017, 5:12 PM

## 2017-11-08 DIAGNOSIS — I2510 Atherosclerotic heart disease of native coronary artery without angina pectoris: Secondary | ICD-10-CM

## 2017-11-08 DIAGNOSIS — I160 Hypertensive urgency: Secondary | ICD-10-CM

## 2017-11-08 DIAGNOSIS — I5032 Chronic diastolic (congestive) heart failure: Secondary | ICD-10-CM

## 2017-11-08 DIAGNOSIS — D509 Iron deficiency anemia, unspecified: Secondary | ICD-10-CM

## 2017-11-08 DIAGNOSIS — I10 Essential (primary) hypertension: Secondary | ICD-10-CM

## 2017-11-08 DIAGNOSIS — N179 Acute kidney failure, unspecified: Secondary | ICD-10-CM

## 2017-11-08 DIAGNOSIS — G40909 Epilepsy, unspecified, not intractable, without status epilepticus: Secondary | ICD-10-CM

## 2017-11-08 LAB — CBC
HEMATOCRIT: 26.7 % — AB (ref 36.0–46.0)
Hemoglobin: 9 g/dL — ABNORMAL LOW (ref 12.0–15.0)
MCH: 29.4 pg (ref 26.0–34.0)
MCHC: 33.7 g/dL (ref 30.0–36.0)
MCV: 87.3 fL (ref 78.0–100.0)
Platelets: 197 10*3/uL (ref 150–400)
RBC: 3.06 MIL/uL — ABNORMAL LOW (ref 3.87–5.11)
RDW: 13 % (ref 11.5–15.5)
WBC: 6 10*3/uL (ref 4.0–10.5)

## 2017-11-08 LAB — BASIC METABOLIC PANEL
ANION GAP: 11 (ref 5–15)
BUN: 55 mg/dL — ABNORMAL HIGH (ref 6–20)
CALCIUM: 9.1 mg/dL (ref 8.9–10.3)
CO2: 15 mmol/L — AB (ref 22–32)
Chloride: 112 mmol/L — ABNORMAL HIGH (ref 101–111)
Creatinine, Ser: 4.41 mg/dL — ABNORMAL HIGH (ref 0.44–1.00)
GFR calc Af Amer: 10 mL/min — ABNORMAL LOW (ref >60)
GFR calc non Af Amer: 8 mL/min — ABNORMAL LOW (ref >60)
GLUCOSE: 79 mg/dL (ref 65–99)
Potassium: 3.3 mmol/L — ABNORMAL LOW (ref 3.5–5.1)
Sodium: 138 mmol/L (ref 135–145)

## 2017-11-08 MED ORDER — METOPROLOL TARTRATE 25 MG PO TABS
25.0000 mg | ORAL_TABLET | Freq: Two times a day (BID) | ORAL | Status: DC
  Administered 2017-11-08 (×2): 25 mg via ORAL
  Filled 2017-11-08 (×2): qty 1

## 2017-11-08 NOTE — Progress Notes (Signed)
PROGRESS NOTE    [name]  YBO:[id number] DOB: [birth year] DOA: 11/05/2017 PCP: Lucianne Lei, MD   Brief Narrative: [name] is a 83 y.o. femalewho presents with Hypertensive emergency secondary to missed doses of medicaitons and poor response to therapies in ED w/ concurrent AKI.   Assessment & Plan:   Active Problems:   Essential hypertension   Seizure disorder (HCC)   Anemia, iron deficiency   Coronary artery disease involving native coronary artery of native heart without angina pectoris   Chronic diastolic CHF (congestive heart failure) ([place])   Hypertensive urgency   Hypertensive emergency Patient with underlying renal artery stenosis and was non-adherent with medication secondary to not having medication. Blood pressure still uncontrolled. -continue Hydralazine 75mg  TID -continue amlodipine -Nephrology recommendations: added metoprolol  CKD stage 4 -Nephrology recommendations  CAD Chronic diastolic heart failure EF of 60-65 with grade 1 diastolic dysfunction -continue Plavix  Seizure disorder -Continue Keppra  Anemia of chronic disease -Nephrology recommendations  Hypokalemia -Continue Kdur   DVT prophylaxis: Heparin Code Status: Full code Family Communication: None at bedside Disposition Plan: Discharge likely in 24 hours if blood pressure controlled.   Consultants:   Nephrology  Procedures:   None  Antimicrobials:  None    Subjective: No chest pain, headaches, dyspnea.  Objective: Vitals:   11/07/17 2054 11/08/17 0457 11/08/17 1011 11/08/17 1104  BP: (!) 185/54 (!) 179/61 (!) 192/62 (!) 171/63  Pulse: 72 67 63 68  Resp: 19 18  18   Temp: 98.4 F (36.9 C) 98 F (36.7 C)  97.8 F (36.6 C)  TempSrc: Oral Oral  Oral  SpO2: 100% 99%  100%  Weight:  64.2 kg (141 lb 8.6 oz)    Height:        Intake/Output Summary (Last 24 hours) at 11/08/2017 1433 Last data filed at 11/08/2017 1353 Gross per 24 hour  Intake 720 ml    Output 1450 ml  Net -730 ml   Filed Weights   11/06/17 0540 11/07/17 0641 11/08/17 0457  Weight: 64 kg (141 lb 1.6 oz) 64 kg (141 lb 3.2 oz) 64.2 kg (141 lb 8.6 oz)    Examination:  General exam: Appears calm and comfortable Respiratory system: Clear to auscultation. Respiratory effort normal. Cardiovascular system: S1 & S2 heard, RRR. 2/6 systolic murmurs Gastrointestinal system: Abdomen is nondistended, soft and nontender. No organomegaly or masses felt. Normal bowel sounds heard. Central nervous system: Alert and oriented. No focal neurological deficits. Extremities: No edema. No calf tenderness Skin: No cyanosis. No rashes Psychiatry: Judgement and insight appear normal. Mood & affect appropriate.     Data Reviewed: I have personally reviewed following labs and imaging studies  CBC: Recent Labs  Lab 11/05/17 1238 11/06/17 0334 11/08/17 0358  WBC 6.1 6.3 6.0  NEUTROABS 4.1  --   --   HGB 10.4* 9.7* 9.0*  HCT 31.4* 29.4* 26.7*  MCV 89.0 88.3 87.3  PLT 235 239 277   Basic Metabolic Panel: Recent Labs  Lab 11/05/17 1238 11/06/17 0334 11/07/17 0926 11/08/17 0358  NA 137 138 137 138  K 4.2 3.8 3.1* 3.3*  CL 111 112* 110 112*  CO2 17* 16* 16* 15*  GLUCOSE 90 83 113* 79  BUN 52* 49* 53* 55*  CREATININE 4.07* 3.98* 4.40* 4.41*  CALCIUM 10.2 9.6 9.7 9.1   GFR: Estimated Creatinine Clearance: 7.6 mL/min (A) (by C-G formula based on SCr of 4.41 mg/dL (H)). Liver Function Tests: Recent Labs  Lab 11/05/17 1238 11/06/17 0334  AST 16 14*  ALT 12* 11*  ALKPHOS 60 58  BILITOT 0.8 0.6  PROT 6.7 6.0*  ALBUMIN 3.9 3.4*   No results for input(s): LIPASE, AMYLASE in the last 168 hours. No results for input(s): AMMONIA in the last 168 hours. Coagulation Profile: No results for input(s): INR, PROTIME in the last 168 hours. Cardiac Enzymes: No results for input(s): CKTOTAL, CKMB, CKMBINDEX, TROPONINI in the last 168 hours. BNP (last 3 results) No results for  input(s): PROBNP in the last 8760 hours. HbA1C: No results for input(s): HGBA1C in the last 72 hours. CBG: No results for input(s): GLUCAP in the last 168 hours. Lipid Profile: No results for input(s): CHOL, HDL, LDLCALC, TRIG, CHOLHDL, LDLDIRECT in the last 72 hours. Thyroid Function Tests: Recent Labs    11/05/17 1847  TSH 1.657   Anemia Panel: No results for input(s): VITAMINB12, FOLATE, FERRITIN, TIBC, IRON, RETICCTPCT in the last 72 hours. Sepsis Labs: No results for input(s): PROCALCITON, LATICACIDVEN in the last 168 hours.  No results found for this or any previous visit (from the past 240 hour(s)).       Radiology Studies: No results found.      Scheduled Meds: . amLODipine  10 mg Oral Daily  . clopidogrel  75 mg Oral Daily  . doxazosin  8 mg Oral Daily  . heparin  5,000 Units Subcutaneous Q8H  . hydrALAZINE  75 mg Oral Q8H  . levETIRAcetam  500 mg Oral BID  . metoprolol tartrate  25 mg Oral BID  . potassium chloride  10 mEq Oral BID   Continuous Infusions:   LOS: 2 days     Cordelia Poche, MD [facility] 11/08/2017, 2:33 PM Pager: [phone]  If 7PM-7AM, please contact night-coverage [email or web address] Password [facility] 11/08/2017, 2:33 PM

## 2017-11-08 NOTE — Progress Notes (Signed)
Assessment:  1 CKD V 2 Hypertension, uncontrolled 3 Hx of RAS and PTA; no role for intervention given her age and advanced CKD and potential harm 4 Anemia Plan: Will add metoprolol and hopefully HR will tolerate  Subjective: Interval History: BP still up  Objective: Vital signs in last 24 hours: Temp:  [97.8 F (36.6 C)-98.4 F (36.9 C)] 97.8 F (36.6 C) (11/29 1104) Pulse Rate:  [63-72] 68 (11/29 1104) Resp:  [18-19] 18 (11/29 1104) BP: (171-192)/(54-63) 171/63 (11/29 1104) SpO2:  [99 %-100 %] 100 % (11/29 1104) Weight:  [64.2 kg (141 lb 8.6 oz)] 64.2 kg (141 lb 8.6 oz) (11/29 0457) Weight change: 0.152 kg (5.4 oz)  Intake/Output from previous day: 11/28 0701 - 11/29 0700 In: 600 [P.O.:600] Out: 800 [Urine:800] Intake/Output this shift: Total I/O In: 240 [P.O.:240] Out: 450 [Urine:450]  General appearance: alert and cooperative Resp: clear to auscultation bilaterally Chest wall: no tenderness Cardio: regular rate and rhythm, S1, S2 normal, no murmur, click, rub or gallop Extremities: extremities normal, atraumatic, no cyanosis or edema  Lab Results: Recent Labs    11/06/17 0334 11/08/17 0358  WBC 6.3 6.0  HGB 9.7* 9.0*  HCT 29.4* 26.7*  PLT 239 197   BMET:  Recent Labs    11/07/17 0926 11/08/17 0358  NA 137 138  K 3.1* 3.3*  CL 110 112*  CO2 16* 15*  GLUCOSE 113* 79  BUN 53* 55*  CREATININE 4.40* 4.41*  CALCIUM 9.7 9.1   No results for input(s): PTH in the last 72 hours. Iron Studies: No results for input(s): IRON, TIBC, TRANSFERRIN, FERRITIN in the last 72 hours. Studies/Results: No results found.  Scheduled: . amLODipine  10 mg Oral Daily  . clopidogrel  75 mg Oral Daily  . doxazosin  8 mg Oral Daily  . heparin  5,000 Units Subcutaneous Q8H  . hydrALAZINE  75 mg Oral Q8H  . levETIRAcetam  500 mg Oral BID  . metoprolol tartrate  25 mg Oral BID  . potassium chloride  10 mEq Oral BID     LOS: 2 days   Estanislado Emms 11/08/2017,1:21  PM

## 2017-11-09 ENCOUNTER — Encounter (HOSPITAL_COMMUNITY): Payer: Self-pay | Admitting: Student

## 2017-11-09 LAB — BASIC METABOLIC PANEL
ANION GAP: 9 (ref 5–15)
BUN: 52 mg/dL — ABNORMAL HIGH (ref 6–20)
CALCIUM: 8.8 mg/dL — AB (ref 8.9–10.3)
CO2: 16 mmol/L — AB (ref 22–32)
CREATININE: 4.32 mg/dL — AB (ref 0.44–1.00)
Chloride: 110 mmol/L (ref 101–111)
GFR, EST AFRICAN AMERICAN: 10 mL/min — AB (ref >60)
GFR, EST NON AFRICAN AMERICAN: 9 mL/min — AB (ref >60)
Glucose, Bld: 74 mg/dL (ref 65–99)
Potassium: 3.4 mmol/L — ABNORMAL LOW (ref 3.5–5.1)
Sodium: 135 mmol/L (ref 135–145)

## 2017-11-09 MED ORDER — POTASSIUM CHLORIDE CRYS ER 20 MEQ PO TBCR
40.0000 meq | EXTENDED_RELEASE_TABLET | Freq: Once | ORAL | Status: AC
  Administered 2017-11-09: 40 meq via ORAL
  Filled 2017-11-09: qty 2

## 2017-11-09 MED ORDER — CLONIDINE HCL 0.1 MG PO TABS: 0.1000 mg | ORAL_TABLET | Freq: Every day | ORAL | Status: DC

## 2017-11-09 MED ORDER — HYDRALAZINE HCL 50 MG PO TABS
50.0000 mg | ORAL_TABLET | Freq: Three times a day (TID) | ORAL | Status: DC
  Administered 2017-11-09 – 2017-11-10 (×3): 50 mg via ORAL
  Filled 2017-11-09 (×3): qty 1

## 2017-11-09 MED ORDER — CLONIDINE HCL 0.1 MG PO TABS
0.1000 mg | ORAL_TABLET | Freq: Two times a day (BID) | ORAL | Status: DC
  Administered 2017-11-09: 0.1 mg via ORAL
  Filled 2017-11-09: qty 1

## 2017-11-09 NOTE — Progress Notes (Signed)
Assessment:  1CKD V 2Hypertension, looks better (HR 51-68) 3 Hx of RAS and PTA; no role for intervention given her age and advanced CKD and potential harm 4 Anemia Plan: We will discuss dialysis options further as OP F/U BMET, on K?   Subjective: Interval History: BP better, did not see educational videos  Objective: Vital signs in last 24 hours: Temp:  [98.4 F (36.9 C)-99 F (37.2 C)] 99 F (37.2 C) (11/30 0733) Pulse Rate:  [51-68] 51 (11/30 0733) Resp:  [16-20] 18 (11/30 0733) BP: (146-176)/(40-62) 146/55 (11/30 0733) SpO2:  [98 %-100 %] 99 % (11/30 0733) Weight:  [64.8 kg (142 lb 14.4 oz)] 64.8 kg (142 lb 14.4 oz) (11/30 0513) Weight change: 0.619 kg (1 lb 5.8 oz)  Intake/Output from previous day: 11/29 0701 - 11/30 0700 In: 480 [P.O.:480] Out: 1350 [Urine:1350] Intake/Output this shift: No intake/output data recorded.  General appearance: alert and cooperative Resp: clear to auscultation bilaterally Cardio: regular rate and rhythm, S1, S2 normal, no murmur, click, rub or gallop Extremities: extremities normal, atraumatic, no cyanosis or edema  Lab Results: Recent Labs    11/08/17 0358  WBC 6.0  HGB 9.0*  HCT 26.7*  PLT 197   BMET:  Recent Labs    11/07/17 0926 11/08/17 0358  NA 137 138  K 3.1* 3.3*  CL 110 112*  CO2 16* 15*  GLUCOSE 113* 79  BUN 53* 55*  CREATININE 4.40* 4.41*  CALCIUM 9.7 9.1   No results for input(s): PTH in the last 72 hours. Iron Studies: No results for input(s): IRON, TIBC, TRANSFERRIN, FERRITIN in the last 72 hours. Studies/Results: No results found.  Scheduled: . amLODipine  10 mg Oral Daily  . cloNIDine  0.1 mg Oral BID  . clopidogrel  75 mg Oral Daily  . doxazosin  8 mg Oral Daily  . heparin  5,000 Units Subcutaneous Q8H  . hydrALAZINE  75 mg Oral Q8H  . levETIRAcetam  500 mg Oral BID  . potassium chloride  10 mEq Oral BID     LOS: 3 days   Estanislado Emms 11/09/2017,11:19 AM

## 2017-11-09 NOTE — Progress Notes (Signed)
Patient is active with [facility] as prior to admission for [facility] and PT; Aneta Mins [phone]

## 2017-11-09 NOTE — Progress Notes (Signed)
MD called and stated to ambulate pt  Pt stated she does not want to ambulate at this time, pt states she will try after lunch  Will continue to monitor

## 2017-11-09 NOTE — Care Management Important Message (Signed)
Important Message  Patient Details  Name: [name] MRN: [id number] Date of Birth: [birth year]   Medicare Important Message Given:  Yes    Taneya Conkel 11/09/2017, 12:03 PM

## 2017-11-09 NOTE — Progress Notes (Signed)
PROGRESS NOTE    [name]  BOF:[id number] DOB: 31-Dec-1929 DOA: 11/05/2017 PCP: Lucianne Lei, MD   Brief Narrative: [name] is a 83 y.o. femalewho presents with Hypertensive emergency secondary to missed doses of medicaitons and poor response to therapies in ED w/ concurrent AKI.   Assessment & Plan:   Active Problems:   Essential hypertension   Seizure disorder (HCC)   Anemia, iron deficiency   Coronary artery disease involving native coronary artery of native heart without angina pectoris   Chronic diastolic CHF (congestive heart failure) ([place])   Hypertensive urgency   Hypertensive emergency Patient with underlying renal artery stenosis and was non-adherent with medication secondary to not having medication. Blood pressure too controlled. Patient orthostatic. -decrease to Hydralazine 50 mg TID -continue amlodipine -start clonidine 0.1 mg  CKD stage 4 -Nephrology recommendations  CAD Chronic diastolic heart failure EF of 60-65 with grade 1 diastolic dysfunction -continue Plavix  Seizure disorder -Continue Keppra  Anemia of chronic disease -Nephrology recommendations  Hypokalemia -Continue Kdur   DVT prophylaxis: Heparin Code Status: Full code Family Communication: None at bedside Disposition Plan: Discharge likely in 24 hours if blood pressure controlled.   Consultants:   Nephrology  Procedures:   None  Antimicrobials:  None    Subjective: No chest pain, headaches, dyspnea.  Objective: Vitals:   11/09/17 1521 11/09/17 1523 11/09/17 1525 11/09/17 1528  BP: (!) 134/49 (!) 128/48 (!) 110/47 (!) 122/52  Pulse: (!) 54 (!) 53 (!) 59 60  Resp: 18 18 19 19   Temp:      TempSrc:      SpO2: 100% 100% 100% 100%  Weight:      Height:        Intake/Output Summary (Last 24 hours) at 11/09/2017 1613 Last data filed at 11/09/2017 1100 Gross per 24 hour  Intake -  Output 900 ml  Net -900 ml   Filed Weights   11/07/17 0641 11/08/17  0457 11/09/17 0513  Weight: 64 kg (141 lb 3.2 oz) 64.2 kg (141 lb 8.6 oz) 64.8 kg (142 lb 14.4 oz)    Examination:  General exam: Appears calm and comfortable Respiratory system: Clear to auscultation. Respiratory effort normal. Cardiovascular system: S1 & S2 heard, RRR. 2/6 systolic murmurs Gastrointestinal system: Abdomen is nondistended, soft and nontender. No organomegaly or masses felt. Normal bowel sounds heard. Central nervous system: Alert and oriented. No focal neurological deficits. Extremities: No edema. No calf tenderness Skin: No cyanosis. No rashes Psychiatry: Judgement and insight appear normal. Mood & affect appropriate.     Data Reviewed: I have personally reviewed following labs and imaging studies  CBC: Recent Labs  Lab 11/05/17 1238 11/06/17 0334 11/08/17 0358  WBC 6.1 6.3 6.0  NEUTROABS 4.1  --   --   HGB 10.4* 9.7* 9.0*  HCT 31.4* 29.4* 26.7*  MCV 89.0 88.3 87.3  PLT 235 239 778   Basic Metabolic Panel: Recent Labs  Lab 11/05/17 1238 11/06/17 0334 11/07/17 0926 11/08/17 0358 11/09/17 1130  NA 137 138 137 138 135  K 4.2 3.8 3.1* 3.3* 3.4*  CL 111 112* 110 112* 110  CO2 17* 16* 16* 15* 16*  GLUCOSE 90 83 113* 79 74  BUN 52* 49* 53* 55* 52*  CREATININE 4.07* 3.98* 4.40* 4.41* 4.32*  CALCIUM 10.2 9.6 9.7 9.1 8.8*   GFR: Estimated Creatinine Clearance: 7.8 mL/min (A) (by C-G formula based on SCr of 4.32 mg/dL (H)). Liver Function Tests: Recent Labs  Lab 11/05/17 1238 11/06/17 0334  AST 16 14*  ALT 12* 11*  ALKPHOS 60 58  BILITOT 0.8 0.6  PROT 6.7 6.0*  ALBUMIN 3.9 3.4*   No results for input(s): LIPASE, AMYLASE in the last 168 hours. No results for input(s): AMMONIA in the last 168 hours. Coagulation Profile: No results for input(s): INR, PROTIME in the last 168 hours. Cardiac Enzymes: No results for input(s): CKTOTAL, CKMB, CKMBINDEX, TROPONINI in the last 168 hours. BNP (last 3 results) No results for input(s): PROBNP in the  last 8760 hours. HbA1C: No results for input(s): HGBA1C in the last 72 hours. CBG: No results for input(s): GLUCAP in the last 168 hours. Lipid Profile: No results for input(s): CHOL, HDL, LDLCALC, TRIG, CHOLHDL, LDLDIRECT in the last 72 hours. Thyroid Function Tests: No results for input(s): TSH, T4TOTAL, FREET4, T3FREE, THYROIDAB in the last 72 hours. Anemia Panel: No results for input(s): VITAMINB12, FOLATE, FERRITIN, TIBC, IRON, RETICCTPCT in the last 72 hours. Sepsis Labs: No results for input(s): PROCALCITON, LATICACIDVEN in the last 168 hours.  No results found for this or any previous visit (from the past 240 hour(s)).       Radiology Studies: No results found.      Scheduled Meds: . amLODipine  10 mg Oral Daily  . [START ON 11/10/2017] cloNIDine  0.1 mg Oral Daily  . clopidogrel  75 mg Oral Daily  . doxazosin  8 mg Oral Daily  . heparin  5,000 Units Subcutaneous Q8H  . hydrALAZINE  50 mg Oral Q8H  . levETIRAcetam  500 mg Oral BID   Continuous Infusions:   LOS: 3 days     Cordelia Poche, MD [facility] 11/09/2017, 4:13 PM Pager: [phone]  If 7PM-7AM, please contact night-coverage [email or web address] Password [email or web address] 11/09/2017, 4:13 PM

## 2017-11-10 MED ORDER — HYDRALAZINE HCL 50 MG PO TABS: 50.0000 mg | ORAL_TABLET | Freq: Three times a day (TID) | ORAL | 0 refills | Status: DC

## 2017-11-10 MED ORDER — CLONIDINE HCL 0.1 MG PO TABS: 0.1000 mg | ORAL_TABLET | Freq: Two times a day (BID) | ORAL | 0 refills | Status: DC

## 2017-11-10 MED ORDER — CLONIDINE HCL 0.1 MG PO TABS
0.1000 mg | ORAL_TABLET | Freq: Two times a day (BID) | ORAL | Status: DC
  Administered 2017-11-10: 0.1 mg via ORAL
  Filled 2017-11-10: qty 1

## 2017-11-10 MED ORDER — AMLODIPINE BESYLATE 10 MG PO TABS: ORAL_TABLET | ORAL | 11 refills | Status: DC

## 2017-11-10 MED ORDER — POTASSIUM CHLORIDE ER 10 MEQ PO TBCR: 20.0000 meq | EXTENDED_RELEASE_TABLET | Freq: Every day | ORAL | 0 refills | Status: DC

## 2017-11-10 MED ORDER — DOXAZOSIN MESYLATE 8 MG PO TABS: ORAL_TABLET | ORAL | 0 refills | Status: DC

## 2017-11-10 NOTE — Discharge Summary (Signed)
Pt got discharged to home, discharge instructions provided and patient showed understanding to it, IV taken out,Telemonitor DC,pt left unit in wheelchair with all of the belongings accompanied with a family member (daughter) 

## 2017-11-10 NOTE — Progress Notes (Signed)
Patient walked in a hallway without having difficulty in breathing, post ambulation vitals stable and recorded, pt is expected to go home at some point today, no any complain of CP and SOB, will continue to monitor

## 2017-11-10 NOTE — Discharge Instructions (Signed)
[name],  You were admitted for high blood pressure. Your medications have been changed. Please follow-up at your primary care physician's office.

## 2017-11-10 NOTE — Progress Notes (Signed)
Assessment:  1CKD V 2Hypertension, looks better (HR 47-59) 3 Hx of RAS and PTA; no role for intervention given her age and advanced CKD and potential harm 4 Anemia Plan: We will discuss dialysis options further as OP, education before discharge   Subjective: Interval History: feels ok, reports ambulation  Objective: Vital signs in last 24 hours: Temp:  [97 F (36.1 C)-98 F (36.7 C)] 97.9 F (36.6 C) (12/01 1200) Pulse Rate:  [47-60] 47 (12/01 1200) Resp:  [18-19] 18 (12/01 1200) BP: (110-160)/(43-55) 142/49 (12/01 1200) SpO2:  [98 %-100 %] 99 % (12/01 1200) Weight:  [64.2 kg (141 lb 9.6 oz)] 64.2 kg (141 lb 9.6 oz) (12/01 0731) Weight change:   Intake/Output from previous day: 11/30 0701 - 12/01 0700 In: 1040 [P.O.:1040] Out: 1175 [Urine:1175] Intake/Output this shift: Total I/O In: 600 [P.O.:600] Out: 600 [Urine:600]  General appearance: alert and cooperative Resp: clear to auscultation bilaterally Cardio: regular rate and rhythm, S1, S2 normal, no murmur, click, rub or gallop Extremities: extremities normal, atraumatic, no cyanosis or edema  Lab Results: Recent Labs    11/08/17 0358  WBC 6.0  HGB 9.0*  HCT 26.7*  PLT 197   BMET:  Recent Labs    11/08/17 0358 11/09/17 1130  NA 138 135  K 3.3* 3.4*  CL 112* 110  CO2 15* 16*  GLUCOSE 79 74  BUN 55* 52*  CREATININE 4.41* 4.32*  CALCIUM 9.1 8.8*   No results for input(s): PTH in the last 72 hours. Iron Studies: No results for input(s): IRON, TIBC, TRANSFERRIN, FERRITIN in the last 72 hours. Studies/Results: No results found.  Scheduled: . amLODipine  10 mg Oral Daily  . cloNIDine  0.1 mg Oral BID  . clopidogrel  75 mg Oral Daily  . doxazosin  8 mg Oral Daily  . heparin  5,000 Units Subcutaneous Q8H  . hydrALAZINE  50 mg Oral Q8H  . levETIRAcetam  500 mg Oral BID    LOS: 4 days   Estanislado Emms 11/10/2017,2:38 PM

## 2017-11-10 NOTE — Discharge Summary (Signed)
Physician Discharge Summary  [name] NID:[id number] DOB: [birth year] DOA: 11/05/2017  PCP: Lucianne Lei, MD  Admit date: 11/05/2017 Discharge date: 11/10/2017  Admitted From: Home Disposition: Home  Recommendations for Outpatient Follow-up:  1. Follow up with PCP in 1 week 2. Blood pressure regimen: Hydralazine 50 mg TID, Amlodipine 10 mg daily, Clonidine 0.1 mg BID 3. Follow up with nephrology 4. Please obtain BMP/CBC in one week 5. Please follow up on the following pending results: None  Home Health: None Equipment/Devices: None  Discharge Condition: Stable CODE STATUS: Full code Diet recommendation: Heart healthy/renal   Brief/Interim Summary:  Admission HPI written by Sharene Butters, PA-C  Chief Complaint: Elevated blood pressure  HPI: [name] is a 83 y.o. female with medical history significant for CAD, hypertension, renal artery stenosis, GERD, CKD, iron deficiency anemia, history of seizures, brought by EMS, after her home nurse came and noticed that her blood pressure was very elevated in the 240s.  At the time of presentation, she did report that she has been out of amlodipine for the last 2 days, although she has been compliant with her other medications including hydralazine.  She denies any headaches, or vision changes, she denies any significant dizziness, vertigo, syncope or presyncope.  She denies any chest pain or palpitations.  She denies any headache or vision changes.  She denies any difficulty walking, focal numbness or weakness.  She denies any lower extremity edema.  She is compliant with her medications, and with her doctors visits.  Of note, the patient has been told by her nephrologist Dr. Florene Glen that her renal functions are worsened lately.  His any food indiscretion.  She denies any poor liquid intake   ED Course:  BP (!) 174/58   Pulse 63   Resp 19   SpO2 100%   Hydralazine 10 mg IV x1, and Norvasc 10 mg Blood pressure remained in the  144R-154M systolic. Creatinine is 4, baseline was at 3 until recently.    Hospital course:  Hypertensive emergency Patient with underlying renal artery stenosis and was non-adherent with medication secondary to not having medication. Blood pressure medications adjusted and patient was orthostatic on hydralazine 75 mg, amlodipine 10 mg and clonidine 0.1 mg BID. Hydralazine decreased to home dose of 50 mg. Patient not orthostatic and ambulated without symptoms.  CKD stage 4 Nephrology discussed possible dialysis with patient. Outpatient follow up.  CAD Chronic diastolic heart failure EF of 60-65 with grade 1 diastolic dysfunction. Continued Plavix.  Seizure disorder Continued Keppra  Anemia of chronic disease Secondary to CKD. Nephrology managing.  Hypokalemia -Continue Kdur  Discharge Diagnoses:  Active Problems:   Essential hypertension   Seizure disorder (HCC)   Anemia, iron deficiency   Coronary artery disease involving native coronary artery of native heart without angina pectoris   Chronic diastolic CHF (congestive heart failure) ([place])   Hypertensive urgency    Discharge Instructions  Discharge Instructions    Call MD for:  difficulty breathing, headache or visual disturbances   Complete by:  As directed    Call MD for:  extreme fatigue   Complete by:  As directed    Call MD for:  persistant dizziness or light-headedness   Complete by:  As directed    Call MD for:  persistant nausea and vomiting   Complete by:  As directed    Diet - low sodium heart healthy   Complete by:  As directed    Increase activity slowly   Complete by:  As directed      Allergies as of 11/10/2017      Reactions   Haldol [haloperidol Decanoate] Other (See Comments)   Agitation, aggressive and combative   Lorazepam Other (See Comments)   Agitation, lethargic   Cephalexin Other (See Comments)   REACTION: Questionable, no reaction listed      Medication List    STOP taking  these medications   furosemide 40 MG tablet Commonly known as:  LASIX     TAKE these medications   amLODipine 10 MG tablet Commonly known as:  NORVASC TAKE 1 TABLET(10 MG) BY MOUTH DAILY   cloNIDine 0.1 MG tablet Commonly known as:  CATAPRES Take 1 tablet (0.1 mg total) by mouth 2 (two) times daily.   clopidogrel 75 MG tablet Commonly known as:  PLAVIX Take 75 mg by mouth daily.   doxazosin 8 MG tablet Commonly known as:  CARDURA TAKE 1 TABLET(8 MG) BY MOUTH DAILY   hydrALAZINE 50 MG tablet Commonly known as:  APRESOLINE Take 1 tablet (50 mg total) by mouth 3 (three) times daily.   levETIRAcetam 500 MG tablet Commonly known as:  KEPPRA Take 500 mg by mouth 2 (two) times daily.   potassium chloride 10 MEQ tablet Commonly known as:  K-DUR Take 2 tablets (20 mEq total) by mouth daily for 3 days. What changed:  See the new instructions.      Follow-up Information    [facility].   Specialty:  [place] Why:  They will continue to do your home health care at your home Contact information: [place] [address] [phone]        Lucianne Lei, MD. Schedule an appointment as soon as possible for a visit in 1 week(s).   Specialty:  Family Medicine Contact information: [address] [place] [address] [phone]        Estanislado Emms, MD. Schedule an appointment as soon as possible for a visit.   Specialty:  Nephrology Contact information: [address]                          [place] [address] [phone]          Allergies  Allergen Reactions  . Haldol [Haloperidol Decanoate] Other (See Comments)    Agitation, aggressive and combative  . Lorazepam Other (See Comments)    Agitation, lethargic  . Cephalexin Other (See Comments)    REACTION: Questionable, no reaction listed    Consultations:  Nephrology   Procedures/Studies:  No results found.    Subjective: No chest  pain or dyspnea.  Discharge Exam: Vitals:   11/10/17 1100 11/10/17 1200  BP: (!) 150/43 (!) 142/49  Pulse: (!) 50 (!) 47  Resp: 18 18  Temp: (!) 97 F (36.1 C) 97.9 F (36.6 C)  SpO2: 98% 99%   Vitals:   11/09/17 1955 11/10/17 0731 11/10/17 1100 11/10/17 1200  BP: (!) 160/55 (!) 155/55 (!) 150/43 (!) 142/49  Pulse: (!) 53 (!) 54 (!) 50 (!) 47  Resp: 18  18 18   Temp: 98 F (36.7 C) 97.8 F (36.6 C) (!) 97 F (36.1 C) 97.9 F (36.6 C)  TempSrc: Oral Oral Oral Oral  SpO2: 99% 98% 98% 99%  Weight:  64.2 kg (141 lb 9.6 oz)    Height:        General: Pt is alert, awake, not in acute distress  Cardiovascular: RRR, S1/S2 +, no rubs, no gallops, 2/6 systolic murmur Respiratory: CTA bilaterally, no wheezing, no rhonchi Abdominal: Soft, NT, ND, bowel sounds + Extremities: no edema, no cyanosis    The results of significant diagnostics from this hospitalization (including imaging, microbiology, ancillary and laboratory) are listed below for reference.      Labs: Basic Metabolic Panel: Recent Labs  Lab 11/05/17 1238 11/06/17 0334 11/07/17 0926 11/08/17 0358 11/09/17 1130  NA 137 138 137 138 135  K 4.2 3.8 3.1* 3.3* 3.4*  CL 111 112* 110 112* 110  CO2 17* 16* 16* 15* 16*  GLUCOSE 90 83 113* 79 74  BUN 52* 49* 53* 55* 52*  CREATININE 4.07* 3.98* 4.40* 4.41* 4.32*  CALCIUM 10.2 9.6 9.7 9.1 8.8*   Liver Function Tests: Recent Labs  Lab 11/05/17 1238 11/06/17 0334  AST 16 14*  ALT 12* 11*  ALKPHOS 60 58  BILITOT 0.8 0.6  PROT 6.7 6.0*  ALBUMIN 3.9 3.4*   CBC: Recent Labs  Lab 11/05/17 1238 11/06/17 0334 11/08/17 0358  WBC 6.1 6.3 6.0  NEUTROABS 4.1  --   --   HGB 10.4* 9.7* 9.0*  HCT 31.4* 29.4* 26.7*  MCV 89.0 88.3 87.3  PLT 235 239 197   Urinalysis    Component Value Date/Time   COLORURINE STRAW (A) 11/06/2017 1154   APPEARANCEUR CLEAR 11/06/2017 1154   LABSPEC 1.009 11/06/2017 1154   PHURINE 5.0 11/06/2017 1154   GLUCOSEU NEGATIVE 11/06/2017  1154   HGBUR NEGATIVE 11/06/2017 1154   BILIRUBINUR NEGATIVE 11/06/2017 1154   KETONESUR NEGATIVE 11/06/2017 1154   PROTEINUR 100 (A) 11/06/2017 1154   UROBILINOGEN 0.2 08/18/2013 1049   NITRITE NEGATIVE 11/06/2017 1154   LEUKOCYTESUR NEGATIVE 11/06/2017 1154     SIGNED:   Cordelia Poche, MD [facility] 11/10/2017, 3:05 PM Pager [phone]  If 7PM-7AM, please contact night-coverage [email or web address] Password [email or web address]

## 2017-11-10 NOTE — Evaluation (Signed)
Physical Therapy Evaluation Patient Details Name: [name] MRN: [id number] DOB: [birth year] Today's Date: 11/10/2017   History of Present Illness  83 y.o. female who presents with Hypertensive emergency secondary to missed doses of medicaitons and poor response to therapies in ED w/ concurrent AKI  Clinical Impression  Orders received for PT evaluation. Patient demonstrates very modest deficits in functional mobility but overall is mobilizing well and reports at her baseline. No further acute PT needs. Will sign off.    Follow Up Recommendations No PT follow up    Equipment Recommendations  None recommended by PT    Recommendations for Other Services       Precautions / Restrictions Restrictions Weight Bearing Restrictions: No      Mobility  Bed Mobility Overal bed mobility: Independent                Transfers Overall transfer level: Modified independent Equipment used: Rolling walker (2 wheeled)                Ambulation/Gait Ambulation/Gait assistance: Modified independent (Device/Increase time) Ambulation Distance (Feet): 180 Feet Assistive device: Rolling walker (2 wheeled) Gait Pattern/deviations: Step-through pattern;Decreased stride length     General Gait Details: steady with use of RW (for comfort, ambulated in room without device) increased DOE but saturations stable 98% on room air  Stairs            Wheelchair Mobility    Modified Rankin (Stroke Patients Only)       Balance Overall balance assessment: No apparent balance deficits (not formally assessed)                                           Pertinent Vitals/Pain Pain Assessment: No/denies pain    Home Living Family/patient expects to be discharged to:: Private residence Living Arrangements: Alone Available Help at Discharge: Family Type of Home: Apartment Home Access: Level entry     Home Layout: One level Home Equipment: Environmental consultant - 2  wheels;Shower seat;Cane - single point      Prior Function Level of Independence: Independent         Comments: uses RW on occassion      Hand Dominance   Dominant Hand: Right    Extremity/Trunk Assessment   Upper Extremity Assessment Upper Extremity Assessment: Generalized weakness    Lower Extremity Assessment Lower Extremity Assessment: Generalized weakness       Communication   Communication: No difficulties  Cognition Arousal/Alertness: Awake/alert Behavior During Therapy: WFL for tasks assessed/performed Overall Cognitive Status: Within Functional Limits for tasks assessed                                        General Comments      Exercises     Assessment/Plan    PT Assessment Patent does not need any further PT services  PT Problem List         PT Treatment Interventions      PT Goals (Current goals can be found in the Care Plan section)  Acute Rehab PT Goals Patient Stated Goal: to go home PT Goal Formulation: All assessment and education complete, DC therapy    Frequency     Barriers to discharge        Co-evaluation  AM-PAC PT "6 Clicks" Daily Activity  Outcome Measure Difficulty turning over in bed (including adjusting bedclothes, sheets and blankets)?: A Little Difficulty moving from lying on back to sitting on the side of the bed? : A Little Difficulty sitting down on and standing up from a chair with arms (e.g., wheelchair, bedside commode, etc,.)?: A Little Help needed moving to and from a bed to chair (including a wheelchair)?: None Help needed walking in hospital room?: None Help needed climbing 3-5 steps with a railing? : A Little 6 Click Score: 20    End of Session Equipment Utilized During Treatment: Gait belt Activity Tolerance: Patient tolerated treatment well Patient left: in bed;with call bell/phone within reach Nurse Communication: Mobility status PT Visit Diagnosis:  Unsteadiness on feet (R26.81)    Time: 5400-8676 PT Time Calculation (min) (ACUTE ONLY): 22 min   Charges:   PT Evaluation $PT Eval Low Complexity: 1 Low     PT G Codes:        Alben Deeds, PT DPT  Board Certified Neurologic Specialist [phone]   Duncan Dull 11/10/2017, 3:38 PM

## 2017-11-12 ENCOUNTER — Other Ambulatory Visit: Payer: Self-pay | Admitting: Family Medicine

## 2017-11-12 DIAGNOSIS — Z139 Encounter for screening, unspecified: Secondary | ICD-10-CM

## 2017-11-13 DIAGNOSIS — I70209 Unspecified atherosclerosis of native arteries of extremities, unspecified extremity: Secondary | ICD-10-CM | POA: Diagnosis not present

## 2017-11-13 DIAGNOSIS — I5022 Chronic systolic (congestive) heart failure: Secondary | ICD-10-CM | POA: Diagnosis not present

## 2017-11-13 DIAGNOSIS — I130 Hypertensive heart and chronic kidney disease with heart failure and stage 1 through stage 4 chronic kidney disease, or unspecified chronic kidney disease: Secondary | ICD-10-CM | POA: Diagnosis not present

## 2017-11-13 DIAGNOSIS — N184 Chronic kidney disease, stage 4 (severe): Secondary | ICD-10-CM | POA: Diagnosis not present

## 2017-11-13 DIAGNOSIS — G40909 Epilepsy, unspecified, not intractable, without status epilepticus: Secondary | ICD-10-CM | POA: Diagnosis not present

## 2017-11-15 DIAGNOSIS — I130 Hypertensive heart and chronic kidney disease with heart failure and stage 1 through stage 4 chronic kidney disease, or unspecified chronic kidney disease: Secondary | ICD-10-CM | POA: Diagnosis not present

## 2017-11-15 DIAGNOSIS — N184 Chronic kidney disease, stage 4 (severe): Secondary | ICD-10-CM | POA: Diagnosis not present

## 2017-11-15 DIAGNOSIS — I5022 Chronic systolic (congestive) heart failure: Secondary | ICD-10-CM | POA: Diagnosis not present

## 2017-11-15 DIAGNOSIS — G40909 Epilepsy, unspecified, not intractable, without status epilepticus: Secondary | ICD-10-CM | POA: Diagnosis not present

## 2017-11-15 DIAGNOSIS — I70209 Unspecified atherosclerosis of native arteries of extremities, unspecified extremity: Secondary | ICD-10-CM | POA: Diagnosis not present

## 2017-11-23 DIAGNOSIS — I10 Essential (primary) hypertension: Secondary | ICD-10-CM | POA: Diagnosis not present

## 2017-11-23 DIAGNOSIS — N186 End stage renal disease: Secondary | ICD-10-CM | POA: Diagnosis not present

## 2017-11-23 DIAGNOSIS — I5032 Chronic diastolic (congestive) heart failure: Secondary | ICD-10-CM | POA: Diagnosis not present

## 2017-11-27 DIAGNOSIS — N185 Chronic kidney disease, stage 5: Secondary | ICD-10-CM | POA: Diagnosis not present

## 2017-11-27 DIAGNOSIS — I701 Atherosclerosis of renal artery: Secondary | ICD-10-CM | POA: Diagnosis not present

## 2017-11-27 DIAGNOSIS — N261 Atrophy of kidney (terminal): Secondary | ICD-10-CM | POA: Diagnosis not present

## 2017-11-28 DIAGNOSIS — D631 Anemia in chronic kidney disease: Secondary | ICD-10-CM | POA: Diagnosis not present

## 2017-12-03 DIAGNOSIS — N184 Chronic kidney disease, stage 4 (severe): Secondary | ICD-10-CM | POA: Diagnosis not present

## 2017-12-03 DIAGNOSIS — I5022 Chronic systolic (congestive) heart failure: Secondary | ICD-10-CM | POA: Diagnosis not present

## 2017-12-03 DIAGNOSIS — I130 Hypertensive heart and chronic kidney disease with heart failure and stage 1 through stage 4 chronic kidney disease, or unspecified chronic kidney disease: Secondary | ICD-10-CM | POA: Diagnosis not present

## 2017-12-03 DIAGNOSIS — G40909 Epilepsy, unspecified, not intractable, without status epilepticus: Secondary | ICD-10-CM | POA: Diagnosis not present

## 2017-12-03 DIAGNOSIS — I70209 Unspecified atherosclerosis of native arteries of extremities, unspecified extremity: Secondary | ICD-10-CM | POA: Diagnosis not present

## 2017-12-05 ENCOUNTER — Other Ambulatory Visit: Payer: Self-pay

## 2017-12-05 DIAGNOSIS — N179 Acute kidney failure, unspecified: Secondary | ICD-10-CM

## 2017-12-05 DIAGNOSIS — N189 Chronic kidney disease, unspecified: Principal | ICD-10-CM

## 2017-12-05 DIAGNOSIS — N185 Chronic kidney disease, stage 5: Secondary | ICD-10-CM

## 2017-12-05 DIAGNOSIS — Z01810 Encounter for preprocedural cardiovascular examination: Secondary | ICD-10-CM

## 2017-12-12 ENCOUNTER — Ambulatory Visit
Admission: RE | Admit: 2017-12-12 | Discharge: 2017-12-12 | Disposition: A | Discharge status: Home / Self Care | Payer: Medicare Other | Source: Ambulatory Visit | Attending: Family Medicine | Admitting: Family Medicine

## 2017-12-12 DIAGNOSIS — Z1231 Encounter for screening mammogram for malignant neoplasm of breast: Secondary | ICD-10-CM | POA: Diagnosis not present

## 2017-12-12 DIAGNOSIS — Z139 Encounter for screening, unspecified: Secondary | ICD-10-CM

## 2017-12-14 DIAGNOSIS — I130 Hypertensive heart and chronic kidney disease with heart failure and stage 1 through stage 4 chronic kidney disease, or unspecified chronic kidney disease: Secondary | ICD-10-CM | POA: Diagnosis not present

## 2017-12-14 DIAGNOSIS — I70209 Unspecified atherosclerosis of native arteries of extremities, unspecified extremity: Secondary | ICD-10-CM | POA: Diagnosis not present

## 2017-12-14 DIAGNOSIS — I5022 Chronic systolic (congestive) heart failure: Secondary | ICD-10-CM | POA: Diagnosis not present

## 2017-12-14 DIAGNOSIS — G40909 Epilepsy, unspecified, not intractable, without status epilepticus: Secondary | ICD-10-CM | POA: Diagnosis not present

## 2017-12-14 DIAGNOSIS — N184 Chronic kidney disease, stage 4 (severe): Secondary | ICD-10-CM | POA: Diagnosis not present

## 2017-12-18 DIAGNOSIS — I5022 Chronic systolic (congestive) heart failure: Secondary | ICD-10-CM | POA: Diagnosis not present

## 2017-12-18 DIAGNOSIS — G40909 Epilepsy, unspecified, not intractable, without status epilepticus: Secondary | ICD-10-CM | POA: Diagnosis not present

## 2017-12-18 DIAGNOSIS — I70209 Unspecified atherosclerosis of native arteries of extremities, unspecified extremity: Secondary | ICD-10-CM | POA: Diagnosis not present

## 2017-12-18 DIAGNOSIS — N184 Chronic kidney disease, stage 4 (severe): Secondary | ICD-10-CM | POA: Diagnosis not present

## 2017-12-18 DIAGNOSIS — I130 Hypertensive heart and chronic kidney disease with heart failure and stage 1 through stage 4 chronic kidney disease, or unspecified chronic kidney disease: Secondary | ICD-10-CM | POA: Diagnosis not present

## 2017-12-19 DIAGNOSIS — I5032 Chronic diastolic (congestive) heart failure: Secondary | ICD-10-CM | POA: Diagnosis not present

## 2017-12-24 DIAGNOSIS — N184 Chronic kidney disease, stage 4 (severe): Secondary | ICD-10-CM | POA: Diagnosis not present

## 2017-12-24 DIAGNOSIS — G40909 Epilepsy, unspecified, not intractable, without status epilepticus: Secondary | ICD-10-CM | POA: Diagnosis not present

## 2017-12-24 DIAGNOSIS — I130 Hypertensive heart and chronic kidney disease with heart failure and stage 1 through stage 4 chronic kidney disease, or unspecified chronic kidney disease: Secondary | ICD-10-CM | POA: Diagnosis not present

## 2017-12-24 DIAGNOSIS — I70209 Unspecified atherosclerosis of native arteries of extremities, unspecified extremity: Secondary | ICD-10-CM | POA: Diagnosis not present

## 2017-12-24 DIAGNOSIS — I5022 Chronic systolic (congestive) heart failure: Secondary | ICD-10-CM | POA: Diagnosis not present

## 2017-12-25 ENCOUNTER — Other Ambulatory Visit (HOSPITAL_COMMUNITY): Payer: Self-pay | Admitting: *Deleted

## 2017-12-26 ENCOUNTER — Ambulatory Visit (HOSPITAL_COMMUNITY)
Admission: RE | Admit: 2017-12-26 | Discharge: 2017-12-26 | Disposition: A | Discharge status: Home / Self Care | Payer: Medicare Other | Source: Ambulatory Visit | Attending: Nephrology | Admitting: Nephrology

## 2017-12-26 DIAGNOSIS — N189 Chronic kidney disease, unspecified: Secondary | ICD-10-CM | POA: Diagnosis not present

## 2017-12-26 DIAGNOSIS — D631 Anemia in chronic kidney disease: Secondary | ICD-10-CM | POA: Insufficient documentation

## 2017-12-26 MED ORDER — SODIUM CHLORIDE 0.9 % IV SOLN
510.0000 mg | INTRAVENOUS | Status: DC
  Administered 2017-12-26: 510 mg via INTRAVENOUS
  Filled 2017-12-26: qty 17

## 2017-12-26 NOTE — Discharge Instructions (Signed)

## 2018-01-01 DIAGNOSIS — G40909 Epilepsy, unspecified, not intractable, without status epilepticus: Secondary | ICD-10-CM | POA: Diagnosis not present

## 2018-01-01 DIAGNOSIS — I70209 Unspecified atherosclerosis of native arteries of extremities, unspecified extremity: Secondary | ICD-10-CM | POA: Diagnosis not present

## 2018-01-01 DIAGNOSIS — N184 Chronic kidney disease, stage 4 (severe): Secondary | ICD-10-CM | POA: Diagnosis not present

## 2018-01-01 DIAGNOSIS — I130 Hypertensive heart and chronic kidney disease with heart failure and stage 1 through stage 4 chronic kidney disease, or unspecified chronic kidney disease: Secondary | ICD-10-CM | POA: Diagnosis not present

## 2018-01-01 DIAGNOSIS — I5022 Chronic systolic (congestive) heart failure: Secondary | ICD-10-CM | POA: Diagnosis not present

## 2018-01-02 DIAGNOSIS — I10 Essential (primary) hypertension: Secondary | ICD-10-CM | POA: Diagnosis not present

## 2018-01-02 DIAGNOSIS — N185 Chronic kidney disease, stage 5: Secondary | ICD-10-CM | POA: Diagnosis not present

## 2018-01-02 DIAGNOSIS — I701 Atherosclerosis of renal artery: Secondary | ICD-10-CM | POA: Diagnosis not present

## 2018-01-04 ENCOUNTER — Ambulatory Visit (INDEPENDENT_AMBULATORY_CARE_PROVIDER_SITE_OTHER)
Admission: RE | Admit: 2018-01-04 | Discharge: 2018-01-04 | Disposition: A | Discharge status: Home / Self Care | Payer: Medicare Other | Source: Ambulatory Visit | Attending: Vascular Surgery | Admitting: Vascular Surgery

## 2018-01-04 ENCOUNTER — Ambulatory Visit (HOSPITAL_COMMUNITY)
Admission: RE | Admit: 2018-01-04 | Discharge: 2018-01-04 | Disposition: A | Discharge status: Home / Self Care | Payer: Medicare Other | Source: Ambulatory Visit | Attending: Vascular Surgery | Admitting: Vascular Surgery

## 2018-01-04 ENCOUNTER — Ambulatory Visit (INDEPENDENT_AMBULATORY_CARE_PROVIDER_SITE_OTHER): Payer: Medicare Other | Admitting: Vascular Surgery

## 2018-01-04 ENCOUNTER — Encounter: Payer: Self-pay | Admitting: Vascular Surgery

## 2018-01-04 DIAGNOSIS — N185 Chronic kidney disease, stage 5: Secondary | ICD-10-CM | POA: Insufficient documentation

## 2018-01-04 DIAGNOSIS — N189 Chronic kidney disease, unspecified: Secondary | ICD-10-CM

## 2018-01-04 DIAGNOSIS — N179 Acute kidney failure, unspecified: Secondary | ICD-10-CM | POA: Insufficient documentation

## 2018-01-04 DIAGNOSIS — Z01810 Encounter for preprocedural cardiovascular examination: Secondary | ICD-10-CM | POA: Insufficient documentation

## 2018-01-04 NOTE — Progress Notes (Signed)
Requested by:  Estanislado Emms, MD [address]                          [place], [place] [address]  Reason for consultation: New access   History of Present Illness   [name] is a 84 y.o. (1930/06/02) female who presents for evaluation for permanent access.  The patient is right hand dominant.  The patient has not had previous access procedures.  Previous central venous cannulation procedures include: none.  The patient has never had a PPM placed.   Past Medical History:  Diagnosis Date  . Anxiety   . AVM (arteriovenous malformation) of colon   . CAD (coronary artery disease)   . Cervical polyp   . Chronic kidney disease   . Diverticulosis   . Esophageal stricture   . GERD (gastroesophageal reflux disease)   . Hiatal hernia   . Hypertension   . Iron deficiency anemia   . Osteoarthritis   . Seizures ([place])   . Tubular adenoma of colon 2014  . Ventricular hypertrophy     Past Surgical History:  Procedure Laterality Date  . ANGIOPLASTY  1999  . Bilateral Total Knee Replacements     Left Knee-12/29/2003, Right Knee-04/10/2002  . BREAST BIOPSY Right 2015  . BREAST EXCISIONAL BIOPSY Left   . BREAST SURGERY    . CARDIAC CATHETERIZATION    . CARPAL TUNNEL RELEASE    . CATARACT EXTRACTION    . COLONOSCOPY    . COLONOSCOPY W/ BIOPSIES    . CORONARY ANGIOPLASTY    . INGUINAL HERNIA REPAIR  08/08/2006   RIGHT INGUINAL HERNIA REPAIR WITH MESH  . JOINT REPLACEMENT    . KNEE ARTHROSCOPY    . LAPAROSCOPIC APPENDECTOMY    . LAPAROSCOPIC CHOLECYSTECTOMY    . MAJOR DUCT EXCISION OF LEFT BREAST  06/01/2004  . RIGHT SHOULDER ARTHROSCOPY  12/18/2006  . Right Total Hip Athroplasty  01/23/2002  . TONSILLECTOMY      Social History   Socioeconomic History  . Marital status: Widowed    Spouse name: Not on file  . Number of children: Not on file  . Years of education: Not on file  . Highest education level: Not on file  Social Needs  . Financial resource strain: Not on file  .  Food insecurity - worry: Not on file  . Food insecurity - inability: Not on file  . Transportation needs - medical: Not on file  . Transportation needs - non-medical: Not on file  Occupational History  . Not on file  Tobacco Use  . Smoking status: Former Research scientist (life sciences)  . Smokeless tobacco: Never Used  Substance and Sexual Activity  . Alcohol use: No  . Drug use: No  . Sexual activity: No  Other Topics Concern  . Not on file  Social History Narrative  . Not on file    Family History  Problem Relation Age of Onset  . Hypertension Mother   . Stroke Mother   . Cancer Father   . Cancer Sister   . Other Brother        brain tumor  . Breast cancer Paternal Aunt        in 42's or 39's    Current Outpatient Medications  Medication Sig Dispense Refill  . amLODipine (NORVASC) 10 MG tablet TAKE 1 TABLET(10 MG) BY MOUTH DAILY 30 tablet 11  . clopidogrel (PLAVIX) 75 MG tablet Take 75 mg  by mouth daily.     Marland Kitchen doxazosin (CARDURA) 8 MG tablet TAKE 1 TABLET(8 MG) BY MOUTH DAILY 30 tablet 0  . hydrALAZINE (APRESOLINE) 50 MG tablet Take 1 tablet (50 mg total) by mouth 3 (three) times daily. 90 tablet 0  . levETIRAcetam (KEPPRA) 500 MG tablet Take 500 mg by mouth 2 (two) times daily.    . sacubitril-valsartan (ENTRESTO) 24-26 MG Take 0.5 tablets by mouth daily.      No current facility-administered medications for this visit.     Allergies  Allergen Reactions  . Haldol [Haloperidol Decanoate] Other (See Comments)    Agitation, aggressive and combative  . Lorazepam Other (See Comments)    Agitation, lethargic  . Cephalexin Other (See Comments)    REACTION: Questionable, no reaction listed    REVIEW OF SYSTEMS (negative unless checked):   Cardiac:  []  Chest pain or chest pressure? []  Shortness of breath upon activity? []  Shortness of breath when lying flat? [x]  Irregular heart rhythm?  Vascular:  []  Pain in calf, thigh, or hip brought on by walking? []  Pain in feet at night that  wakes you up from your sleep? []  Blood clot in your veins? []  Leg swelling?  Pulmonary:  []  Oxygen at home? []  Productive cough? []  Wheezing?  Neurologic:  []  Sudden weakness in arms or legs? []  Sudden numbness in arms or legs? []  Sudden onset of difficult speaking or slurred speech? []  Temporary loss of vision in one eye? []  Problems with dizziness?  Gastrointestinal:  []  Blood in stool? []  Vomited blood?  Genitourinary:  []  Burning when urinating? []  Blood in urine?  Psychiatric:  []  Major depression  Hematologic:  []  Bleeding problems? []  Problems with blood clotting?  Dermatologic:  []  Rashes or ulcers?  Constitutional:  []  Fever or chills?  Ear/Nose/Throat:  []  Change in hearing? []  Nose bleeds? []  Sore throat?  Musculoskeletal:  []  Back pain? []  Joint pain? []  Muscle pain?   Physical Examination     Vitals:   01/04/18 0929  BP: (!) 178/65  Pulse: 65  Resp: 20  Temp: (!) 97 F (36.1 C)  TempSrc: Oral  SpO2: 96%  Weight: 153 lb (69.4 kg)  Height: 5\' 2"  (1.575 m)   Body mass index is 27.98 kg/m.  General Alert, O x 3, WD, NAD  Head [place]/AT,    Ear/Nose/ Throat Hearing grossly intact, nares without erythema or drainage, oropharynx without Erythema or Exudate, Mallampati score: 3,   Eyes PERRLA, EOMI,    Neck Supple, mid-line trachea,    Pulmonary Sym exp, good B air movt, CTA B  Cardiac RRR, Nl S1, S2, no Murmurs, No rubs, No S3,S4  Vascular Vessel Right Left  Radial Palpable Palpable  Brachial Palpable Palpable  Carotid Palpable, No Bruit Palpable, No Bruit  Aorta Not palpable N/A  Femoral Palpable Palpable  Popliteal Not palpable Not palpable  PT Not palpable Not palpable  DP Faintly palpable Faintly palpable    Gastro- intestinal soft, non-distended, non-tender to palpation, No guarding or rebound, no HSM, no masses, no CVAT B, No palpable prominent aortic pulse,    Musculo- skeletal M/S 5/5 throughout  , Extremities without  ischemic changes  , No edema present, No visible varicosities , No Lipodermatosclerosis present  Neurologic Cranial nerves 2-12 intact , Pain and light touch intact in extremities , Motor exam as listed above  Psychiatric Judgement intact, Mood & affect appropriate for pt's clinical situation  Dermatologic See M/S exam for extremity exam,  No rashes otherwise noted  Lymphatic  Palpable lymph nodes: None     Non-invasive Vascular Imaging   BUE Vein Mapping  (Date: 01/04/2018):   R arm: acceptable vein conduits include upper arm basilic  L arm: acceptable vein conduits include none  BUE Doppler (Date: 01/04/2018):   R arm:   Brachial: bi, 3.8 mm  Radial: tri, 2.2 mm  Ulnar: tri, 1.2 mm  L arm:   Brachial: bi, 5.2 mm  Radial: tri, 2.4 mm  Ulnar: bi, 1.5 mm   Outside Studies/Documentation   4 pages of outside documents were reviewed including: outside nephrology chart.   Medical Decision Making   [name] is a 84 y.o. female who presents with chronic kidney disease stage V   Based on vein mapping and examination, this patient's permanent access options include: R 1st BVT vs AVG placement.  I had an extensive discussion with this patient in regards to the nature of access surgery, including risk, benefits, and alternatives.    The patient is aware that the risks of access surgery include but are not limited to: bleeding, infection, steal syndrome, nerve damage, ischemic monomelic neuropathy, failure of access to mature, complications related to venous hypertension, and possible need for additional access procedures in the future. I discussed with the patient the nature of the staged access procedure, specifically the need for a second operation to transpose the first stage fistula if it matures adequately.    The patient has NOT agreed to proceed with the above procedure.  Pt will contact us when she is ready to proceed.   Adele Barthel, MD, FACS [facility] Office: [phone] Pager: [phone]  01/04/2018, 12:23 PM

## 2018-01-07 DIAGNOSIS — G40909 Epilepsy, unspecified, not intractable, without status epilepticus: Secondary | ICD-10-CM | POA: Diagnosis not present

## 2018-01-07 DIAGNOSIS — I5022 Chronic systolic (congestive) heart failure: Secondary | ICD-10-CM | POA: Diagnosis not present

## 2018-01-07 DIAGNOSIS — N184 Chronic kidney disease, stage 4 (severe): Secondary | ICD-10-CM | POA: Diagnosis not present

## 2018-01-07 DIAGNOSIS — I70209 Unspecified atherosclerosis of native arteries of extremities, unspecified extremity: Secondary | ICD-10-CM | POA: Diagnosis not present

## 2018-01-07 DIAGNOSIS — I130 Hypertensive heart and chronic kidney disease with heart failure and stage 1 through stage 4 chronic kidney disease, or unspecified chronic kidney disease: Secondary | ICD-10-CM | POA: Diagnosis not present

## 2018-01-09 ENCOUNTER — Ambulatory Visit (HOSPITAL_COMMUNITY)
Admission: RE | Admit: 2018-01-09 | Discharge: 2018-01-09 | Disposition: A | Discharge status: Home / Self Care | Payer: Medicare Other | Source: Ambulatory Visit | Attending: Nephrology | Admitting: Nephrology

## 2018-01-09 DIAGNOSIS — D631 Anemia in chronic kidney disease: Secondary | ICD-10-CM | POA: Diagnosis not present

## 2018-01-09 DIAGNOSIS — N189 Chronic kidney disease, unspecified: Secondary | ICD-10-CM | POA: Diagnosis not present

## 2018-01-09 MED ORDER — SODIUM CHLORIDE 0.9 % IV SOLN
510.0000 mg | INTRAVENOUS | Status: AC
  Administered 2018-01-09: 510 mg via INTRAVENOUS
  Filled 2018-01-09: qty 17

## 2018-01-10 DIAGNOSIS — G40909 Epilepsy, unspecified, not intractable, without status epilepticus: Secondary | ICD-10-CM | POA: Diagnosis not present

## 2018-01-10 DIAGNOSIS — N184 Chronic kidney disease, stage 4 (severe): Secondary | ICD-10-CM | POA: Diagnosis not present

## 2018-01-10 DIAGNOSIS — I70209 Unspecified atherosclerosis of native arteries of extremities, unspecified extremity: Secondary | ICD-10-CM | POA: Diagnosis not present

## 2018-01-10 DIAGNOSIS — I130 Hypertensive heart and chronic kidney disease with heart failure and stage 1 through stage 4 chronic kidney disease, or unspecified chronic kidney disease: Secondary | ICD-10-CM | POA: Diagnosis not present

## 2018-01-10 DIAGNOSIS — I5022 Chronic systolic (congestive) heart failure: Secondary | ICD-10-CM | POA: Diagnosis not present

## 2018-01-12 DIAGNOSIS — I130 Hypertensive heart and chronic kidney disease with heart failure and stage 1 through stage 4 chronic kidney disease, or unspecified chronic kidney disease: Secondary | ICD-10-CM | POA: Diagnosis not present

## 2018-01-12 DIAGNOSIS — I70209 Unspecified atherosclerosis of native arteries of extremities, unspecified extremity: Secondary | ICD-10-CM | POA: Diagnosis not present

## 2018-01-12 DIAGNOSIS — G40909 Epilepsy, unspecified, not intractable, without status epilepticus: Secondary | ICD-10-CM | POA: Diagnosis not present

## 2018-01-12 DIAGNOSIS — N184 Chronic kidney disease, stage 4 (severe): Secondary | ICD-10-CM | POA: Diagnosis not present

## 2018-01-12 DIAGNOSIS — I5022 Chronic systolic (congestive) heart failure: Secondary | ICD-10-CM | POA: Diagnosis not present

## 2018-01-15 DIAGNOSIS — I5022 Chronic systolic (congestive) heart failure: Secondary | ICD-10-CM | POA: Diagnosis not present

## 2018-01-15 DIAGNOSIS — N184 Chronic kidney disease, stage 4 (severe): Secondary | ICD-10-CM | POA: Diagnosis not present

## 2018-01-15 DIAGNOSIS — I130 Hypertensive heart and chronic kidney disease with heart failure and stage 1 through stage 4 chronic kidney disease, or unspecified chronic kidney disease: Secondary | ICD-10-CM | POA: Diagnosis not present

## 2018-01-15 DIAGNOSIS — I70209 Unspecified atherosclerosis of native arteries of extremities, unspecified extremity: Secondary | ICD-10-CM | POA: Diagnosis not present

## 2018-01-15 DIAGNOSIS — G40909 Epilepsy, unspecified, not intractable, without status epilepticus: Secondary | ICD-10-CM | POA: Diagnosis not present

## 2018-01-17 DIAGNOSIS — I5022 Chronic systolic (congestive) heart failure: Secondary | ICD-10-CM | POA: Diagnosis not present

## 2018-01-17 DIAGNOSIS — N184 Chronic kidney disease, stage 4 (severe): Secondary | ICD-10-CM | POA: Diagnosis not present

## 2018-01-17 DIAGNOSIS — G40909 Epilepsy, unspecified, not intractable, without status epilepticus: Secondary | ICD-10-CM | POA: Diagnosis not present

## 2018-01-17 DIAGNOSIS — I70209 Unspecified atherosclerosis of native arteries of extremities, unspecified extremity: Secondary | ICD-10-CM | POA: Diagnosis not present

## 2018-01-17 DIAGNOSIS — I130 Hypertensive heart and chronic kidney disease with heart failure and stage 1 through stage 4 chronic kidney disease, or unspecified chronic kidney disease: Secondary | ICD-10-CM | POA: Diagnosis not present

## 2018-01-23 DIAGNOSIS — G40909 Epilepsy, unspecified, not intractable, without status epilepticus: Secondary | ICD-10-CM | POA: Diagnosis not present

## 2018-01-23 DIAGNOSIS — I5022 Chronic systolic (congestive) heart failure: Secondary | ICD-10-CM | POA: Diagnosis not present

## 2018-01-23 DIAGNOSIS — N184 Chronic kidney disease, stage 4 (severe): Secondary | ICD-10-CM | POA: Diagnosis not present

## 2018-01-23 DIAGNOSIS — I130 Hypertensive heart and chronic kidney disease with heart failure and stage 1 through stage 4 chronic kidney disease, or unspecified chronic kidney disease: Secondary | ICD-10-CM | POA: Diagnosis not present

## 2018-01-23 DIAGNOSIS — I70209 Unspecified atherosclerosis of native arteries of extremities, unspecified extremity: Secondary | ICD-10-CM | POA: Diagnosis not present

## 2018-01-30 DIAGNOSIS — G40909 Epilepsy, unspecified, not intractable, without status epilepticus: Secondary | ICD-10-CM | POA: Diagnosis not present

## 2018-01-30 DIAGNOSIS — I5022 Chronic systolic (congestive) heart failure: Secondary | ICD-10-CM | POA: Diagnosis not present

## 2018-01-30 DIAGNOSIS — I130 Hypertensive heart and chronic kidney disease with heart failure and stage 1 through stage 4 chronic kidney disease, or unspecified chronic kidney disease: Secondary | ICD-10-CM | POA: Diagnosis not present

## 2018-01-30 DIAGNOSIS — I70209 Unspecified atherosclerosis of native arteries of extremities, unspecified extremity: Secondary | ICD-10-CM | POA: Diagnosis not present

## 2018-01-30 DIAGNOSIS — N184 Chronic kidney disease, stage 4 (severe): Secondary | ICD-10-CM | POA: Diagnosis not present

## 2018-02-04 DIAGNOSIS — I509 Heart failure, unspecified: Secondary | ICD-10-CM | POA: Diagnosis not present

## 2018-02-05 DIAGNOSIS — G40909 Epilepsy, unspecified, not intractable, without status epilepticus: Secondary | ICD-10-CM | POA: Diagnosis not present

## 2018-02-05 DIAGNOSIS — I5022 Chronic systolic (congestive) heart failure: Secondary | ICD-10-CM | POA: Diagnosis not present

## 2018-02-05 DIAGNOSIS — I70209 Unspecified atherosclerosis of native arteries of extremities, unspecified extremity: Secondary | ICD-10-CM | POA: Diagnosis not present

## 2018-02-05 DIAGNOSIS — I130 Hypertensive heart and chronic kidney disease with heart failure and stage 1 through stage 4 chronic kidney disease, or unspecified chronic kidney disease: Secondary | ICD-10-CM | POA: Diagnosis not present

## 2018-02-05 DIAGNOSIS — N184 Chronic kidney disease, stage 4 (severe): Secondary | ICD-10-CM | POA: Diagnosis not present

## 2018-02-11 DIAGNOSIS — I70209 Unspecified atherosclerosis of native arteries of extremities, unspecified extremity: Secondary | ICD-10-CM | POA: Diagnosis not present

## 2018-02-11 DIAGNOSIS — G40909 Epilepsy, unspecified, not intractable, without status epilepticus: Secondary | ICD-10-CM | POA: Diagnosis not present

## 2018-02-11 DIAGNOSIS — N184 Chronic kidney disease, stage 4 (severe): Secondary | ICD-10-CM | POA: Diagnosis not present

## 2018-02-11 DIAGNOSIS — I130 Hypertensive heart and chronic kidney disease with heart failure and stage 1 through stage 4 chronic kidney disease, or unspecified chronic kidney disease: Secondary | ICD-10-CM | POA: Diagnosis not present

## 2018-02-11 DIAGNOSIS — I5022 Chronic systolic (congestive) heart failure: Secondary | ICD-10-CM | POA: Diagnosis not present

## 2018-02-12 DIAGNOSIS — I10 Essential (primary) hypertension: Secondary | ICD-10-CM | POA: Diagnosis not present

## 2018-02-12 DIAGNOSIS — Z6829 Body mass index (BMI) 29.0-29.9, adult: Secondary | ICD-10-CM | POA: Diagnosis not present

## 2018-02-12 DIAGNOSIS — I5032 Chronic diastolic (congestive) heart failure: Secondary | ICD-10-CM | POA: Diagnosis not present

## 2018-02-12 DIAGNOSIS — E785 Hyperlipidemia, unspecified: Secondary | ICD-10-CM | POA: Diagnosis not present

## 2018-02-12 DIAGNOSIS — N185 Chronic kidney disease, stage 5: Secondary | ICD-10-CM | POA: Diagnosis not present

## 2018-02-12 DIAGNOSIS — N186 End stage renal disease: Secondary | ICD-10-CM | POA: Diagnosis not present

## 2018-02-15 DIAGNOSIS — G40909 Epilepsy, unspecified, not intractable, without status epilepticus: Secondary | ICD-10-CM | POA: Diagnosis not present

## 2018-02-15 DIAGNOSIS — N184 Chronic kidney disease, stage 4 (severe): Secondary | ICD-10-CM | POA: Diagnosis not present

## 2018-02-15 DIAGNOSIS — I130 Hypertensive heart and chronic kidney disease with heart failure and stage 1 through stage 4 chronic kidney disease, or unspecified chronic kidney disease: Secondary | ICD-10-CM | POA: Diagnosis not present

## 2018-02-15 DIAGNOSIS — I70209 Unspecified atherosclerosis of native arteries of extremities, unspecified extremity: Secondary | ICD-10-CM | POA: Diagnosis not present

## 2018-02-15 DIAGNOSIS — I5022 Chronic systolic (congestive) heart failure: Secondary | ICD-10-CM | POA: Diagnosis not present

## 2018-02-19 DIAGNOSIS — I701 Atherosclerosis of renal artery: Secondary | ICD-10-CM | POA: Diagnosis not present

## 2018-02-19 DIAGNOSIS — N185 Chronic kidney disease, stage 5: Secondary | ICD-10-CM | POA: Diagnosis not present

## 2018-02-19 DIAGNOSIS — N261 Atrophy of kidney (terminal): Secondary | ICD-10-CM | POA: Diagnosis not present

## 2018-02-19 DIAGNOSIS — I120 Hypertensive chronic kidney disease with stage 5 chronic kidney disease or end stage renal disease: Secondary | ICD-10-CM | POA: Diagnosis not present

## 2018-02-21 DIAGNOSIS — G40909 Epilepsy, unspecified, not intractable, without status epilepticus: Secondary | ICD-10-CM | POA: Diagnosis not present

## 2018-02-21 DIAGNOSIS — I130 Hypertensive heart and chronic kidney disease with heart failure and stage 1 through stage 4 chronic kidney disease, or unspecified chronic kidney disease: Secondary | ICD-10-CM | POA: Diagnosis not present

## 2018-02-21 DIAGNOSIS — N184 Chronic kidney disease, stage 4 (severe): Secondary | ICD-10-CM | POA: Diagnosis not present

## 2018-02-21 DIAGNOSIS — I70209 Unspecified atherosclerosis of native arteries of extremities, unspecified extremity: Secondary | ICD-10-CM | POA: Diagnosis not present

## 2018-02-21 DIAGNOSIS — I5022 Chronic systolic (congestive) heart failure: Secondary | ICD-10-CM | POA: Diagnosis not present

## 2018-02-27 DIAGNOSIS — I70209 Unspecified atherosclerosis of native arteries of extremities, unspecified extremity: Secondary | ICD-10-CM | POA: Diagnosis not present

## 2018-02-27 DIAGNOSIS — N184 Chronic kidney disease, stage 4 (severe): Secondary | ICD-10-CM | POA: Diagnosis not present

## 2018-02-27 DIAGNOSIS — I5022 Chronic systolic (congestive) heart failure: Secondary | ICD-10-CM | POA: Diagnosis not present

## 2018-02-27 DIAGNOSIS — G40909 Epilepsy, unspecified, not intractable, without status epilepticus: Secondary | ICD-10-CM | POA: Diagnosis not present

## 2018-02-27 DIAGNOSIS — I130 Hypertensive heart and chronic kidney disease with heart failure and stage 1 through stage 4 chronic kidney disease, or unspecified chronic kidney disease: Secondary | ICD-10-CM | POA: Diagnosis not present

## 2018-03-01 ENCOUNTER — Other Ambulatory Visit: Payer: Self-pay | Admitting: Physician Assistant

## 2018-03-10 DIAGNOSIS — I5022 Chronic systolic (congestive) heart failure: Secondary | ICD-10-CM | POA: Diagnosis not present

## 2018-03-10 DIAGNOSIS — I130 Hypertensive heart and chronic kidney disease with heart failure and stage 1 through stage 4 chronic kidney disease, or unspecified chronic kidney disease: Secondary | ICD-10-CM | POA: Diagnosis not present

## 2018-03-10 DIAGNOSIS — G40909 Epilepsy, unspecified, not intractable, without status epilepticus: Secondary | ICD-10-CM | POA: Diagnosis not present

## 2018-03-10 DIAGNOSIS — N184 Chronic kidney disease, stage 4 (severe): Secondary | ICD-10-CM | POA: Diagnosis not present

## 2018-03-10 DIAGNOSIS — I70209 Unspecified atherosclerosis of native arteries of extremities, unspecified extremity: Secondary | ICD-10-CM | POA: Diagnosis not present

## 2018-03-13 DIAGNOSIS — I130 Hypertensive heart and chronic kidney disease with heart failure and stage 1 through stage 4 chronic kidney disease, or unspecified chronic kidney disease: Secondary | ICD-10-CM | POA: Diagnosis not present

## 2018-03-13 DIAGNOSIS — I70209 Unspecified atherosclerosis of native arteries of extremities, unspecified extremity: Secondary | ICD-10-CM | POA: Diagnosis not present

## 2018-03-13 DIAGNOSIS — N184 Chronic kidney disease, stage 4 (severe): Secondary | ICD-10-CM | POA: Diagnosis not present

## 2018-03-13 DIAGNOSIS — G40909 Epilepsy, unspecified, not intractable, without status epilepticus: Secondary | ICD-10-CM | POA: Diagnosis not present

## 2018-03-13 DIAGNOSIS — I5022 Chronic systolic (congestive) heart failure: Secondary | ICD-10-CM | POA: Diagnosis not present

## 2018-03-19 ENCOUNTER — Other Ambulatory Visit: Payer: Self-pay | Admitting: *Deleted

## 2018-03-20 ENCOUNTER — Telehealth: Payer: Self-pay | Admitting: *Deleted

## 2018-03-20 DIAGNOSIS — G40909 Epilepsy, unspecified, not intractable, without status epilepticus: Secondary | ICD-10-CM | POA: Diagnosis not present

## 2018-03-20 DIAGNOSIS — I130 Hypertensive heart and chronic kidney disease with heart failure and stage 1 through stage 4 chronic kidney disease, or unspecified chronic kidney disease: Secondary | ICD-10-CM | POA: Diagnosis not present

## 2018-03-20 DIAGNOSIS — I70209 Unspecified atherosclerosis of native arteries of extremities, unspecified extremity: Secondary | ICD-10-CM | POA: Diagnosis not present

## 2018-03-20 DIAGNOSIS — I5022 Chronic systolic (congestive) heart failure: Secondary | ICD-10-CM | POA: Diagnosis not present

## 2018-03-20 DIAGNOSIS — N184 Chronic kidney disease, stage 4 (severe): Secondary | ICD-10-CM | POA: Diagnosis not present

## 2018-03-20 NOTE — Telephone Encounter (Signed)
Call to patient's daughter DENISE to be at [facility] at 5:30 am on 04/05/18 for surgery. NPO past MN. Hold Plavix for 5 days prior to surgery. Expect a call and follow the detailed instructions received from the hospital pre-admission department about this surgery. Will need driver for home. Verbalized understanding.

## 2018-03-22 ENCOUNTER — Encounter: Payer: Self-pay | Admitting: Vascular Surgery

## 2018-03-26 DIAGNOSIS — I10 Essential (primary) hypertension: Secondary | ICD-10-CM | POA: Diagnosis not present

## 2018-03-26 DIAGNOSIS — I509 Heart failure, unspecified: Secondary | ICD-10-CM | POA: Diagnosis not present

## 2018-03-26 DIAGNOSIS — N185 Chronic kidney disease, stage 5: Secondary | ICD-10-CM | POA: Diagnosis not present

## 2018-03-27 DIAGNOSIS — I5022 Chronic systolic (congestive) heart failure: Secondary | ICD-10-CM | POA: Diagnosis not present

## 2018-03-27 DIAGNOSIS — I70209 Unspecified atherosclerosis of native arteries of extremities, unspecified extremity: Secondary | ICD-10-CM | POA: Diagnosis not present

## 2018-03-27 DIAGNOSIS — I130 Hypertensive heart and chronic kidney disease with heart failure and stage 1 through stage 4 chronic kidney disease, or unspecified chronic kidney disease: Secondary | ICD-10-CM | POA: Diagnosis not present

## 2018-03-27 DIAGNOSIS — N184 Chronic kidney disease, stage 4 (severe): Secondary | ICD-10-CM | POA: Diagnosis not present

## 2018-03-27 DIAGNOSIS — G40909 Epilepsy, unspecified, not intractable, without status epilepticus: Secondary | ICD-10-CM | POA: Diagnosis not present

## 2018-04-03 ENCOUNTER — Encounter (HOSPITAL_COMMUNITY): Payer: Self-pay | Admitting: *Deleted

## 2018-04-03 ENCOUNTER — Other Ambulatory Visit: Payer: Self-pay

## 2018-04-03 DIAGNOSIS — N184 Chronic kidney disease, stage 4 (severe): Secondary | ICD-10-CM | POA: Diagnosis not present

## 2018-04-03 DIAGNOSIS — I5022 Chronic systolic (congestive) heart failure: Secondary | ICD-10-CM | POA: Diagnosis not present

## 2018-04-03 DIAGNOSIS — G40909 Epilepsy, unspecified, not intractable, without status epilepticus: Secondary | ICD-10-CM | POA: Diagnosis not present

## 2018-04-03 DIAGNOSIS — I130 Hypertensive heart and chronic kidney disease with heart failure and stage 1 through stage 4 chronic kidney disease, or unspecified chronic kidney disease: Secondary | ICD-10-CM | POA: Diagnosis not present

## 2018-04-03 DIAGNOSIS — I70209 Unspecified atherosclerosis of native arteries of extremities, unspecified extremity: Secondary | ICD-10-CM | POA: Diagnosis not present

## 2018-04-03 NOTE — Progress Notes (Signed)
Pt denies any acute cardiopulmonary issues. Pt under the care of [name], Cardiology. Pt denies having a cardiac cath. Pt stated that her last dose of Plavix was Sat. as instructed by MD. Pt denies having a chest x ray within the last year. Pt verbalized understaning of all pre-op instructions. Pt chart forwarded to anesthesia for review.

## 2018-04-04 NOTE — Progress Notes (Signed)
Anesthesia Chart Review: SAME DAY WORK-UP  Case:  [id number] Date/Time:  04/05/18 0715   Procedure:  BASILIC VEIN TRANSPOSITION FIRST STAGE VERSUS ARTERIOVENOUS GRAFT RIGHT ARM (Right )   Anesthesia type:  Monitor Anesthesia Care   Pre-op diagnosis:  CHRONIC KIDNEY DISEASE STAGE V FOR HEMODIALYSIS ACCESS   Location:  [facility] OR ROOM 11 / [facility]   Surgeon:  Conrad JAARS, MD      DISCUSSION: Patient is an 84 year old female scheduled for the above procedure. She is not yet on hemodialysis. [facility] notes indicates that patient's primary caregiver is her daughter Langley Gauss.   History includes former smoker, chronic left BBB, chronic diastolic CHF, left renal artery stenosis (renal artery angioplasty '99), CKD stage IV, HTN, SDH (very small right occipital SDH 02/15/10 MRI), seizures, asthma. 01/2010 head CT showed evidence of remote lacunar infarcts of the basal ganglia bilaterally. CAD is listed in her history, but no mention in cardiology notes--only PAD, renal artery angioplasty, diastolic CHF, LBBB, HTN, and possible PAF in the past (but not well documented). Last Plavix dose 03/30/18. Denied having prior cardiac cath.   Anesthesiologist to evaluate on the day of surgery, but based on current information, I would anticipate that she can proceed as planned if no acute changes and vitals and labs acceptable. (BP 178/65 at [facility] on 01/04/18.)  PROVIDERS: Lucianne Lei, MD Patient Care Team: Estanislado Emms, MD as Consulting Physician (Nephrology) Nahser, Wonda Cheng, MD as Consulting Physician (Cardiology) Last visit 10/19/17 for HTN, PAD follow-up. No new testing ordered with six month follow-up recommended.  LABS: She is for labs on the day of surgery.   EKG: 11/09/17: SB at 51 bpm, first-degree AV block, left axis deviation, left bundle branch block.  No significant change since last tracing. She has had a left BBB since at least 2006.    CV: Echo 12/24/15: Study Conclusions - Left ventricle: The cavity size was  normal. There was mild   hypertrophy of the posterior wall and severe hypertrophy of the   septal wall. Systolic function was normal. The estimated ejection   fraction was in the range of 60% to 65%. Wall motion was normal;   there were no regional wall motion abnormalities. Doppler   parameters are consistent with abnormal left ventricular   relaxation (grade 1 diastolic dysfunction). Doppler parameters   are consistent with high ventricular filling pressure. - Aortic valve: Valve area (VTI): 2.74 cm^2. Valve area (Vmax):   2.25 cm^2. Valve area (Vmean): 2.53 cm^2. - Mitral valve: There was trivial regurgitation. - Left atrium: The atrium was moderately dilated. - Right ventricle: The cavity size was normal. Wall thickness was   normal. Systolic function was normal. - Tricuspid valve: There was trivial regurgitation. - Inferior vena cava: The vessel was normal in size. The   respirophasic diameter changes were in the normal range (= 50%),   consistent with normal central venous pressure.  Nuclear stress test 04/24/05 [facility]): Overall impression: LBBB at baseline. Normal adenosine Cardiolite.  There is no evidence of ischemia and there is normal LV function.  EF 87%.  Carotid U/S 02/28/17: Impression: Heterogeneous plaque, bilaterally.  1 to 39% bilateral ICA stenosis.  Patent vertebral arteries with antegrade flow.  Normal subclavian arteries, bilaterally.  Brachial arteries showed multiphasic waveforms, bilaterally.  Renal U/S 10/07/16: Summary: 1. Findings are suggestive of no obvious right renal artery  significant stenosis. The left renal artery was technically  difficult to obtain arterial flow, cannot exclude  renal artery  occlusion. Bilateral kidneys, increased echogenicity with the  left kidney measuring smaller than the right. Abnormal intrarenal resistive index on right.  Past Medical History:  Diagnosis Date  . Anxiety   .  Asthma   . AVM (arteriovenous malformation) of colon   . CAD (coronary artery disease)   . Cervical polyp   . Chronic kidney disease   . Diverticulosis   . Esophageal stricture   . GERD (gastroesophageal reflux disease)   . Hiatal hernia   . Hypertension   . Iron deficiency anemia   . Osteoarthritis   . Seizures ([place])   . Tubular adenoma of colon 2014  . Ventricular hypertrophy     Past Surgical History:  Procedure Laterality Date  . ANGIOPLASTY  1999  . Bilateral Total Knee Replacements     Left Knee-12/29/2003, Right Knee-04/10/2002  . BREAST BIOPSY Right 2015  . BREAST EXCISIONAL BIOPSY Left   . BREAST SURGERY    . CARDIAC CATHETERIZATION    . CARPAL TUNNEL RELEASE    . CATARACT EXTRACTION    . COLONOSCOPY    . COLONOSCOPY W/ BIOPSIES    . CORONARY ANGIOPLASTY    . INGUINAL HERNIA REPAIR  08/08/2006   RIGHT INGUINAL HERNIA REPAIR WITH MESH  . JOINT REPLACEMENT    . KNEE ARTHROSCOPY    . LAPAROSCOPIC APPENDECTOMY    . LAPAROSCOPIC CHOLECYSTECTOMY    . MAJOR DUCT EXCISION OF LEFT BREAST  06/01/2004  . RIGHT SHOULDER ARTHROSCOPY  12/18/2006  . Right Total Hip Athroplasty  01/23/2002  . TONSILLECTOMY      MEDICATIONS: No current facility-administered medications for this encounter.    Marland Kitchen amLODipine (NORVASC) 10 MG tablet  . Carboxymethylcellul-Glycerin (LUBRICATING EYE DROPS OP)  . clopidogrel (PLAVIX) 75 MG tablet  . doxazosin (CARDURA) 8 MG tablet  . furosemide (LASIX) 80 MG tablet  . hydrALAZINE (APRESOLINE) 25 MG tablet  . hydrALAZINE (APRESOLINE) 50 MG tablet  . levETIRAcetam (KEPPRA) 500 MG tablet  . potassium chloride (K-DUR,KLOR-CON) 10 MEQ tablet   [facility] Short Stay Center/Anesthesiology Phone [phone] 04/04/2018 10:45 AM

## 2018-04-05 ENCOUNTER — Ambulatory Visit (HOSPITAL_COMMUNITY)
Admission: RE | Admit: 2018-04-05 | Discharge: 2018-04-05 | Disposition: A | Discharge status: Home / Self Care | Payer: Medicare Other | Source: Ambulatory Visit | Attending: Vascular Surgery | Admitting: Vascular Surgery

## 2018-04-05 ENCOUNTER — Ambulatory Visit (HOSPITAL_COMMUNITY): Payer: Medicare Other | Admitting: Vascular Surgery

## 2018-04-05 ENCOUNTER — Encounter (HOSPITAL_COMMUNITY): Payer: Self-pay

## 2018-04-05 ENCOUNTER — Other Ambulatory Visit: Payer: Self-pay

## 2018-04-05 ENCOUNTER — Encounter (HOSPITAL_COMMUNITY)
Admission: RE | Disposition: A | Discharge status: Home / Self Care | Payer: Self-pay | Source: Ambulatory Visit | Attending: Vascular Surgery

## 2018-04-05 DIAGNOSIS — R569 Unspecified convulsions: Secondary | ICD-10-CM | POA: Diagnosis not present

## 2018-04-05 DIAGNOSIS — N184 Chronic kidney disease, stage 4 (severe): Secondary | ICD-10-CM | POA: Diagnosis not present

## 2018-04-05 DIAGNOSIS — I2510 Atherosclerotic heart disease of native coronary artery without angina pectoris: Secondary | ICD-10-CM | POA: Diagnosis not present

## 2018-04-05 DIAGNOSIS — N185 Chronic kidney disease, stage 5: Secondary | ICD-10-CM | POA: Diagnosis not present

## 2018-04-05 DIAGNOSIS — J45909 Unspecified asthma, uncomplicated: Secondary | ICD-10-CM | POA: Diagnosis not present

## 2018-04-05 DIAGNOSIS — K219 Gastro-esophageal reflux disease without esophagitis: Secondary | ICD-10-CM | POA: Diagnosis not present

## 2018-04-05 DIAGNOSIS — F419 Anxiety disorder, unspecified: Secondary | ICD-10-CM | POA: Diagnosis not present

## 2018-04-05 DIAGNOSIS — I130 Hypertensive heart and chronic kidney disease with heart failure and stage 1 through stage 4 chronic kidney disease, or unspecified chronic kidney disease: Secondary | ICD-10-CM | POA: Diagnosis not present

## 2018-04-05 DIAGNOSIS — I5032 Chronic diastolic (congestive) heart failure: Secondary | ICD-10-CM | POA: Diagnosis not present

## 2018-04-05 DIAGNOSIS — Z87891 Personal history of nicotine dependence: Secondary | ICD-10-CM | POA: Insufficient documentation

## 2018-04-05 DIAGNOSIS — I129 Hypertensive chronic kidney disease with stage 1 through stage 4 chronic kidney disease, or unspecified chronic kidney disease: Secondary | ICD-10-CM | POA: Insufficient documentation

## 2018-04-05 DIAGNOSIS — M1990 Unspecified osteoarthritis, unspecified site: Secondary | ICD-10-CM | POA: Insufficient documentation

## 2018-04-05 HISTORY — PX: BASCILIC VEIN TRANSPOSITION: SHX5742

## 2018-04-05 HISTORY — DX: Unspecified asthma, uncomplicated: J45.909

## 2018-04-05 LAB — POCT I-STAT 4, (NA,K, GLUC, HGB,HCT)
GLUCOSE: 98 mg/dL (ref 65–99)
HEMATOCRIT: 30 % — AB (ref 36.0–46.0)
HEMOGLOBIN: 10.2 g/dL — AB (ref 12.0–15.0)
POTASSIUM: 5.1 mmol/L (ref 3.5–5.1)
SODIUM: 139 mmol/L (ref 135–145)

## 2018-04-05 SURGERY — TRANSPOSITION, VEIN, BASILIC
Anesthesia: Monitor Anesthesia Care | Site: Arm Upper | Laterality: Right

## 2018-04-05 MED ORDER — SODIUM CHLORIDE 0.9 % IV SOLN
INTRAVENOUS | Status: DC
  Administered 2018-04-05: 08:00:00 via INTRAVENOUS

## 2018-04-05 MED ORDER — VANCOMYCIN HCL IN DEXTROSE 1-5 GM/200ML-% IV SOLN
1000.0000 mg | INTRAVENOUS | Status: AC
  Administered 2018-04-05: 1000 mg via INTRAVENOUS
  Filled 2018-04-05: qty 200

## 2018-04-05 MED ORDER — PROPOFOL 500 MG/50ML IV EMUL
INTRAVENOUS | Status: DC | PRN
  Administered 2018-04-05: 50 ug/kg/min via INTRAVENOUS

## 2018-04-05 MED ORDER — SODIUM CHLORIDE 0.9 % IV SOLN
INTRAVENOUS | Status: DC | PRN
  Administered 2018-04-05: 500 mL

## 2018-04-05 MED ORDER — OXYCODONE-ACETAMINOPHEN 5-325 MG PO TABS: 1.0000 | ORAL_TABLET | Freq: Three times a day (TID) | ORAL | 0 refills | Status: DC | PRN

## 2018-04-05 MED ORDER — PHENYLEPHRINE HCL 10 MG/ML IJ SOLN
INTRAMUSCULAR | Status: DC | PRN
  Administered 2018-04-05: 40 ug via INTRAVENOUS

## 2018-04-05 MED ORDER — 0.9 % SODIUM CHLORIDE (POUR BTL) OPTIME
TOPICAL | Status: DC | PRN
  Administered 2018-04-05: 1000 mL

## 2018-04-05 MED ORDER — ONDANSETRON HCL 4 MG/2ML IJ SOLN
INTRAMUSCULAR | Status: DC | PRN
  Administered 2018-04-05: 4 mg via INTRAVENOUS

## 2018-04-05 MED ORDER — PROPOFOL 10 MG/ML IV BOLUS
INTRAVENOUS | Status: AC
  Filled 2018-04-05: qty 20

## 2018-04-05 MED ORDER — FENTANYL CITRATE (PF) 250 MCG/5ML IJ SOLN
INTRAMUSCULAR | Status: AC
  Filled 2018-04-05: qty 5

## 2018-04-05 MED ORDER — DEXAMETHASONE SODIUM PHOSPHATE 10 MG/ML IJ SOLN
INTRAMUSCULAR | Status: AC
  Filled 2018-04-05: qty 1

## 2018-04-05 MED ORDER — SODIUM CHLORIDE 0.9 % IV SOLN
INTRAVENOUS | Status: AC
  Filled 2018-04-05: qty 1.2

## 2018-04-05 MED ORDER — FENTANYL CITRATE (PF) 250 MCG/5ML IJ SOLN
INTRAMUSCULAR | Status: DC | PRN
  Administered 2018-04-05: 25 ug via INTRAVENOUS

## 2018-04-05 MED ORDER — LIDOCAINE 2% (20 MG/ML) 5 ML SYRINGE
INTRAMUSCULAR | Status: AC
  Filled 2018-04-05: qty 10

## 2018-04-05 MED ORDER — LIDOCAINE HCL (CARDIAC) PF 100 MG/5ML IV SOSY
PREFILLED_SYRINGE | INTRAVENOUS | Status: DC | PRN
  Administered 2018-04-05: 20 mg via INTRAVENOUS

## 2018-04-05 MED ORDER — LIDOCAINE HCL (PF) 1 % IJ SOLN
INTRAMUSCULAR | Status: DC | PRN
  Administered 2018-04-05: 4 mL

## 2018-04-05 MED ORDER — ONDANSETRON HCL 4 MG/2ML IJ SOLN
INTRAMUSCULAR | Status: AC
  Filled 2018-04-05: qty 4

## 2018-04-05 MED ORDER — LIDOCAINE HCL (PF) 1 % IJ SOLN
INTRAMUSCULAR | Status: AC
  Filled 2018-04-05: qty 30

## 2018-04-05 SURGICAL SUPPLY — 38 items
ADH SKN CLS APL DERMABOND .7 (GAUZE/BANDAGES/DRESSINGS) ×1
AGENT HMST SPONGE THK3/8 (HEMOSTASIS)
ARMBAND PINK RESTRICT EXTREMIT (MISCELLANEOUS) ×3 IMPLANT
CANISTER SUCT 3000ML PPV (MISCELLANEOUS) ×3 IMPLANT
CLIP VESOCCLUDE MED 24/CT (CLIP) ×3 IMPLANT
CLIP VESOCCLUDE SM WIDE 24/CT (CLIP) ×3 IMPLANT
CORDS BIPOLAR (ELECTRODE) IMPLANT
COVER PROBE W GEL 5X96 (DRAPES) ×3 IMPLANT
DECANTER SPIKE VIAL GLASS SM (MISCELLANEOUS) ×1 IMPLANT
DERMABOND ADVANCED (GAUZE/BANDAGES/DRESSINGS) ×2
DERMABOND ADVANCED .7 DNX12 (GAUZE/BANDAGES/DRESSINGS) ×1 IMPLANT
DRAIN PENROSE 1/4X12 LTX STRL (WOUND CARE) ×2 IMPLANT
ELECT REM PT RETURN 9FT ADLT (ELECTROSURGICAL) ×3
ELECTRODE REM PT RTRN 9FT ADLT (ELECTROSURGICAL) ×1 IMPLANT
GLOVE BIO SURGEON STRL SZ7 (GLOVE) ×3 IMPLANT
GLOVE BIOGEL PI IND STRL 7.5 (GLOVE) ×1 IMPLANT
GLOVE BIOGEL PI INDICATOR 7.5 (GLOVE) ×2
GOWN STRL REUS W/ TWL LRG LVL3 (GOWN DISPOSABLE) ×3 IMPLANT
GOWN STRL REUS W/TWL LRG LVL3 (GOWN DISPOSABLE) ×9
HEMOSTAT SPONGE AVITENE ULTRA (HEMOSTASIS) IMPLANT
KIT BASIN OR (CUSTOM PROCEDURE TRAY) ×3 IMPLANT
KIT TURNOVER KIT B (KITS) ×3 IMPLANT
NDL HYPO 25GX1X1/2 BEV (NEEDLE) ×1 IMPLANT
NEEDLE HYPO 25GX1X1/2 BEV (NEEDLE) ×3 IMPLANT
NS IRRIG 1000ML POUR BTL (IV SOLUTION) ×3 IMPLANT
PACK CV ACCESS (CUSTOM PROCEDURE TRAY) ×3 IMPLANT
PAD ARMBOARD 7.5X6 YLW CONV (MISCELLANEOUS) ×6 IMPLANT
SUT MNCRL AB 4-0 PS2 18 (SUTURE) ×3 IMPLANT
SUT PROLENE 6 0 BV (SUTURE) ×3 IMPLANT
SUT PROLENE 7 0 BV 1 (SUTURE) ×4 IMPLANT
SUT SILK 2 0 SH (SUTURE) IMPLANT
SUT VIC AB 2-0 CT1 27 (SUTURE) ×3
SUT VIC AB 2-0 CT1 TAPERPNT 27 (SUTURE) ×1 IMPLANT
SUT VIC AB 3-0 SH 27 (SUTURE) ×6
SUT VIC AB 3-0 SH 27X BRD (SUTURE) ×2 IMPLANT
TOWEL GREEN STERILE (TOWEL DISPOSABLE) ×3 IMPLANT
UNDERPAD 30X30 (UNDERPADS AND DIAPERS) ×3 IMPLANT
WATER STERILE IRR 1000ML POUR (IV SOLUTION) ×3 IMPLANT

## 2018-04-05 NOTE — Op Note (Signed)
OPERATIVE NOTE   PROCEDURE: 1. right first stage brachial vein transposition (possible ulnobrachial arteriovenous fistula) placement  PRE-OPERATIVE DIAGNOSIS: chronic kidney disease stage IV-V   POST-OPERATIVE DIAGNOSIS: same as above   SURGEON: Adele Barthel, MD  ASSISTANT(S): PA Student  ANESTHESIA: local and MAC  ESTIMATED BLOOD LOSS: 50 cc  FINDINGS: 1.  Two smaller arteries adjacent to larger artery at level of the antecubitum: clamping larger artery did not appreciably change radial signal 2.  Brachial vein: 3 mm, acceptable 3.  Brachial artery: 3 mm, minimal disease 4.  Venous outflow: palpable thrill  5.  Radial flow: dopplerable radial signal  SPECIMEN(S):  none  INDICATIONS:   [name] is a 84 y.o. female who presents with chronic kidney disease stage IV-V.  The patient is scheduled for right first stage basilic vein transposition.  The patient is aware the risks include but are not limited to: bleeding, infection, steal syndrome, nerve damage, ischemic monomelic neuropathy, failure to mature, and need for additional procedures.  The patient is aware of the risks of the procedure and elects to proceed forward.  DESCRIPTION: After full informed written consent was obtained from the patient, the patient was brought back to the operating room and placed supine upon the operating table.  Prior to induction, the patient received IV antibiotics.   After obtaining adequate anesthesia, the patient was then prepped and draped in the standard fashion for a left arm access procedure.    I turned my attention first to identifying the patient's basilic and brachial vein and brachial artery.  Using SonoSite guidance, the location of these vessels were marked out on the skin.   At this point, I injected local anesthetic to obtain a field block of the antecubitum.  In total, I injected about 5 mL of 1% lidocaine without epinephrine.  I made a longitudinal incision at the level of  the antecubitum and dissected through the subcutaneous tissue and fascia to gain exposure of the brachial artery.  This was noted to be 3 mm in diameter externally.  There were two 1-1.5 mm smaller arteries adjacent to the presumed brachial artery.  I dopplered the right artery signal and then clamped the brachial artery.  No significant change in the radial signal was appreciated, suggest this is an ulnar artery. This was dissected out proximally and distally and controlled with vessel loops.  I briefly dissected out the basilic vein, but at this level the vein is inadequate for access.  I then dissected out the brachial vein.  This was noted to be 3 mm in diameter externally.  The distal segment of the vein was ligated with a  2-0 silk, and the vein was transected.  The proximal segment was interrogated with serial dilators.  The vein accepted up to a 3 mm dilator without any difficulty.  I then instilled the heparinized saline into the vein and clamped it.  At this point, I reset my exposure of the brachial artery and placed the artery under tension proximally and distally.  I made an arteriotomy with a #11 blade, and then I extended the arteriotomy with a Potts scissor.  I injected heparinized saline proximal and distal to this arteriotomy.  The vein was then sewn to the artery in an end-to-side configuration with a running stitch of 7-0 Prolene.  Prior to completing this anastomosis, I allowed the vein and artery to backbleed.  There was no evidence of clot from any vessels.  I completed the anastomosis in  the usual fashion and then released all vessel loops and clamps.    There was a palpable thrill in the venous outflow, and there was a dopplerable radial signal.   At this point, I irrigated out the surgical wound.  There was no further active bleeding.  The subcutaneous tissue was reapproximated with a running stitch of 3-0 Vicryl.  The skin was then reapproximated with a running subcuticular stitch of  4-0 Vicryl.  The skin was then cleaned, dried, and reinforced with Liquidband.  The patient tolerated this procedure well.    COMPLICATIONS: none  CONDITION: stable   Adele Barthel, MD, [facility] [place] [facility] Office: [phone] Pager: [phone]  04/05/2018, 8:49 AM

## 2018-04-05 NOTE — Anesthesia Postprocedure Evaluation (Signed)
Anesthesia Post Note  Patient: Briyanna Billingham  Procedure(s) Performed: RIGHT BRACHIAL VEIN TRANSPOSITION FIRST STAGE (Right Arm Upper)     Patient location during evaluation: PACU Anesthesia Type: MAC Level of consciousness: awake and alert Pain management: pain level controlled Vital Signs Assessment: post-procedure vital signs reviewed and stable Respiratory status: spontaneous breathing, nonlabored ventilation and respiratory function stable Cardiovascular status: stable and blood pressure returned to baseline Postop Assessment: no apparent nausea or vomiting Anesthetic complications: no    Last Vitals:  Vitals:   04/05/18 0925 04/05/18 0955  BP: (!) 138/54 (!) 147/57  Pulse: 66 65  Resp: (!) 28 (!) 22  Temp: 36.7 C   SpO2: 100% 100%    Last Pain:  Vitals:   04/05/18 0955  TempSrc:   PainSc: 0-No pain                 Lynda Rainwater

## 2018-04-05 NOTE — Anesthesia Procedure Notes (Signed)
Procedure Name: MAC Date/Time: 04/05/2018 7:44 AM Performed by: Glynda Jaeger, CRNA Pre-anesthesia Checklist: Patient identified, Emergency Drugs available, Suction available, Timeout performed and Patient being monitored Patient Re-evaluated:Patient Re-evaluated prior to induction Oxygen Delivery Method: Simple face mask Placement Confirmation: positive ETCO2

## 2018-04-05 NOTE — H&P (Signed)
Brief History and Physical  History of Present Illness   [name] is a 84 y.o. female who presents with chief complaint: chronic kidney disease stage 4.  The patient presents today for placement of right first basilic vein transposition.    Past Medical History:  Diagnosis Date  . Anxiety   . Asthma   . AVM (arteriovenous malformation) of colon   . CAD (coronary artery disease)   . Cervical polyp   . Chronic kidney disease   . Diverticulosis   . Esophageal stricture   . GERD (gastroesophageal reflux disease)   . Hiatal hernia   . Hypertension   . Iron deficiency anemia   . Osteoarthritis   . Seizures ([place])   . Tubular adenoma of colon 2014  . Ventricular hypertrophy     Past Surgical History:  Procedure Laterality Date  . ANGIOPLASTY  1999  . Bilateral Total Knee Replacements     Left Knee-12/29/2003, Right Knee-04/10/2002  . BREAST BIOPSY Right 2015  . BREAST EXCISIONAL BIOPSY Left   . BREAST SURGERY    . CARDIAC CATHETERIZATION    . CARPAL TUNNEL RELEASE    . CATARACT EXTRACTION    . COLONOSCOPY    . COLONOSCOPY W/ BIOPSIES    . CORONARY ANGIOPLASTY    . INGUINAL HERNIA REPAIR  08/08/2006   RIGHT INGUINAL HERNIA REPAIR WITH MESH  . JOINT REPLACEMENT    . KNEE ARTHROSCOPY    . LAPAROSCOPIC APPENDECTOMY    . LAPAROSCOPIC CHOLECYSTECTOMY    . MAJOR DUCT EXCISION OF LEFT BREAST  06/01/2004  . RIGHT SHOULDER ARTHROSCOPY  12/18/2006  . Right Total Hip Athroplasty  01/23/2002  . TONSILLECTOMY      Social History   Socioeconomic History  . Marital status: Widowed    Spouse name: Not on file  . Number of children: Not on file  . Years of education: Not on file  . Highest education level: Not on file  Occupational History  . Not on file  Social Needs  . Financial resource strain: Not on file  . Food insecurity:    Worry: Not on file    Inability: Not on file  . Transportation needs:    Medical: Not on file    Non-medical: Not on file  Tobacco Use  .  Smoking status: Former Smoker    Types: Cigarettes  . Smokeless tobacco: Never Used  Substance and Sexual Activity  . Alcohol use: No  . Drug use: No  . Sexual activity: Never  Lifestyle  . Physical activity:    Days per week: Not on file    Minutes per session: Not on file  . Stress: Not on file  Relationships  . Social connections:    Talks on phone: Not on file    Gets together: Not on file    Attends religious service: Not on file    Active member of club or organization: Not on file    Attends meetings of clubs or organizations: Not on file    Relationship status: Not on file  . Intimate partner violence:    Fear of current or ex partner: Not on file    Emotionally abused: Not on file    Physically abused: Not on file    Forced sexual activity: Not on file  Other Topics Concern  . Not on file  Social History Narrative  . Not on file    Family History  Problem Relation Age of Onset  .  Hypertension Mother   . Stroke Mother   . Cancer Father   . Cancer Sister   . Other Brother        brain tumor  . Breast cancer Paternal Aunt        in 42's or 45's    Current Facility-Administered Medications  Medication Dose Route Frequency Provider Last Rate Last Dose  . 0.9 %  sodium chloride infusion   Intravenous Continuous Conrad Simpson, MD      . vancomycin (VANCOCIN) IVPB 1000 mg/200 mL premix  1,000 mg Intravenous 60 min Pre-Op Conrad [place], MD        Allergies  Allergen Reactions  . Haldol [Haloperidol Decanoate] Other (See Comments)    Agitation, aggressive and combative  . Lorazepam Other (See Comments)    Agitation, lethargic  . Cephalexin Other (See Comments)    REACTION: Questionable, no reaction listed    Review of Systems: As listed above, otherwise negative.   Physical Examination   Vitals:   04/05/18 0557 04/05/18 0558  BP: (!) 159/38   Pulse: 73   Resp: 20   Temp: (!) 97.5 F (36.4 C)   TempSrc: Oral   SpO2: 99%   Weight:  139 lb (63 kg)    Height:  5' (1.524 m)   Body mass index is 27.15 kg/m.  General Alert, O x 3, WD, NAD  Pulmonary Sym exp, good B air movt, CTA B  Cardiac RRR, Nl S1, S2, no Murmurs, No rubs, No S3,S4  Musculo- skeletal Motor intact BUE, no sensory loss  Neurologic Pain and light touch intact in extremities,     Laboratory  See iStat   Medical Decision Making   [name] is a 84 y.o. female who presents with: chronic kidney disease stage IV.   The patient is scheduled for: R 1st stage BVT vs AVG  Risk, benefits, and alternatives to access surgery were discussed.  The patient is aware the risks include but are not limited to: bleeding, infection, steal syndrome, nerve damage, ischemic monomelic neuropathy, failure to mature, and need for additional procedures.  The patient is aware of the risks and agrees to proceed.   Adele Barthel, MD, FACS [facility] Office: [phone] Pager: [phone]  04/05/2018, 6:56 AM

## 2018-04-05 NOTE — Transfer of Care (Signed)
Immediate Anesthesia Transfer of Care Note  Patient: [name]  Procedure(s) Performed: RIGHT BRACHIAL VEIN TRANSPOSITION FIRST STAGE (Right Arm Upper)  Patient Location: PACU  Anesthesia Type:MAC  Level of Consciousness: awake, alert , oriented and patient cooperative  Airway & Oxygen Therapy: Patient Spontanous Breathing  Post-op Assessment: Report given to RN and Post -op Vital signs reviewed and stable  Post vital signs: Reviewed and stable  Last Vitals:  Vitals Value Taken Time  BP    Temp    Pulse    Resp    SpO2      Last Pain:  Vitals:   04/05/18 0711  TempSrc:   PainSc: 0-No pain         Complications: No apparent anesthesia complications

## 2018-04-05 NOTE — Anesthesia Preprocedure Evaluation (Addendum)
Anesthesia Evaluation  Patient identified by MRN, date of birth, ID band Patient awake    Reviewed: Allergy & Precautions, NPO status , Patient's Chart, lab work & pertinent test results  Airway Mallampati: II  TM Distance: >3 FB Neck ROM: Full    Dental no notable dental hx. (+) Edentulous Upper, Edentulous Lower   Pulmonary neg pulmonary ROS, former smoker,    Pulmonary exam normal breath sounds clear to auscultation       Cardiovascular hypertension, + CAD  negative cardio ROS Normal cardiovascular exam Rhythm:Regular Rate:Normal     Neuro/Psych Anxiety negative neurological ROS  negative psych ROS   GI/Hepatic negative GI ROS, Neg liver ROS, GERD  ,  Endo/Other  negative endocrine ROS  Renal/GU Renal Insufficiency and ESRFRenal diseasenegative Renal ROS  negative genitourinary   Musculoskeletal negative musculoskeletal ROS (+)   Abdominal   Peds negative pediatric ROS (+)  Hematology negative hematology ROS (+)   Anesthesia Other Findings   Reproductive/Obstetrics negative OB ROS                            Anesthesia Physical Anesthesia Plan  ASA: III  Anesthesia Plan: MAC   Post-op Pain Management:    Induction: Intravenous  PONV Risk Score and Plan: 2 and Ondansetron  Airway Management Planned: Simple Face Mask  Additional Equipment:   Intra-op Plan:   Post-operative Plan:   Informed Consent: I have reviewed the patients History and Physical, chart, labs and discussed the procedure including the risks, benefits and alternatives for the proposed anesthesia with the patient or authorized representative who has indicated his/her understanding and acceptance.   Dental advisory given  Plan Discussed with: CRNA  Anesthesia Plan Comments:         Anesthesia Quick Evaluation

## 2018-04-05 NOTE — Discharge Instructions (Signed)
° °  [facility]  Discharge Instructions  AV Fistula or Graft Surgery for Dialysis Access  Please refer to the following instructions for your post-procedure care. Your surgeon or physician assistant will discuss any changes with you.  Activity  You may drive the day following your surgery, if you are comfortable and no longer taking prescription pain medication. Resume full activity as the soreness in your incision resolves.  Bathing/Showering  You may shower after you go home. Keep your incision dry for 48 hours. Do not soak in a bathtub, hot tub, or swim until the incision heals completely. You may not shower if you have a hemodialysis catheter.  Incision Care  Clean your incision with mild soap and water after 48 hours. Pat the area dry with a clean towel. You do not need a bandage unless otherwise instructed. Do not apply any ointments or creams to your incision. You may have skin glue on your incision. Do not peel it off. It will come off on its own in about one week. Your arm may swell a bit after surgery. To reduce swelling use pillows to elevate your arm so it is above your heart. Your doctor will tell you if you need to lightly wrap your arm with an ACE bandage.  Diet  Resume your normal diet. There are not special food restrictions following this procedure. In order to heal from your surgery, it is CRITICAL to get adequate nutrition. Your body requires vitamins, minerals, and protein. Vegetables are the best source of vitamins and minerals. Vegetables also provide the perfect balance of protein. Processed food has little nutritional value, so try to avoid this.  Medications  Resume taking all of your medications. If your incision is causing pain, you may take over-the counter pain relievers such as acetaminophen (Tylenol). If you were prescribed a stronger pain medication, please be aware these medications can cause nausea and constipation. Prevent  nausea by taking the medication with a snack or meal. Avoid constipation by drinking plenty of fluids and eating foods with high amount of fiber, such as fruits, vegetables, and grains.  Do not take Tylenol if you are taking prescription pain medications.  Follow up Your surgeon may want to see you in the office following your access surgery. If so, this will be arranged at the time of your surgery.  Please call us immediately for any of the following conditions:  Increased pain, redness, drainage (pus) from your incision site Fever of 101 degrees or higher Severe or worsening pain at your incision site Hand pain or numbness.  Reduce your risk of vascular disease:  Stop smoking. If you would like help, call [name] at 1-800-QUIT-NOW 7734708577) or [name] at [place] your cholesterol Maintain a desired weight Control your diabetes Keep your blood pressure down  Dialysis  It will take several weeks to several months for your new dialysis access to be ready for use. Your surgeon will determine when it is okay to use it. Your nephrologist will continue to direct your dialysis. You can continue to use your Permcath until your new access is ready for use.   04/05/2018 [name] [id number] 05-29-1930  Surgeon(s): Selena [place], MD  Procedure(s): Right brachiobrachial fistula for dialysis access  x Do not stick fistula for 12 weeks    If you have any questions, please call the office at [phone].

## 2018-04-06 ENCOUNTER — Encounter (HOSPITAL_COMMUNITY): Payer: Self-pay | Admitting: Vascular Surgery

## 2018-04-08 ENCOUNTER — Telehealth: Payer: Self-pay | Admitting: Vascular Surgery

## 2018-04-08 NOTE — Telephone Encounter (Signed)
-----   Message from Mena Goes, RN sent at 04/06/2018  5:28 PM EDT ----- Regarding: 6 weeks with duplex   ----- Message ----- From: Marquis Lunch Sent: 04/05/2018   8:55 AM To: [facility] Charge Pool  S/p first stage right brachiobrachial AVF by Dr. Bridgett Larsson. Needs duplex and office visit in 6 weeks

## 2018-04-08 NOTE — Telephone Encounter (Signed)
-----   Message from Mena Goes, RN sent at 04/08/2018 11:04 AM EDT ----- Regarding: 6 weeks in PA clinic   ----- Message ----- From: Conrad Sheffield, MD Sent: 04/05/2018   8:54 AM To: [facility] Charge Pool  [name] [id number] September 11, 1930   PROCEDURE: 1. right first stage brachial vein transposition (possible ulnobrachial arteriovenous fistula) placement  F/U: 6 weeks with PA clinic

## 2018-04-08 NOTE — Telephone Encounter (Signed)
Sched appt 05/22/18; lab at 2:00 and MD at 2:45. Lm on cell# to inform pt of appt.

## 2018-04-08 NOTE — Telephone Encounter (Signed)
Pt already has 05/22/18 appt dulpex at 2:00 and MD at 2:45. Changed to PA at 3:00.

## 2018-04-09 ENCOUNTER — Other Ambulatory Visit: Payer: Self-pay

## 2018-04-09 DIAGNOSIS — N184 Chronic kidney disease, stage 4 (severe): Secondary | ICD-10-CM | POA: Diagnosis not present

## 2018-04-09 DIAGNOSIS — G40909 Epilepsy, unspecified, not intractable, without status epilepticus: Secondary | ICD-10-CM | POA: Diagnosis not present

## 2018-04-09 DIAGNOSIS — N185 Chronic kidney disease, stage 5: Secondary | ICD-10-CM

## 2018-04-09 DIAGNOSIS — I130 Hypertensive heart and chronic kidney disease with heart failure and stage 1 through stage 4 chronic kidney disease, or unspecified chronic kidney disease: Secondary | ICD-10-CM | POA: Diagnosis not present

## 2018-04-09 DIAGNOSIS — I70209 Unspecified atherosclerosis of native arteries of extremities, unspecified extremity: Secondary | ICD-10-CM | POA: Diagnosis not present

## 2018-04-09 DIAGNOSIS — N179 Acute kidney failure, unspecified: Secondary | ICD-10-CM

## 2018-04-09 DIAGNOSIS — N189 Chronic kidney disease, unspecified: Secondary | ICD-10-CM

## 2018-04-09 DIAGNOSIS — Z48812 Encounter for surgical aftercare following surgery on the circulatory system: Secondary | ICD-10-CM

## 2018-04-09 DIAGNOSIS — I5022 Chronic systolic (congestive) heart failure: Secondary | ICD-10-CM | POA: Diagnosis not present

## 2018-04-17 DIAGNOSIS — G40909 Epilepsy, unspecified, not intractable, without status epilepticus: Secondary | ICD-10-CM | POA: Diagnosis not present

## 2018-04-17 DIAGNOSIS — N184 Chronic kidney disease, stage 4 (severe): Secondary | ICD-10-CM | POA: Diagnosis not present

## 2018-04-17 DIAGNOSIS — I130 Hypertensive heart and chronic kidney disease with heart failure and stage 1 through stage 4 chronic kidney disease, or unspecified chronic kidney disease: Secondary | ICD-10-CM | POA: Diagnosis not present

## 2018-04-17 DIAGNOSIS — I70209 Unspecified atherosclerosis of native arteries of extremities, unspecified extremity: Secondary | ICD-10-CM | POA: Diagnosis not present

## 2018-04-17 DIAGNOSIS — I5022 Chronic systolic (congestive) heart failure: Secondary | ICD-10-CM | POA: Diagnosis not present

## 2018-04-26 DIAGNOSIS — G40909 Epilepsy, unspecified, not intractable, without status epilepticus: Secondary | ICD-10-CM | POA: Diagnosis not present

## 2018-04-26 DIAGNOSIS — N184 Chronic kidney disease, stage 4 (severe): Secondary | ICD-10-CM | POA: Diagnosis not present

## 2018-04-26 DIAGNOSIS — I70209 Unspecified atherosclerosis of native arteries of extremities, unspecified extremity: Secondary | ICD-10-CM | POA: Diagnosis not present

## 2018-04-26 DIAGNOSIS — I130 Hypertensive heart and chronic kidney disease with heart failure and stage 1 through stage 4 chronic kidney disease, or unspecified chronic kidney disease: Secondary | ICD-10-CM | POA: Diagnosis not present

## 2018-04-26 DIAGNOSIS — I5022 Chronic systolic (congestive) heart failure: Secondary | ICD-10-CM | POA: Diagnosis not present

## 2018-04-29 DIAGNOSIS — I70209 Unspecified atherosclerosis of native arteries of extremities, unspecified extremity: Secondary | ICD-10-CM | POA: Diagnosis not present

## 2018-04-29 DIAGNOSIS — N184 Chronic kidney disease, stage 4 (severe): Secondary | ICD-10-CM | POA: Diagnosis not present

## 2018-04-29 DIAGNOSIS — G40909 Epilepsy, unspecified, not intractable, without status epilepticus: Secondary | ICD-10-CM | POA: Diagnosis not present

## 2018-04-29 DIAGNOSIS — I130 Hypertensive heart and chronic kidney disease with heart failure and stage 1 through stage 4 chronic kidney disease, or unspecified chronic kidney disease: Secondary | ICD-10-CM | POA: Diagnosis not present

## 2018-04-29 DIAGNOSIS — I5022 Chronic systolic (congestive) heart failure: Secondary | ICD-10-CM | POA: Diagnosis not present

## 2018-05-08 DIAGNOSIS — I130 Hypertensive heart and chronic kidney disease with heart failure and stage 1 through stage 4 chronic kidney disease, or unspecified chronic kidney disease: Secondary | ICD-10-CM | POA: Diagnosis not present

## 2018-05-08 DIAGNOSIS — G40909 Epilepsy, unspecified, not intractable, without status epilepticus: Secondary | ICD-10-CM | POA: Diagnosis not present

## 2018-05-08 DIAGNOSIS — I70209 Unspecified atherosclerosis of native arteries of extremities, unspecified extremity: Secondary | ICD-10-CM | POA: Diagnosis not present

## 2018-05-08 DIAGNOSIS — I5032 Chronic diastolic (congestive) heart failure: Secondary | ICD-10-CM | POA: Diagnosis not present

## 2018-05-08 DIAGNOSIS — I5022 Chronic systolic (congestive) heart failure: Secondary | ICD-10-CM | POA: Diagnosis not present

## 2018-05-08 DIAGNOSIS — E785 Hyperlipidemia, unspecified: Secondary | ICD-10-CM | POA: Diagnosis not present

## 2018-05-08 DIAGNOSIS — N184 Chronic kidney disease, stage 4 (severe): Secondary | ICD-10-CM | POA: Diagnosis not present

## 2018-05-08 DIAGNOSIS — I120 Hypertensive chronic kidney disease with stage 5 chronic kidney disease or end stage renal disease: Secondary | ICD-10-CM | POA: Diagnosis not present

## 2018-05-12 DIAGNOSIS — I70209 Unspecified atherosclerosis of native arteries of extremities, unspecified extremity: Secondary | ICD-10-CM | POA: Diagnosis not present

## 2018-05-12 DIAGNOSIS — N184 Chronic kidney disease, stage 4 (severe): Secondary | ICD-10-CM | POA: Diagnosis not present

## 2018-05-12 DIAGNOSIS — I5022 Chronic systolic (congestive) heart failure: Secondary | ICD-10-CM | POA: Diagnosis not present

## 2018-05-12 DIAGNOSIS — G40909 Epilepsy, unspecified, not intractable, without status epilepticus: Secondary | ICD-10-CM | POA: Diagnosis not present

## 2018-05-12 DIAGNOSIS — I130 Hypertensive heart and chronic kidney disease with heart failure and stage 1 through stage 4 chronic kidney disease, or unspecified chronic kidney disease: Secondary | ICD-10-CM | POA: Diagnosis not present

## 2018-05-13 DIAGNOSIS — I130 Hypertensive heart and chronic kidney disease with heart failure and stage 1 through stage 4 chronic kidney disease, or unspecified chronic kidney disease: Secondary | ICD-10-CM | POA: Diagnosis not present

## 2018-05-13 DIAGNOSIS — I5022 Chronic systolic (congestive) heart failure: Secondary | ICD-10-CM | POA: Diagnosis not present

## 2018-05-13 DIAGNOSIS — N184 Chronic kidney disease, stage 4 (severe): Secondary | ICD-10-CM | POA: Diagnosis not present

## 2018-05-13 DIAGNOSIS — I70209 Unspecified atherosclerosis of native arteries of extremities, unspecified extremity: Secondary | ICD-10-CM | POA: Diagnosis not present

## 2018-05-13 DIAGNOSIS — G40909 Epilepsy, unspecified, not intractable, without status epilepticus: Secondary | ICD-10-CM | POA: Diagnosis not present

## 2018-05-21 DIAGNOSIS — I130 Hypertensive heart and chronic kidney disease with heart failure and stage 1 through stage 4 chronic kidney disease, or unspecified chronic kidney disease: Secondary | ICD-10-CM | POA: Diagnosis not present

## 2018-05-21 DIAGNOSIS — G40909 Epilepsy, unspecified, not intractable, without status epilepticus: Secondary | ICD-10-CM | POA: Diagnosis not present

## 2018-05-21 DIAGNOSIS — I5022 Chronic systolic (congestive) heart failure: Secondary | ICD-10-CM | POA: Diagnosis not present

## 2018-05-21 DIAGNOSIS — I70209 Unspecified atherosclerosis of native arteries of extremities, unspecified extremity: Secondary | ICD-10-CM | POA: Diagnosis not present

## 2018-05-21 DIAGNOSIS — N184 Chronic kidney disease, stage 4 (severe): Secondary | ICD-10-CM | POA: Diagnosis not present

## 2018-05-22 ENCOUNTER — Other Ambulatory Visit: Payer: Self-pay

## 2018-05-22 ENCOUNTER — Ambulatory Visit (INDEPENDENT_AMBULATORY_CARE_PROVIDER_SITE_OTHER): Payer: Self-pay | Admitting: Physician Assistant

## 2018-05-22 ENCOUNTER — Ambulatory Visit (HOSPITAL_COMMUNITY)
Admission: RE | Admit: 2018-05-22 | Discharge: 2018-05-22 | Disposition: A | Discharge status: Home / Self Care | Payer: Medicare Other | Source: Ambulatory Visit | Attending: Vascular Surgery | Admitting: Vascular Surgery

## 2018-05-22 ENCOUNTER — Encounter: Payer: Self-pay | Admitting: Physician Assistant

## 2018-05-22 VITALS — BP 182/74 | HR 67 | Temp 97.0°F | Resp 16 | Ht 60.0 in | Wt 140.0 lb

## 2018-05-22 DIAGNOSIS — N179 Acute kidney failure, unspecified: Secondary | ICD-10-CM | POA: Diagnosis not present

## 2018-05-22 DIAGNOSIS — N189 Chronic kidney disease, unspecified: Secondary | ICD-10-CM | POA: Diagnosis not present

## 2018-05-22 DIAGNOSIS — N185 Chronic kidney disease, stage 5: Secondary | ICD-10-CM

## 2018-05-22 DIAGNOSIS — Z48812 Encounter for surgical aftercare following surgery on the circulatory system: Secondary | ICD-10-CM | POA: Diagnosis not present

## 2018-05-22 DIAGNOSIS — Z9889 Other specified postprocedural states: Secondary | ICD-10-CM | POA: Diagnosis present

## 2018-05-22 NOTE — Progress Notes (Signed)
    Postoperative Access Visit   History of Present Illness   [name] is a 84 y.o. year old female who presents for postoperative follow-up for: right first stage basilic vein transposition by Dr. Bridgett Larsson (Date: 04/05/18).  The patient's wounds are healed.  The patient denies steal symptoms.  The patient is able to complete their activities of daily living.  She is not yet on dialysis.  She follows up with her Nephrologist for management on CKD at 3 month intervals currently.  Physical Examination   Vitals:   05/22/18 1451  BP: (!) 182/74  Pulse: 67  Resp: 16  Temp: (!) 97 F (36.1 C)  TempSrc: Oral  SpO2: 96%  Weight: 140 lb (63.5 kg)  Height: 5' (1.524 m)   Body mass index is 27.34 kg/m.  right arm Incision is healed, hand grip is 5/5, sensation in digits is intact, palpable thrill near AC fossa, bruit can be auscultated; palpable R radial pulse   Fistula duplex 0.4cm diameter near AC fossa 0.3cm mid UA 0.35cm proximal UA   Medical Decision Making   [name] is a 84 y.o. year old female who presents s/p right first stage basilic vein transposition; patient not yet requiring hemodialysis   Based on fistula duplex and physical exam, the fistula is not ready for second stage transposition  Recheck fistula duplex in 1 month to assess readiness of fistula for transposition   Dagoberto Ligas, PA-C [facility] [place] Office: [phone]

## 2018-05-23 ENCOUNTER — Other Ambulatory Visit: Payer: Self-pay

## 2018-05-23 DIAGNOSIS — N185 Chronic kidney disease, stage 5: Secondary | ICD-10-CM

## 2018-05-27 DIAGNOSIS — G40909 Epilepsy, unspecified, not intractable, without status epilepticus: Secondary | ICD-10-CM | POA: Diagnosis not present

## 2018-05-27 DIAGNOSIS — I70209 Unspecified atherosclerosis of native arteries of extremities, unspecified extremity: Secondary | ICD-10-CM | POA: Diagnosis not present

## 2018-05-27 DIAGNOSIS — N184 Chronic kidney disease, stage 4 (severe): Secondary | ICD-10-CM | POA: Diagnosis not present

## 2018-05-27 DIAGNOSIS — I130 Hypertensive heart and chronic kidney disease with heart failure and stage 1 through stage 4 chronic kidney disease, or unspecified chronic kidney disease: Secondary | ICD-10-CM | POA: Diagnosis not present

## 2018-05-27 DIAGNOSIS — I5022 Chronic systolic (congestive) heart failure: Secondary | ICD-10-CM | POA: Diagnosis not present

## 2018-05-28 DIAGNOSIS — I120 Hypertensive chronic kidney disease with stage 5 chronic kidney disease or end stage renal disease: Secondary | ICD-10-CM | POA: Diagnosis not present

## 2018-05-28 DIAGNOSIS — N261 Atrophy of kidney (terminal): Secondary | ICD-10-CM | POA: Diagnosis not present

## 2018-05-28 DIAGNOSIS — N185 Chronic kidney disease, stage 5: Secondary | ICD-10-CM | POA: Diagnosis not present

## 2018-05-28 DIAGNOSIS — I701 Atherosclerosis of renal artery: Secondary | ICD-10-CM | POA: Diagnosis not present

## 2018-05-28 DIAGNOSIS — N2581 Secondary hyperparathyroidism of renal origin: Secondary | ICD-10-CM | POA: Diagnosis not present

## 2018-05-28 DIAGNOSIS — I770 Arteriovenous fistula, acquired: Secondary | ICD-10-CM | POA: Diagnosis not present

## 2018-06-03 DIAGNOSIS — N184 Chronic kidney disease, stage 4 (severe): Secondary | ICD-10-CM | POA: Diagnosis not present

## 2018-06-03 DIAGNOSIS — I70209 Unspecified atherosclerosis of native arteries of extremities, unspecified extremity: Secondary | ICD-10-CM | POA: Diagnosis not present

## 2018-06-03 DIAGNOSIS — I5022 Chronic systolic (congestive) heart failure: Secondary | ICD-10-CM | POA: Diagnosis not present

## 2018-06-03 DIAGNOSIS — G40909 Epilepsy, unspecified, not intractable, without status epilepticus: Secondary | ICD-10-CM | POA: Diagnosis not present

## 2018-06-03 DIAGNOSIS — I130 Hypertensive heart and chronic kidney disease with heart failure and stage 1 through stage 4 chronic kidney disease, or unspecified chronic kidney disease: Secondary | ICD-10-CM | POA: Diagnosis not present

## 2018-06-10 DIAGNOSIS — G40909 Epilepsy, unspecified, not intractable, without status epilepticus: Secondary | ICD-10-CM | POA: Diagnosis not present

## 2018-06-10 DIAGNOSIS — I70209 Unspecified atherosclerosis of native arteries of extremities, unspecified extremity: Secondary | ICD-10-CM | POA: Diagnosis not present

## 2018-06-10 DIAGNOSIS — I130 Hypertensive heart and chronic kidney disease with heart failure and stage 1 through stage 4 chronic kidney disease, or unspecified chronic kidney disease: Secondary | ICD-10-CM | POA: Diagnosis not present

## 2018-06-10 DIAGNOSIS — N184 Chronic kidney disease, stage 4 (severe): Secondary | ICD-10-CM | POA: Diagnosis not present

## 2018-06-10 DIAGNOSIS — I5022 Chronic systolic (congestive) heart failure: Secondary | ICD-10-CM | POA: Diagnosis not present

## 2018-06-19 DIAGNOSIS — I120 Hypertensive chronic kidney disease with stage 5 chronic kidney disease or end stage renal disease: Secondary | ICD-10-CM | POA: Diagnosis not present

## 2018-06-19 DIAGNOSIS — E785 Hyperlipidemia, unspecified: Secondary | ICD-10-CM | POA: Diagnosis not present

## 2018-06-19 DIAGNOSIS — N185 Chronic kidney disease, stage 5: Secondary | ICD-10-CM | POA: Diagnosis not present

## 2018-06-20 DIAGNOSIS — I70209 Unspecified atherosclerosis of native arteries of extremities, unspecified extremity: Secondary | ICD-10-CM | POA: Diagnosis not present

## 2018-06-20 DIAGNOSIS — I130 Hypertensive heart and chronic kidney disease with heart failure and stage 1 through stage 4 chronic kidney disease, or unspecified chronic kidney disease: Secondary | ICD-10-CM | POA: Diagnosis not present

## 2018-06-20 DIAGNOSIS — G40909 Epilepsy, unspecified, not intractable, without status epilepticus: Secondary | ICD-10-CM | POA: Diagnosis not present

## 2018-06-20 DIAGNOSIS — I5022 Chronic systolic (congestive) heart failure: Secondary | ICD-10-CM | POA: Diagnosis not present

## 2018-06-20 DIAGNOSIS — N184 Chronic kidney disease, stage 4 (severe): Secondary | ICD-10-CM | POA: Diagnosis not present

## 2018-06-25 DIAGNOSIS — N184 Chronic kidney disease, stage 4 (severe): Secondary | ICD-10-CM | POA: Diagnosis not present

## 2018-06-25 DIAGNOSIS — G40909 Epilepsy, unspecified, not intractable, without status epilepticus: Secondary | ICD-10-CM | POA: Diagnosis not present

## 2018-06-25 DIAGNOSIS — I70209 Unspecified atherosclerosis of native arteries of extremities, unspecified extremity: Secondary | ICD-10-CM | POA: Diagnosis not present

## 2018-06-25 DIAGNOSIS — I130 Hypertensive heart and chronic kidney disease with heart failure and stage 1 through stage 4 chronic kidney disease, or unspecified chronic kidney disease: Secondary | ICD-10-CM | POA: Diagnosis not present

## 2018-06-25 DIAGNOSIS — I5022 Chronic systolic (congestive) heart failure: Secondary | ICD-10-CM | POA: Diagnosis not present

## 2018-07-02 DIAGNOSIS — Z961 Presence of intraocular lens: Secondary | ICD-10-CM | POA: Diagnosis not present

## 2018-07-02 DIAGNOSIS — H43813 Vitreous degeneration, bilateral: Secondary | ICD-10-CM | POA: Diagnosis not present

## 2018-07-02 DIAGNOSIS — H04123 Dry eye syndrome of bilateral lacrimal glands: Secondary | ICD-10-CM | POA: Diagnosis not present

## 2018-07-04 ENCOUNTER — Other Ambulatory Visit: Payer: Self-pay | Admitting: *Deleted

## 2018-07-04 ENCOUNTER — Ambulatory Visit (HOSPITAL_COMMUNITY)
Admission: RE | Admit: 2018-07-04 | Discharge: 2018-07-04 | Disposition: A | Discharge status: Home / Self Care | Payer: Medicare Other | Source: Ambulatory Visit | Attending: Vascular Surgery | Admitting: Vascular Surgery

## 2018-07-04 ENCOUNTER — Ambulatory Visit (INDEPENDENT_AMBULATORY_CARE_PROVIDER_SITE_OTHER): Payer: Medicare Other | Admitting: Physician Assistant

## 2018-07-04 ENCOUNTER — Other Ambulatory Visit: Payer: Self-pay

## 2018-07-04 VITALS — BP 184/72 | HR 74 | Resp 18 | Ht 60.0 in | Wt 139.0 lb

## 2018-07-04 DIAGNOSIS — N185 Chronic kidney disease, stage 5: Secondary | ICD-10-CM

## 2018-07-04 NOTE — H&P (View-Only) (Signed)
      CC:  F/u for surgery   HPI: [name] is a 84 y.o. year old female who presents for postoperative follow-up for: right first stage basilic vein transposition by Dr. Bridgett Larsson (Date: 04/05/18).  Based on fistula duplex and physical exam on 05/22/2018, the fistula is not ready for second stage transposition.  The diameter is less than 4 mm.  She was scheduled for a f/u visit to re- evaluate the fistula.  She is not yet on HD.  She denise right UE pain, numbness and loss of motor.       Allergies  Allergen Reactions  . Haldol [Haloperidol Decanoate] Other (See Comments)    Agitation, aggressive and combative  . Lorazepam Other (See Comments)    Agitation, lethargic  . Cephalexin Other (See Comments)    REACTION: Questionable, no reaction listed    Current Outpatient Medications  Medication Sig Dispense Refill  . amLODipine (NORVASC) 10 MG tablet TAKE 1 TABLET(10 MG) BY MOUTH DAILY 30 tablet 11  . Carboxymethylcellul-Glycerin (LUBRICATING EYE DROPS OP) Place 1 drop into both eyes daily as needed (dry eyes).    . clopidogrel (PLAVIX) 75 MG tablet Take 75 mg by mouth daily.     Marland Kitchen doxazosin (CARDURA) 8 MG tablet TAKE 1 TABLET(8 MG) BY MOUTH DAILY 30 tablet 0  . furosemide (LASIX) 80 MG tablet Take 80 mg by mouth daily.    . hydrALAZINE (APRESOLINE) 25 MG tablet Take 25 mg by mouth 3 (three) times daily.    . hydrALAZINE (APRESOLINE) 50 MG tablet Take 1 tablet (50 mg total) by mouth 3 (three) times daily. 90 tablet 0  . levETIRAcetam (KEPPRA) 500 MG tablet Take 500 mg by mouth 2 (two) times daily.    . potassium chloride (K-DUR,KLOR-CON) 10 MEQ tablet Take 20 mEq by mouth 2 (two) times daily.     No current facility-administered medications for this visit.      ROS:  See HPI  Physical Exam:  Vitals:   07/04/18 1413 07/04/18 1414  BP: (!) 187/70 (!) 184/72  Pulse: 74   Resp: 18   SpO2: 96%     Incision:  Well healed left anti cubital incision Extremities:  Sensation  intact, grip 5/5 with palpable radial artey.  Palpable thrill throughout the fistula. Heart RRR with known murmur  Abdomen:  Soft, NTTP, + BS  Fistula duplex shows an increase in size of the fistula to 5 mm.  Assessment/Plan:  This is a 84 y.o. female who is s/p: right first stage basilic vein. She is not yet on HD.  I feel that the fistula is mature enough at this time to move forward with second stage transposition surgery.  It is likely that once it is moved and branches are legated it will mature to > 6 mm in size.    She will be placed on the schedule next week.      Roxy Horseman PA-C [facility] [phone]

## 2018-07-04 NOTE — Progress Notes (Signed)
ALL Pre-op instruction given to daughter. To be at [facility] admitting at 6:45 am for surgery on 07/15/18 instructed to hold Plavix x 5 days pre-op and NPO pas MN night prior . Follow instructions received from the hospital pre-admission department.

## 2018-07-04 NOTE — Progress Notes (Signed)
      CC:  F/u for surgery   HPI: [name] is a 84 y.o. year old female who presents for postoperative follow-up for: right first stage basilic vein transposition by Dr. Bridgett Larsson (Date: 04/05/18).  Based on fistula duplex and physical exam on 05/22/2018, the fistula is not ready for second stage transposition.  The diameter is less than 4 mm.  She was scheduled for a f/u visit to re- evaluate the fistula.  She is not yet on HD.  She denise right UE pain, numbness and loss of motor.       Allergies  Allergen Reactions  . Haldol [Haloperidol Decanoate] Other (See Comments)    Agitation, aggressive and combative  . Lorazepam Other (See Comments)    Agitation, lethargic  . Cephalexin Other (See Comments)    REACTION: Questionable, no reaction listed    Current Outpatient Medications  Medication Sig Dispense Refill  . amLODipine (NORVASC) 10 MG tablet TAKE 1 TABLET(10 MG) BY MOUTH DAILY 30 tablet 11  . Carboxymethylcellul-Glycerin (LUBRICATING EYE DROPS OP) Place 1 drop into both eyes daily as needed (dry eyes).    . clopidogrel (PLAVIX) 75 MG tablet Take 75 mg by mouth daily.     Marland Kitchen doxazosin (CARDURA) 8 MG tablet TAKE 1 TABLET(8 MG) BY MOUTH DAILY 30 tablet 0  . furosemide (LASIX) 80 MG tablet Take 80 mg by mouth daily.    . hydrALAZINE (APRESOLINE) 25 MG tablet Take 25 mg by mouth 3 (three) times daily.    . hydrALAZINE (APRESOLINE) 50 MG tablet Take 1 tablet (50 mg total) by mouth 3 (three) times daily. 90 tablet 0  . levETIRAcetam (KEPPRA) 500 MG tablet Take 500 mg by mouth 2 (two) times daily.    . potassium chloride (K-DUR,KLOR-CON) 10 MEQ tablet Take 20 mEq by mouth 2 (two) times daily.     No current facility-administered medications for this visit.      ROS:  See HPI  Physical Exam:  Vitals:   07/04/18 1413 07/04/18 1414  BP: (!) 187/70 (!) 184/72  Pulse: 74   Resp: 18   SpO2: 96%     Incision:  Well healed left anti cubital incision Extremities:  Sensation  intact, grip 5/5 with palpable radial artey.  Palpable thrill throughout the fistula. Heart RRR with known murmur  Abdomen:  Soft, NTTP, + BS  Fistula duplex shows an increase in size of the fistula to 5 mm.  Assessment/Plan:  This is a 84 y.o. female who is s/p: right first stage basilic vein. She is not yet on HD.  I feel that the fistula is mature enough at this time to move forward with second stage transposition surgery.  It is likely that once it is moved and branches are legated it will mature to > 6 mm in size.    She will be placed on the schedule next week.      Roxy Horseman PA-C [facility] [phone]

## 2018-07-08 ENCOUNTER — Encounter: Payer: Self-pay | Admitting: Cardiovascular Disease

## 2018-07-08 ENCOUNTER — Ambulatory Visit (INDEPENDENT_AMBULATORY_CARE_PROVIDER_SITE_OTHER): Payer: Medicare Other | Admitting: Cardiovascular Disease

## 2018-07-08 VITALS — BP 140/54 | HR 71 | Ht <= 58 in | Wt 139.8 lb

## 2018-07-08 DIAGNOSIS — I5032 Chronic diastolic (congestive) heart failure: Secondary | ICD-10-CM | POA: Diagnosis not present

## 2018-07-08 DIAGNOSIS — I10 Essential (primary) hypertension: Secondary | ICD-10-CM | POA: Diagnosis not present

## 2018-07-08 LAB — BASIC METABOLIC PANEL
BUN/Creatinine Ratio: 11 — ABNORMAL LOW (ref 12–28)
BUN: 36 mg/dL — AB (ref 8–27)
CALCIUM: 9.6 mg/dL (ref 8.7–10.3)
CO2: 17 mmol/L — AB (ref 20–29)
CREATININE: 3.42 mg/dL — AB (ref 0.57–1.00)
Chloride: 104 mmol/L (ref 96–106)
GFR, EST AFRICAN AMERICAN: 13 mL/min/{1.73_m2} — AB (ref >59)
GFR, EST NON AFRICAN AMERICAN: 12 mL/min/{1.73_m2} — AB (ref >59)
Glucose: 118 mg/dL — ABNORMAL HIGH (ref 65–99)
POTASSIUM: 3.6 mmol/L (ref 3.5–5.2)
Sodium: 142 mmol/L (ref 134–144)

## 2018-07-08 NOTE — Patient Instructions (Signed)
Medication Instructions:  Your physician recommends that you continue on your current medications as directed. Please refer to the Current Medication list given to you today.   Labwork: TODAY - basic metabolic panel   Testing/Procedures: None Ordered   Follow-Up: Your physician wants you to follow-up in: 6 months with [name]. You will receive a reminder letter in the mail two months in advance. If you don't receive a letter, please call our office to schedule the follow-up appointment.   If you need a refill on your cardiac medications before your next appointment, please call your pharmacy.   Thank you for choosing [facility]! Christen Bame, RN [phone]

## 2018-07-08 NOTE — Progress Notes (Signed)
Cardiology Office Note   Date:  07/08/2018   ID:  [name], DOB 09-Apr-1930, MRN [id number]  PCP:  Lucianne Lei, MD  Cardiologist:   Mertie Moores, MD   Chief Complaint  Patient presents with  . Hypertension   1. LBBB 2. Diastolic dysfunction 3. Anemia -    4. HTN 5, PVD - renal artery stenosis 6. CKD    Previous notes  [name] is seen back today for a 10 day check. She has a chronic LBBB, remote stress testing in 2005 which was normal, HTN, PVD with past renal artery stenosis, PAF (but with no real documentation of this apparently), iron deficiency anemia, seizure disorder and prior subdural hematoma and a past GI bleed. She has had chronic chest pain and palpitations for many years. Echo back in 2010 showed normal LV function, mild LVH with mild hypertrophy and moderate obstruction of the outflow track.   I saw her earlier this month. BP was quite high. We have adjusted her medicines and updated her echo. She has severe LVH and is felt to have diastolic dysfunction. Hydralazine was increased. She had not taken any of her medicines prior to her last visit with me.   She comes back today. She is here alone. Doing ok. BP still high here today. She brought her cuff in to check. Her readings in the memory are reviewed and for the most part look good. She feels ok and actually says she feels better. Riding her bike at home.   June 23, 2013:  [name] was last seen by me in 2011. She was seen by [name] in March 2014. She is breathing OK. She continues to have problems with anemia and is breathing better since she had her transfusion. No CP . She has white coat HTN. She does not add salt.   She was accompanied by family.   March 16, 2014:  [name] presents for follow up . BP was elevated here but it was high here. BP readings are ok at home.   Oct. 6. 2015:  April 07, 2015: BP is a bit high. Has her son and his daughter living with her ( getting his house  renovated )  Takes her meds regularly,  Sept. 14, 2016:  BP is very elevated this am  Is under stress - has a nephew who is not expected to live much longer (lung cancer)   Oct. 28, 2016:  Doing well.  We added Aldactone at her last visit.  She is feeling much better.  Has some fatigue with walking up stairs.  BP looks great . Avoiding salt .  April 06, 2016:  [name] is seen back for a visit. Was hospitalized with acute renal failure and hyperkalemia. Aldactone and ARB have been held .  Feeling better.  BP is well controlled.   Oct. 24. 2017  BP is up this am  Taking her meds. , has been well.  She is concerned about her HR going up and down . No syncope  February 19, 2017:  [name] is seen today for follow-up of her hypertension BP is ok No CP , no dyspnea.   July 08, 2018: Doing well from a cardiac standpoint. Has a dialysis fistula in her right upper arm    Past Medical History:  Diagnosis Date  . Anxiety   . Asthma   . AVM (arteriovenous malformation) of colon   . CAD (coronary artery disease)   . Cervical polyp   . Chronic kidney  disease   . Diverticulosis   . Esophageal stricture   . GERD (gastroesophageal reflux disease)   . Hiatal hernia   . Hypertension   . Iron deficiency anemia   . Osteoarthritis   . Seizures ([place])   . Tubular adenoma of colon 2014  . Ventricular hypertrophy     Past Surgical History:  Procedure Laterality Date  . ANGIOPLASTY  1999  . BASCILIC VEIN TRANSPOSITION Right 04/05/2018   Procedure: RIGHT BRACHIAL VEIN TRANSPOSITION FIRST STAGE;  Surgeon: Conrad Homeacre-Lyndora, MD;  Location: [facility];  Service: Vascular;  Laterality: Right;  . Bilateral Total Knee Replacements     Left Knee-12/29/2003, Right Knee-04/10/2002  . BREAST BIOPSY Right 2015  . BREAST EXCISIONAL BIOPSY Left   . BREAST SURGERY    . CARDIAC CATHETERIZATION    . CARPAL TUNNEL RELEASE    . CATARACT EXTRACTION    . COLONOSCOPY    . COLONOSCOPY W/ BIOPSIES    . CORONARY  ANGIOPLASTY    . INGUINAL HERNIA REPAIR  08/08/2006   RIGHT INGUINAL HERNIA REPAIR WITH MESH  . JOINT REPLACEMENT    . KNEE ARTHROSCOPY    . LAPAROSCOPIC APPENDECTOMY    . LAPAROSCOPIC CHOLECYSTECTOMY    . MAJOR DUCT EXCISION OF LEFT BREAST  06/01/2004  . RIGHT SHOULDER ARTHROSCOPY  12/18/2006  . Right Total Hip Athroplasty  01/23/2002  . TONSILLECTOMY       Current Outpatient Medications  Medication Sig Dispense Refill  . amLODipine (NORVASC) 10 MG tablet TAKE 1 TABLET(10 MG) BY MOUTH DAILY 30 tablet 11  . Carboxymethylcellul-Glycerin (LUBRICATING EYE DROPS OP) Place 1 drop into both eyes daily as needed (dry eyes).    . clopidogrel (PLAVIX) 75 MG tablet Take 75 mg by mouth daily.     Marland Kitchen doxazosin (CARDURA) 8 MG tablet TAKE 1 TABLET(8 MG) BY MOUTH DAILY 30 tablet 0  . furosemide (LASIX) 80 MG tablet Take 80 mg by mouth daily.    . hydrALAZINE (APRESOLINE) 25 MG tablet Take 25 mg by mouth 3 (three) times daily.    . hydrALAZINE (APRESOLINE) 50 MG tablet Take 1 tablet (50 mg total) by mouth 3 (three) times daily. 90 tablet 0  . levETIRAcetam (KEPPRA) 500 MG tablet Take 500 mg by mouth 2 (two) times daily.    . potassium chloride (K-DUR,KLOR-CON) 10 MEQ tablet Take 20 mEq by mouth 2 (two) times daily.     No current facility-administered medications for this visit.     Allergies:   Haldol [haloperidol decanoate]; Lorazepam; and Cephalexin    Social History:  The patient  reports that she has quit smoking. Her smoking use included cigarettes. She has never used smokeless tobacco. She reports that she does not drink alcohol or use drugs.   Family History:  The patient's family history includes Breast cancer in her paternal aunt; Cancer in her father and sister; Hypertension in her mother; Other in her brother; Stroke in her mother.    ROS:   Noted in current history, otherwise review of systems is negative.   Physical Exam: Blood pressure (!) 140/54, pulse 71, height 4\' 7"  (1.397 m),  weight 139 lb 12.8 oz (63.4 kg), SpO2 94 %.  GEN: Elderly female, no acute distress HEENT: Normal NECK: No JVD; No carotid bruits LYMPHATICS: No lymphadenopathy CARDIAC: RRR, soft systolic murmur. RESPIRATORY:  Clear to auscultation without rales, wheezing or rhonchi  ABDOMEN: Soft, non-tender, non-distended MUSCULOSKELETAL:   Right upper arm dialysis fistula in place. + thrill  and bruit.  SKIN: Warm and dry NEUROLOGIC:  Alert and oriented x 3   EKG:    .   Recent Labs: 11/05/2017: TSH 1.657 11/06/2017: ALT 11 11/08/2017: Platelets 197 11/09/2017: BUN 52; Creatinine, Ser 4.32 04/05/2018: Hemoglobin 10.2; Potassium 5.1; Sodium 139    Lipid Panel    Component Value Date/Time   CHOL 228 (H) 09/11/2014 0850   TRIG 211.0 (H) 09/11/2014 0850   HDL 41.50 09/11/2014 0850   CHOLHDL 5 09/11/2014 0850   VLDL 42.2 (H) 09/11/2014 0850   LDLCALC  04/25/2010 0459    67        Total Cholesterol/HDL:CHD Risk Coronary Heart Disease Risk Table                     Men   Women  1/2 Average Risk   3.4   3.3  Average Risk       5.0   4.4  2 X Average Risk   9.6   7.1  3 X Average Risk  23.4   11.0        Use the calculated Patient Ratio above and the CHD Risk Table to determine the patient's CHD Risk.        ATP III CLASSIFICATION (LDL):  <100     mg/dL   Optimal  100-129  mg/dL   Near or Above                    Optimal  130-159  mg/dL   Borderline  160-189  mg/dL   High  >190     mg/dL   Very High   LDLDIRECT 123.2 09/11/2014 0850      Wt Readings from Last 3 Encounters:  07/08/18 139 lb 12.8 oz (63.4 kg)  07/04/18 139 lb (63 kg)  05/22/18 140 lb (63.5 kg)      Other studies Reviewed: Additional studies/ records that were reviewed today include: . Review of the above records demonstrates:    ASSESSMENT AND PLAN:  1.  Sinus bradycardia.     1. LBBB -   2. Diastolic dysfunction -   Stable,   On lasix She ran out of her potassium several weeks ago.  Her last  potassium level was 5.1.  We will check a basic metabolic profile today before refilling her potassium.  3. Anemia -    Plans  Per her primary MD   4. HTN-   BP is well controlled   5, PVD - no renal artery stenosis -  Has seen Dr. Florene Glen  6. LVH -   7. CKD - stage 5 CKD.     She has a fistula in her right upper arm.  Apparently the fistula needs to be revised. He is at low risk for this upcoming surgery.  She may hold her Plavix for 5 days prior to her surgery.  Current medicines are reviewed at length with the patient today.  The patient does not have concerns regarding medicines.  The following changes have been made:  no change  Labs/ tests ordered today include:   No orders of the defined types were placed in this encounter.   Disposition:   FU with me in 6 months       Mertie Moores, MD  07/08/2018 10:04 AM    [address], [place], [place]  [address] Phone: [phone]; Fax: [phone]

## 2018-07-09 ENCOUNTER — Telehealth: Payer: Self-pay | Admitting: Nurse Practitioner

## 2018-07-09 DIAGNOSIS — E876 Hypokalemia: Secondary | ICD-10-CM

## 2018-07-09 DIAGNOSIS — I5022 Chronic systolic (congestive) heart failure: Secondary | ICD-10-CM | POA: Diagnosis not present

## 2018-07-09 DIAGNOSIS — G40909 Epilepsy, unspecified, not intractable, without status epilepticus: Secondary | ICD-10-CM | POA: Diagnosis not present

## 2018-07-09 DIAGNOSIS — I5032 Chronic diastolic (congestive) heart failure: Secondary | ICD-10-CM

## 2018-07-09 DIAGNOSIS — N184 Chronic kidney disease, stage 4 (severe): Secondary | ICD-10-CM | POA: Diagnosis not present

## 2018-07-09 DIAGNOSIS — I130 Hypertensive heart and chronic kidney disease with heart failure and stage 1 through stage 4 chronic kidney disease, or unspecified chronic kidney disease: Secondary | ICD-10-CM | POA: Diagnosis not present

## 2018-07-09 DIAGNOSIS — I10 Essential (primary) hypertension: Secondary | ICD-10-CM

## 2018-07-09 DIAGNOSIS — I70209 Unspecified atherosclerosis of native arteries of extremities, unspecified extremity: Secondary | ICD-10-CM | POA: Diagnosis not present

## 2018-07-09 MED ORDER — POTASSIUM CHLORIDE CRYS ER 10 MEQ PO TBCR: 20.0000 meq | EXTENDED_RELEASE_TABLET | Freq: Two times a day (BID) | ORAL | 3 refills | Status: DC

## 2018-07-09 NOTE — Telephone Encounter (Signed)
-----   Message from Thayer Headings, MD sent at 07/08/2018  4:24 PM EDT ----- Potassium level is at the lower limits of normal .   She has not had potassium supplementation for several weeks.  Will not restart potassium at this point.     Recheck BMP in 3 months

## 2018-07-09 NOTE — Telephone Encounter (Signed)
Reviewed results and plan of care with patient who verbalized understanding and agreement. She is scheduled for repeat bmet on 10/31. I verified her pharmacy and she thanked me for the call.

## 2018-07-11 ENCOUNTER — Other Ambulatory Visit: Payer: Self-pay

## 2018-07-11 ENCOUNTER — Encounter (HOSPITAL_COMMUNITY): Payer: Self-pay | Admitting: *Deleted

## 2018-07-11 DIAGNOSIS — I70209 Unspecified atherosclerosis of native arteries of extremities, unspecified extremity: Secondary | ICD-10-CM | POA: Diagnosis not present

## 2018-07-11 DIAGNOSIS — G40909 Epilepsy, unspecified, not intractable, without status epilepticus: Secondary | ICD-10-CM | POA: Diagnosis not present

## 2018-07-11 DIAGNOSIS — E1122 Type 2 diabetes mellitus with diabetic chronic kidney disease: Secondary | ICD-10-CM | POA: Diagnosis not present

## 2018-07-11 DIAGNOSIS — I5032 Chronic diastolic (congestive) heart failure: Secondary | ICD-10-CM | POA: Diagnosis not present

## 2018-07-11 DIAGNOSIS — N185 Chronic kidney disease, stage 5: Secondary | ICD-10-CM | POA: Diagnosis not present

## 2018-07-11 DIAGNOSIS — I132 Hypertensive heart and chronic kidney disease with heart failure and with stage 5 chronic kidney disease, or end stage renal disease: Secondary | ICD-10-CM | POA: Diagnosis not present

## 2018-07-11 NOTE — Progress Notes (Signed)
Mrs Anselmo denies chest pain or shortness breath.  PCP is Dr Criss Rosales, Nephrologist is Dr Florene Glen, Cardiologist is Dr Sondra Come, patient saw Dr Sondra Come 07/08/18. Patient reported that Dr Sondra Come said she is cleared for surgery.  Patient last dose of Plavix 7/31/189

## 2018-07-15 ENCOUNTER — Encounter (HOSPITAL_COMMUNITY): Payer: Self-pay | Admitting: Anesthesiology

## 2018-07-15 ENCOUNTER — Ambulatory Visit (HOSPITAL_COMMUNITY): Payer: Medicare Other | Admitting: Anesthesiology

## 2018-07-15 ENCOUNTER — Ambulatory Visit (HOSPITAL_COMMUNITY)
Admission: RE | Admit: 2018-07-15 | Discharge: 2018-07-15 | Disposition: A | Discharge status: Home / Self Care | Payer: Medicare Other | Source: Ambulatory Visit | Attending: Vascular Surgery | Admitting: Vascular Surgery

## 2018-07-15 ENCOUNTER — Encounter (HOSPITAL_COMMUNITY)
Admission: RE | Disposition: A | Discharge status: Home / Self Care | Payer: Self-pay | Source: Ambulatory Visit | Attending: Vascular Surgery

## 2018-07-15 DIAGNOSIS — R569 Unspecified convulsions: Secondary | ICD-10-CM | POA: Insufficient documentation

## 2018-07-15 DIAGNOSIS — M1990 Unspecified osteoarthritis, unspecified site: Secondary | ICD-10-CM | POA: Insufficient documentation

## 2018-07-15 DIAGNOSIS — D631 Anemia in chronic kidney disease: Secondary | ICD-10-CM | POA: Insufficient documentation

## 2018-07-15 DIAGNOSIS — I509 Heart failure, unspecified: Secondary | ICD-10-CM | POA: Diagnosis not present

## 2018-07-15 DIAGNOSIS — K219 Gastro-esophageal reflux disease without esophagitis: Secondary | ICD-10-CM | POA: Diagnosis not present

## 2018-07-15 DIAGNOSIS — Z87891 Personal history of nicotine dependence: Secondary | ICD-10-CM | POA: Diagnosis not present

## 2018-07-15 DIAGNOSIS — N186 End stage renal disease: Secondary | ICD-10-CM | POA: Insufficient documentation

## 2018-07-15 DIAGNOSIS — I2510 Atherosclerotic heart disease of native coronary artery without angina pectoris: Secondary | ICD-10-CM | POA: Diagnosis not present

## 2018-07-15 DIAGNOSIS — Z7902 Long term (current) use of antithrombotics/antiplatelets: Secondary | ICD-10-CM | POA: Diagnosis not present

## 2018-07-15 DIAGNOSIS — I132 Hypertensive heart and chronic kidney disease with heart failure and with stage 5 chronic kidney disease, or end stage renal disease: Secondary | ICD-10-CM | POA: Insufficient documentation

## 2018-07-15 DIAGNOSIS — Z992 Dependence on renal dialysis: Secondary | ICD-10-CM | POA: Insufficient documentation

## 2018-07-15 DIAGNOSIS — I5032 Chronic diastolic (congestive) heart failure: Secondary | ICD-10-CM | POA: Diagnosis not present

## 2018-07-15 DIAGNOSIS — F419 Anxiety disorder, unspecified: Secondary | ICD-10-CM | POA: Insufficient documentation

## 2018-07-15 DIAGNOSIS — N184 Chronic kidney disease, stage 4 (severe): Secondary | ICD-10-CM | POA: Diagnosis not present

## 2018-07-15 DIAGNOSIS — N185 Chronic kidney disease, stage 5: Secondary | ICD-10-CM | POA: Diagnosis not present

## 2018-07-15 HISTORY — DX: Heart failure, unspecified: I50.9

## 2018-07-15 HISTORY — DX: Personal history of other medical treatment: Z92.89

## 2018-07-15 HISTORY — PX: BASCILIC VEIN TRANSPOSITION: SHX5742

## 2018-07-15 HISTORY — DX: Cardiac murmur, unspecified: R01.1

## 2018-07-15 LAB — POCT I-STAT 4, (NA,K, GLUC, HGB,HCT)
Glucose, Bld: 94 mg/dL (ref 70–99)
HCT: 32 % — ABNORMAL LOW (ref 36.0–46.0)
Hemoglobin: 10.9 g/dL — ABNORMAL LOW (ref 12.0–15.0)
Potassium: 4 mmol/L (ref 3.5–5.1)
SODIUM: 141 mmol/L (ref 135–145)

## 2018-07-15 SURGERY — TRANSPOSITION, VEIN, BASILIC
Anesthesia: Monitor Anesthesia Care | Site: Arm Upper | Laterality: Right

## 2018-07-15 MED ORDER — EPHEDRINE SULFATE-NACL 50-0.9 MG/10ML-% IV SOSY
PREFILLED_SYRINGE | INTRAVENOUS | Status: DC | PRN
  Administered 2018-07-15 (×2): 10 mg via INTRAVENOUS

## 2018-07-15 MED ORDER — ONDANSETRON HCL 4 MG/2ML IJ SOLN
INTRAMUSCULAR | Status: AC
  Filled 2018-07-15: qty 2

## 2018-07-15 MED ORDER — PROPOFOL 10 MG/ML IV BOLUS
INTRAVENOUS | Status: DC | PRN
  Administered 2018-07-15: 20 mg via INTRAVENOUS
  Administered 2018-07-15 (×2): 30 mg via INTRAVENOUS

## 2018-07-15 MED ORDER — LIDOCAINE-EPINEPHRINE 0.5 %-1:200000 IJ SOLN
INTRAMUSCULAR | Status: DC | PRN
  Administered 2018-07-15: 21 mL

## 2018-07-15 MED ORDER — CHLORHEXIDINE GLUCONATE 4 % EX LIQD: 60.0000 mL | Freq: Once | CUTANEOUS | Status: DC

## 2018-07-15 MED ORDER — VANCOMYCIN HCL IN DEXTROSE 1-5 GM/200ML-% IV SOLN
INTRAVENOUS | Status: AC
  Filled 2018-07-15: qty 200

## 2018-07-15 MED ORDER — FENTANYL CITRATE (PF) 100 MCG/2ML IJ SOLN
INTRAMUSCULAR | Status: DC | PRN
  Administered 2018-07-15: 50 ug via INTRAVENOUS
  Administered 2018-07-15 (×2): 25 ug via INTRAVENOUS
  Administered 2018-07-15: 50 ug via INTRAVENOUS

## 2018-07-15 MED ORDER — PROPOFOL 10 MG/ML IV BOLUS
INTRAVENOUS | Status: AC
Start: 2018-07-15 — End: ?
  Filled 2018-07-15: qty 20

## 2018-07-15 MED ORDER — FENTANYL CITRATE (PF) 250 MCG/5ML IJ SOLN
INTRAMUSCULAR | Status: AC
  Filled 2018-07-15: qty 5

## 2018-07-15 MED ORDER — SODIUM CHLORIDE 0.9 % IV SOLN
INTRAVENOUS | Status: DC | PRN
  Administered 2018-07-15: 500 mL

## 2018-07-15 MED ORDER — MEPERIDINE HCL 50 MG/ML IJ SOLN: 6.2500 mg | INTRAMUSCULAR | Status: DC | PRN

## 2018-07-15 MED ORDER — LIDOCAINE-EPINEPHRINE 0.5 %-1:200000 IJ SOLN
INTRAMUSCULAR | Status: AC
  Filled 2018-07-15: qty 1

## 2018-07-15 MED ORDER — FENTANYL CITRATE (PF) 100 MCG/2ML IJ SOLN
INTRAMUSCULAR | Status: AC
  Filled 2018-07-15: qty 2

## 2018-07-15 MED ORDER — SODIUM CHLORIDE 0.9 % IV SOLN
INTRAVENOUS | Status: DC
  Administered 2018-07-15: 09:00:00 via INTRAVENOUS

## 2018-07-15 MED ORDER — OXYCODONE-ACETAMINOPHEN 5-325 MG PO TABS
1.0000 | ORAL_TABLET | Freq: Four times a day (QID) | ORAL | 0 refills | Status: AC | PRN
Start: 2018-07-15 — End: ?

## 2018-07-15 MED ORDER — VANCOMYCIN HCL IN DEXTROSE 1-5 GM/200ML-% IV SOLN
1000.0000 mg | INTRAVENOUS | Status: AC
  Administered 2018-07-15: 1000 mg via INTRAVENOUS

## 2018-07-15 MED ORDER — DEXMEDETOMIDINE HCL IN NACL 200 MCG/50ML IV SOLN
INTRAVENOUS | Status: DC | PRN
  Administered 2018-07-15: 8 ug via INTRAVENOUS
  Administered 2018-07-15 (×5): 4 ug via INTRAVENOUS

## 2018-07-15 MED ORDER — 0.9 % SODIUM CHLORIDE (POUR BTL) OPTIME
TOPICAL | Status: DC | PRN
  Administered 2018-07-15: 1000 mL

## 2018-07-15 MED ORDER — PROPOFOL 500 MG/50ML IV EMUL
INTRAVENOUS | Status: DC | PRN
  Administered 2018-07-15: 50 ug/kg/min via INTRAVENOUS

## 2018-07-15 MED ORDER — ONDANSETRON HCL 4 MG/2ML IJ SOLN
INTRAMUSCULAR | Status: DC | PRN
  Administered 2018-07-15: 4 mg via INTRAVENOUS

## 2018-07-15 MED ORDER — HEPARIN SODIUM (PORCINE) 1000 UNIT/ML IJ SOLN
INTRAMUSCULAR | Status: AC
  Filled 2018-07-15: qty 1

## 2018-07-15 MED ORDER — SODIUM CHLORIDE 0.9 % IV SOLN
INTRAVENOUS | Status: DC | PRN
  Administered 2018-07-15: 20 ug/min via INTRAVENOUS

## 2018-07-15 MED ORDER — FENTANYL CITRATE (PF) 100 MCG/2ML IJ SOLN
25.0000 ug | INTRAMUSCULAR | Status: DC | PRN
  Administered 2018-07-15: 50 ug via INTRAVENOUS

## 2018-07-15 SURGICAL SUPPLY — 34 items
ADH SKN CLS APL DERMABOND .7 (GAUZE/BANDAGES/DRESSINGS) ×1
ARMBAND PINK RESTRICT EXTREMIT (MISCELLANEOUS) ×3 IMPLANT
CANISTER SUCT 3000ML PPV (MISCELLANEOUS) ×3 IMPLANT
CANNULA VESSEL 3MM 2 BLNT TIP (CANNULA) ×3 IMPLANT
CLIP LIGATING EXTRA MED SLVR (CLIP) ×3 IMPLANT
CLIP LIGATING EXTRA SM BLUE (MISCELLANEOUS) ×3 IMPLANT
COVER PROBE W GEL 5X96 (DRAPES) ×3 IMPLANT
DECANTER SPIKE VIAL GLASS SM (MISCELLANEOUS) ×3 IMPLANT
DERMABOND ADVANCED (GAUZE/BANDAGES/DRESSINGS) ×2
DERMABOND ADVANCED .7 DNX12 (GAUZE/BANDAGES/DRESSINGS) ×1 IMPLANT
ELECT REM PT RETURN 9FT ADLT (ELECTROSURGICAL) ×3
ELECTRODE REM PT RTRN 9FT ADLT (ELECTROSURGICAL) ×1 IMPLANT
GLOVE BIO SURGEON STRL SZ 6.5 (GLOVE) ×2 IMPLANT
GLOVE BIO SURGEONS STRL SZ 6.5 (GLOVE) ×2
GLOVE SS BIOGEL STRL SZ 7.5 (GLOVE) ×1 IMPLANT
GLOVE SUPERSENSE BIOGEL SZ 7.5 (GLOVE) ×2
GLOVE SURG SS PI 7.0 STRL IVOR (GLOVE) ×2 IMPLANT
GOWN STRL REUS W/ TWL LRG LVL3 (GOWN DISPOSABLE) ×3 IMPLANT
GOWN STRL REUS W/TWL LRG LVL3 (GOWN DISPOSABLE) ×9
KIT BASIN OR (CUSTOM PROCEDURE TRAY) ×3 IMPLANT
KIT TURNOVER KIT B (KITS) ×3 IMPLANT
NS IRRIG 1000ML POUR BTL (IV SOLUTION) ×3 IMPLANT
PACK CV ACCESS (CUSTOM PROCEDURE TRAY) ×3 IMPLANT
PAD ARMBOARD 7.5X6 YLW CONV (MISCELLANEOUS) ×6 IMPLANT
SUT PROLENE 6 0 BV (SUTURE) ×2 IMPLANT
SUT PROLENE 6 0 CC (SUTURE) ×5 IMPLANT
SUT PROLENE 7 0 BV 1 (SUTURE) ×2 IMPLANT
SUT SILK 2 0 SH (SUTURE) ×2 IMPLANT
SUT VIC AB 3-0 SH 27 (SUTURE) ×6
SUT VIC AB 3-0 SH 27X BRD (SUTURE) ×1 IMPLANT
SUT VICRYL 4-0 PS2 18IN ABS (SUTURE) ×4 IMPLANT
TOWEL GREEN STERILE (TOWEL DISPOSABLE) ×3 IMPLANT
UNDERPAD 30X30 (UNDERPADS AND DIAPERS) ×3 IMPLANT
WATER STERILE IRR 1000ML POUR (IV SOLUTION) ×3 IMPLANT

## 2018-07-15 NOTE — Anesthesia Preprocedure Evaluation (Signed)
Anesthesia Evaluation  Patient identified by MRN, date of birth, ID band Patient awake    Reviewed: Allergy & Precautions, NPO status , Patient's Chart, lab work & pertinent test results  Airway Mallampati: II  TM Distance: >3 FB Neck ROM: Full    Dental no notable dental hx. (+) Edentulous Upper, Edentulous Lower   Pulmonary former smoker,    Pulmonary exam normal breath sounds clear to auscultation       Cardiovascular hypertension, + CAD and +CHF  Normal cardiovascular exam Rhythm:Regular Rate:Normal     Neuro/Psych Seizures -,  Anxiety  Neuromuscular disease    GI/Hepatic Neg liver ROS, hiatal hernia, GERD  ,  Endo/Other  negative endocrine ROS  Renal/GU Renal Insufficiency and ESRFRenal disease  negative genitourinary   Musculoskeletal  (+) Arthritis , Osteoarthritis,    Abdominal   Peds negative pediatric ROS (+)  Hematology  (+) anemia ,   Anesthesia Other Findings   Reproductive/Obstetrics negative OB ROS                             Anesthesia Physical  Anesthesia Plan  ASA: III  Anesthesia Plan: MAC   Post-op Pain Management:    Induction: Intravenous  PONV Risk Score and Plan: 2 and Ondansetron and Treatment may vary due to age or medical condition  Airway Management Planned: Simple Face Mask, Nasal Cannula and Natural Airway  Additional Equipment:   Intra-op Plan:   Post-operative Plan:   Informed Consent: I have reviewed the patients History and Physical, chart, labs and discussed the procedure including the risks, benefits and alternatives for the proposed anesthesia with the patient or authorized representative who has indicated his/her understanding and acceptance.   Dental advisory given  Plan Discussed with: CRNA, Anesthesiologist and Surgeon  Anesthesia Plan Comments:         Anesthesia Quick Evaluation

## 2018-07-15 NOTE — Transfer of Care (Signed)
Immediate Anesthesia Transfer of Care Note  Patient: [name]  Procedure(s) Performed: SECOND STAGE BASILIC VEIN TRANSPOSITION RIGHT ARM (Right Arm Upper)  Patient Location: PACU  Anesthesia Type:MAC  Level of Consciousness: awake, alert , oriented and patient cooperative  Airway & Oxygen Therapy: Patient Spontanous Breathing and Patient connected to nasal cannula oxygen  Post-op Assessment: Report given to RN and Post -op Vital signs reviewed and stable  Post vital signs: Reviewed and stable  Last Vitals:  Vitals Value Taken Time  BP 131/50 07/15/2018 12:23 PM  Temp    Pulse 70 07/15/2018 12:24 PM  Resp 14 07/15/2018 12:24 PM  SpO2 99 % 07/15/2018 12:24 PM  Vitals shown include unvalidated device data.  Last Pain:  Vitals:   07/15/18 0826  TempSrc: Oral         Complications: No apparent anesthesia complications

## 2018-07-15 NOTE — Discharge Instructions (Signed)
° °  [facility]  Discharge Instructions  AV Fistula or Graft Surgery for Dialysis Access  Please refer to the following instructions for your post-procedure care. Your surgeon or physician assistant will discuss any changes with you.  Activity  You may drive the day following your surgery, if you are comfortable and no longer taking prescription pain medication. Resume full activity as the soreness in your incision resolves.  Bathing/Showering  You may shower after you go home. Keep your incision dry for 48 hours. Do not soak in a bathtub, hot tub, or swim until the incision heals completely. You may not shower if you have a hemodialysis catheter.  Incision Care  Clean your incision with mild soap and water after 48 hours. Pat the area dry with a clean towel. You do not need a bandage unless otherwise instructed. Do not apply any ointments or creams to your incision. You may have skin glue on your incision. Do not peel it off. It will come off on its own in about one week. Your arm may swell a bit after surgery. To reduce swelling use pillows to elevate your arm so it is above your heart. Your doctor will tell you if you need to lightly wrap your arm with an ACE bandage.  Diet  Resume your normal diet. There are not special food restrictions following this procedure. In order to heal from your surgery, it is CRITICAL to get adequate nutrition. Your body requires vitamins, minerals, and protein. Vegetables are the best source of vitamins and minerals. Vegetables also provide the perfect balance of protein. Processed food has little nutritional value, so try to avoid this.  Medications  Resume taking all of your medications. If your incision is causing pain, you may take over-the counter pain relievers such as acetaminophen (Tylenol). If you were prescribed a stronger pain medication, please be aware these medications can cause nausea and constipation. Prevent  nausea by taking the medication with a snack or meal. Avoid constipation by drinking plenty of fluids and eating foods with high amount of fiber, such as fruits, vegetables, and grains.  Do not take Tylenol if you are taking prescription pain medications.  Follow up Your surgeon may want to see you in the office following your access surgery. If so, this will be arranged at the time of your surgery.  Please call us immediately for any of the following conditions:  Increased pain, redness, drainage (pus) from your incision site Fever of 101 degrees or higher Severe or worsening pain at your incision site Hand pain or numbness.  Reduce your risk of vascular disease:  Stop smoking. If you would like help, call [name] at 1-800-QUIT-NOW (506)085-1431) or [facility] at [place] your cholesterol Maintain a desired weight Control your diabetes Keep your blood pressure down  Dialysis  It will take several weeks to several months for your new dialysis access to be ready for use. Your surgeon will determine when it is okay to use it. Your nephrologist will continue to direct your dialysis. You can continue to use your Permcath until your new access is ready for use.   07/15/2018 [name] [id number] [birth year]  Surgeon(s): Early, Arvilla Meres, MD  Procedure(s): SECOND STAGE BRACHIAL VEIN TRANSPOSITION RIGHT ARM  x Do not stick fistula for 6 weeks    If you have any questions, please call the office at [phone].

## 2018-07-15 NOTE — Interval H&P Note (Signed)
History and Physical Interval Note:  07/15/2018 9:31 AM  [name]  has presented today for surgery, with the diagnosis of CHRONIC KIDNEY DISEASE FOR HEMODIALYSIS ACCESS  The various methods of treatment have been discussed with the patient and family. After consideration of risks, benefits and other options for treatment, the patient has consented to  Procedure(s): SECOND STAGE BASILIC VEIN TRANSPOSITION RIGHT ARM (Right) as a surgical intervention .  The patient's history has been reviewed, patient examined, no change in status, stable for surgery.  I have reviewed the patient's chart and labs.  Questions were answered to the patient's satisfaction.     Curt Jews

## 2018-07-15 NOTE — Anesthesia Procedure Notes (Signed)
Procedure Name: MAC Date/Time: 07/15/2018 10:11 AM Performed by: Renato Shin, CRNA Pre-anesthesia Checklist: Patient identified, Emergency Drugs available, Suction available and Patient being monitored Patient Re-evaluated:Patient Re-evaluated prior to induction Oxygen Delivery Method: Nasal cannula Preoxygenation: Pre-oxygenation with 100% oxygen Induction Type: IV induction Placement Confirmation: positive ETCO2,  CO2 detector and breath sounds checked- equal and bilateral Dental Injury: Teeth and Oropharynx as per pre-operative assessment

## 2018-07-15 NOTE — Op Note (Signed)
    OPERATIVE REPORT  DATE OF SURGERY: 07/15/2018  PATIENT: [name], 84 y.o. female MRN: [id number]  DOB: 1930/05/07  PRE-OPERATIVE DIAGNOSIS: Chronic renal insufficiency  POST-OPERATIVE DIAGNOSIS:  Same  PROCEDURE: Right second stage brachial vein transposition fistula  SURGEON:  Curt Jews, M.D.  PHYSICIAN ASSISTANT: Liana Crocker, PA-C  ANESTHESIA: Local with sedation  EBL: per anesthesia record  Total I/O In: 100 [I.V.:100] Out: 50 [Blood:50]  BLOOD ADMINISTERED: none  DRAINS: none  SPECIMEN: none  COUNTS CORRECT:  YES  PATIENT DISPOSITION:  PACU - hemodynamically stable  PROCEDURE DETAILS: The patient was taken to the operative placed supine position where the area of the left arm prepped draped you sterile fashion.  Incision was made over the prior brachial artery to brachial vein anastomosis at the antecubital space and the vein was identified.  Several skip incisions were made throughout the medial upper arm down to the axilla and the brachial vein was mobilized.  Patient had multiple tributary branches and these were ligated with 3-0 and 4-0 silk ties and divided.  The vein was occluded at the brachial artery anastomosis and was transected.  The vein was brought out through the tunnel to the level of the axilla.  The vein had been marked in its bed prior to removal to reduce risk for twisting.  A tunnel was created from the level antecubital space to the axilla and the vein was brought back through the tunnel.  The brachial artery was occluded proximal distal to the old anastomosis and the old anastomosis was excised.  The vein was slightly spatulated and sewn Gonzalez-to-side to the brachial artery with a running 6-0 Prolene suture.  Clamps removed and good thrill was noted.  Wounds irrigated with saline.  Hemostasis with cautery.  Wound closed with 3-0 Vicryl in the subcutaneous and subcuticular tissue.  Sterile dressing was applied and the patient was transferred to  the recovery room in stable condition.  She did have a palpable radial pulse.   Rosetta Posner, M.D., [facility] 07/15/2018 3:07 PM

## 2018-07-16 ENCOUNTER — Telehealth: Payer: Self-pay | Admitting: Vascular Surgery

## 2018-07-16 NOTE — Telephone Encounter (Signed)
Called pt. lvm on daughter vm 08/13/18 3pm dialysis duplex 4pm p/o PA

## 2018-07-16 NOTE — Anesthesia Postprocedure Evaluation (Signed)
Anesthesia Post Note  Patient: Emelly Wurtz  Procedure(s) Performed: SECOND STAGE BASILIC VEIN TRANSPOSITION RIGHT ARM (Right Arm Upper)     Patient location during evaluation: PACU Anesthesia Type: MAC Level of consciousness: awake and alert Pain management: pain level controlled Vital Signs Assessment: post-procedure vital signs reviewed and stable Respiratory status: spontaneous breathing, nonlabored ventilation, respiratory function stable and patient connected to nasal cannula oxygen Cardiovascular status: stable and blood pressure returned to baseline Postop Assessment: no apparent nausea or vomiting Anesthetic complications: no    Last Vitals:  Vitals:   07/15/18 1310 07/15/18 1325  BP: (!) 152/59 (!) 147/56  Pulse: 69 (!) 42  Resp: 19 19  Temp:    SpO2: 95% (!) 78%    Last Pain:  Vitals:   07/15/18 1255  TempSrc:   PainSc: 3                  Dorrene Bently

## 2018-07-17 ENCOUNTER — Encounter (HOSPITAL_COMMUNITY): Payer: Self-pay | Admitting: Vascular Surgery

## 2018-07-18 ENCOUNTER — Other Ambulatory Visit: Payer: Self-pay

## 2018-07-18 DIAGNOSIS — N185 Chronic kidney disease, stage 5: Secondary | ICD-10-CM

## 2018-07-24 DIAGNOSIS — N185 Chronic kidney disease, stage 5: Secondary | ICD-10-CM | POA: Diagnosis not present

## 2018-07-24 DIAGNOSIS — E1122 Type 2 diabetes mellitus with diabetic chronic kidney disease: Secondary | ICD-10-CM | POA: Diagnosis not present

## 2018-07-24 DIAGNOSIS — I5032 Chronic diastolic (congestive) heart failure: Secondary | ICD-10-CM | POA: Diagnosis not present

## 2018-07-24 DIAGNOSIS — I70209 Unspecified atherosclerosis of native arteries of extremities, unspecified extremity: Secondary | ICD-10-CM | POA: Diagnosis not present

## 2018-07-24 DIAGNOSIS — G40909 Epilepsy, unspecified, not intractable, without status epilepticus: Secondary | ICD-10-CM | POA: Diagnosis not present

## 2018-07-24 DIAGNOSIS — I132 Hypertensive heart and chronic kidney disease with heart failure and with stage 5 chronic kidney disease, or end stage renal disease: Secondary | ICD-10-CM | POA: Diagnosis not present

## 2018-07-31 DIAGNOSIS — N185 Chronic kidney disease, stage 5: Secondary | ICD-10-CM | POA: Diagnosis not present

## 2018-07-31 DIAGNOSIS — I132 Hypertensive heart and chronic kidney disease with heart failure and with stage 5 chronic kidney disease, or end stage renal disease: Secondary | ICD-10-CM | POA: Diagnosis not present

## 2018-07-31 DIAGNOSIS — I70209 Unspecified atherosclerosis of native arteries of extremities, unspecified extremity: Secondary | ICD-10-CM | POA: Diagnosis not present

## 2018-07-31 DIAGNOSIS — I5032 Chronic diastolic (congestive) heart failure: Secondary | ICD-10-CM | POA: Diagnosis not present

## 2018-07-31 DIAGNOSIS — E1122 Type 2 diabetes mellitus with diabetic chronic kidney disease: Secondary | ICD-10-CM | POA: Diagnosis not present

## 2018-07-31 DIAGNOSIS — G40909 Epilepsy, unspecified, not intractable, without status epilepticus: Secondary | ICD-10-CM | POA: Diagnosis not present

## 2018-08-08 DIAGNOSIS — G40909 Epilepsy, unspecified, not intractable, without status epilepticus: Secondary | ICD-10-CM | POA: Diagnosis not present

## 2018-08-08 DIAGNOSIS — N185 Chronic kidney disease, stage 5: Secondary | ICD-10-CM | POA: Diagnosis not present

## 2018-08-08 DIAGNOSIS — I5032 Chronic diastolic (congestive) heart failure: Secondary | ICD-10-CM | POA: Diagnosis not present

## 2018-08-08 DIAGNOSIS — E1122 Type 2 diabetes mellitus with diabetic chronic kidney disease: Secondary | ICD-10-CM | POA: Diagnosis not present

## 2018-08-08 DIAGNOSIS — I70209 Unspecified atherosclerosis of native arteries of extremities, unspecified extremity: Secondary | ICD-10-CM | POA: Diagnosis not present

## 2018-08-08 DIAGNOSIS — I132 Hypertensive heart and chronic kidney disease with heart failure and with stage 5 chronic kidney disease, or end stage renal disease: Secondary | ICD-10-CM | POA: Diagnosis not present

## 2018-08-13 ENCOUNTER — Other Ambulatory Visit: Payer: Self-pay

## 2018-08-13 ENCOUNTER — Ambulatory Visit (HOSPITAL_COMMUNITY)
Admission: RE | Admit: 2018-08-13 | Discharge: 2018-08-13 | Disposition: A | Discharge status: Home / Self Care | Payer: Medicare Other | Source: Ambulatory Visit | Attending: Vascular Surgery | Admitting: Vascular Surgery

## 2018-08-13 ENCOUNTER — Ambulatory Visit (INDEPENDENT_AMBULATORY_CARE_PROVIDER_SITE_OTHER): Payer: Self-pay | Admitting: Physician Assistant

## 2018-08-13 VITALS — BP 150/72 | HR 71 | Temp 96.9°F | Resp 14 | Ht 59.0 in | Wt 137.3 lb

## 2018-08-13 DIAGNOSIS — N185 Chronic kidney disease, stage 5: Secondary | ICD-10-CM

## 2018-08-13 DIAGNOSIS — Z4931 Encounter for adequacy testing for hemodialysis: Secondary | ICD-10-CM | POA: Diagnosis not present

## 2018-08-13 NOTE — Progress Notes (Signed)
POST OPERATIVE OFFICE NOTE    CC:  F/u for surgery  HPI:  This is a 84 y.o. female who is s/p Right second stage brachial vein transposition fistula by Dr. Donnetta Hutching.  She is here today for a follow up visit to check for maturity of the fistula.    She denise pain, loss of sensation and loss of motor in the right UE.  She is not yet on HD.  There have been no changes to her medical history since she was last seen in out office.  Allergies  Allergen Reactions  . Haldol [Haloperidol Decanoate] Other (See Comments)    Agitation, aggressive and combative  . Lorazepam Other (See Comments)    Agitation, lethargic  . Cephalexin Other (See Comments)    REACTION: Questionable, no reaction listed    Current Outpatient Medications  Medication Sig Dispense Refill  . amLODipine (NORVASC) 10 MG tablet TAKE 1 TABLET(10 MG) BY MOUTH DAILY 30 tablet 11  . Carboxymethylcellul-Glycerin (LUBRICATING EYE DROPS OP) Place 1 drop into both eyes daily as needed (dry eyes).    . clopidogrel (PLAVIX) 75 MG tablet Take 75 mg by mouth daily.     Marland Kitchen doxazosin (CARDURA) 8 MG tablet TAKE 1 TABLET(8 MG) BY MOUTH DAILY 30 tablet 0  . furosemide (LASIX) 80 MG tablet Take 80 mg by mouth daily.    . hydrALAZINE (APRESOLINE) 25 MG tablet Take 25 mg by mouth 3 (three) times daily.    . hydrALAZINE (APRESOLINE) 50 MG tablet Take 1 tablet (50 mg total) by mouth 3 (three) times daily. 90 tablet 0  . levETIRAcetam (KEPPRA) 500 MG tablet Take 500 mg by mouth 2 (two) times daily.    . potassium chloride (K-DUR,KLOR-CON) 10 MEQ tablet Take 2 tablets (20 mEq total) by mouth 2 (two) times daily. 180 tablet 3  . oxyCODONE-acetaminophen (PERCOCET) 5-325 MG tablet Take 1 tablet by mouth every 6 (six) hours as needed for severe pain. (Patient not taking: Reported on 08/13/2018) 12 tablet 0   No current facility-administered medications for this visit.      ROS:  See HPI  Physical Exam:  Vitals:   08/13/18 1538 08/13/18 1543  BP:  (!) 170/66 (!) 150/72  Pulse: 70 71  Resp: 14   Temp: (!) 96.9 F (36.1 C)   SpO2: 99%     Incision:  Well healed incisions medial right UE Extremities:  Palpable radial pulse, grip 5/5 and sensation intact Heart: RRR Lungs:  Non labored breathing  Fistula duplex was performed in the office today Diameter was 0.41-0.22 Depth 0.38-0.63  Assessment/Plan:  This is a 84 y.o. female who is s/p: Transposition right basilic AV fistula  Pre transposition the fistula had a diameter of 0.5.  On the duplex today it is smaller 0.41-0.22.  The velocities changes from anti cubital [phone] as it becomes more proximal up the arm.  There must be areas of narrowing.    She is not yet on HD and has a follow up appt. With Dr. Florene Glen on 09/05/2018.  I have scheduled her to come back to see DR. Early.  He was over booked, but she will be seen on the PA schedule on 09-03-2018 when Dr. Donnetta Hutching is in clinic.  I discussed this with DR. Clark today.  I didn't want to schedule;e her for a fistulagram that may push her into renal failure early, CO2 can't be used above the diaphragm and the patient is 84 y/o.  I would like DR.  Early to review this with the patient and make recommendations.     Roxy Horseman , PA-C [facility] [phone]

## 2018-08-14 DIAGNOSIS — I5032 Chronic diastolic (congestive) heart failure: Secondary | ICD-10-CM | POA: Diagnosis not present

## 2018-08-14 DIAGNOSIS — G40909 Epilepsy, unspecified, not intractable, without status epilepticus: Secondary | ICD-10-CM | POA: Diagnosis not present

## 2018-08-14 DIAGNOSIS — N185 Chronic kidney disease, stage 5: Secondary | ICD-10-CM | POA: Diagnosis not present

## 2018-08-14 DIAGNOSIS — I70209 Unspecified atherosclerosis of native arteries of extremities, unspecified extremity: Secondary | ICD-10-CM | POA: Diagnosis not present

## 2018-08-14 DIAGNOSIS — I132 Hypertensive heart and chronic kidney disease with heart failure and with stage 5 chronic kidney disease, or end stage renal disease: Secondary | ICD-10-CM | POA: Diagnosis not present

## 2018-08-14 DIAGNOSIS — E1122 Type 2 diabetes mellitus with diabetic chronic kidney disease: Secondary | ICD-10-CM | POA: Diagnosis not present

## 2018-08-20 DIAGNOSIS — I120 Hypertensive chronic kidney disease with stage 5 chronic kidney disease or end stage renal disease: Secondary | ICD-10-CM | POA: Diagnosis not present

## 2018-08-20 DIAGNOSIS — Z6828 Body mass index (BMI) 28.0-28.9, adult: Secondary | ICD-10-CM | POA: Diagnosis not present

## 2018-08-20 DIAGNOSIS — I700 Atherosclerosis of aorta: Secondary | ICD-10-CM | POA: Diagnosis not present

## 2018-08-20 DIAGNOSIS — G40909 Epilepsy, unspecified, not intractable, without status epilepticus: Secondary | ICD-10-CM | POA: Diagnosis not present

## 2018-08-21 DIAGNOSIS — I5032 Chronic diastolic (congestive) heart failure: Secondary | ICD-10-CM | POA: Diagnosis not present

## 2018-08-21 DIAGNOSIS — I70209 Unspecified atherosclerosis of native arteries of extremities, unspecified extremity: Secondary | ICD-10-CM | POA: Diagnosis not present

## 2018-08-21 DIAGNOSIS — E1122 Type 2 diabetes mellitus with diabetic chronic kidney disease: Secondary | ICD-10-CM | POA: Diagnosis not present

## 2018-08-21 DIAGNOSIS — G40909 Epilepsy, unspecified, not intractable, without status epilepticus: Secondary | ICD-10-CM | POA: Diagnosis not present

## 2018-08-21 DIAGNOSIS — I132 Hypertensive heart and chronic kidney disease with heart failure and with stage 5 chronic kidney disease, or end stage renal disease: Secondary | ICD-10-CM | POA: Diagnosis not present

## 2018-08-21 DIAGNOSIS — N185 Chronic kidney disease, stage 5: Secondary | ICD-10-CM | POA: Diagnosis not present

## 2018-08-28 DIAGNOSIS — N185 Chronic kidney disease, stage 5: Secondary | ICD-10-CM | POA: Diagnosis not present

## 2018-08-28 DIAGNOSIS — E1122 Type 2 diabetes mellitus with diabetic chronic kidney disease: Secondary | ICD-10-CM | POA: Diagnosis not present

## 2018-08-28 DIAGNOSIS — I132 Hypertensive heart and chronic kidney disease with heart failure and with stage 5 chronic kidney disease, or end stage renal disease: Secondary | ICD-10-CM | POA: Diagnosis not present

## 2018-08-28 DIAGNOSIS — I70209 Unspecified atherosclerosis of native arteries of extremities, unspecified extremity: Secondary | ICD-10-CM | POA: Diagnosis not present

## 2018-08-28 DIAGNOSIS — G40909 Epilepsy, unspecified, not intractable, without status epilepticus: Secondary | ICD-10-CM | POA: Diagnosis not present

## 2018-08-28 DIAGNOSIS — I5032 Chronic diastolic (congestive) heart failure: Secondary | ICD-10-CM | POA: Diagnosis not present

## 2018-08-31 DIAGNOSIS — Z23 Encounter for immunization: Secondary | ICD-10-CM | POA: Diagnosis not present

## 2018-09-03 ENCOUNTER — Ambulatory Visit (INDEPENDENT_AMBULATORY_CARE_PROVIDER_SITE_OTHER): Payer: Self-pay | Admitting: Physician Assistant

## 2018-09-03 ENCOUNTER — Other Ambulatory Visit: Payer: Self-pay

## 2018-09-03 ENCOUNTER — Encounter: Payer: Self-pay | Admitting: Physician Assistant

## 2018-09-03 VITALS — BP 190/65 | HR 79 | Resp 18 | Ht 59.0 in | Wt 139.2 lb

## 2018-09-03 DIAGNOSIS — N185 Chronic kidney disease, stage 5: Secondary | ICD-10-CM

## 2018-09-03 NOTE — Progress Notes (Signed)
    Postoperative Access Visit   History of Present Illness   [name] is a 84 y.o. year old female who presents for postoperative follow-up for: right second stage brachial vein transposition by Dr. Donnetta Hutching 07/15/18.  The patient's wounds are healed.  The patient denies steal symptoms.  The patient is able to complete their activities of daily living.  She is not yet on hemodialysis.  Despite transposition, fistula appears to still be on the smaller side in diameter.  She has a follow up with Dr. Florene Glen to assess CKD this Thursday.  Physical Examination   Vitals:   09/03/18 1623 09/03/18 1624  BP: (!) 193/71 (!) 190/65  Pulse: 79   Resp: 18   SpO2: 97%   Weight: 139 lb 3.2 oz (63.1 kg)   Height: 4\' 11"  (1.499 m)    Body mass index is 28.11 kg/m.  right arm Incision is healed, hand grip is 5/5, sensation in digits is intact, palpable thrill, bruit can be auscultated; palpable R radial pulse     Medical Decision Making   [name] is a 84 y.o. year old female who presents s/p right second stage brachial vein transposition   Dr. Donnetta Hutching evaluated the patient with me today and was involved in the treatment plan  Right arm transposed fistula is patent and has a good palpable thrill and audible bruit  Fistula does appear to be somewhat small despite having a good thrill  Patient is not yet on hemodialysis; we will proceed conservatively and allow more time for fistula to mature  The patient's fistula will need to be reevaluated by Dr. Donnetta Hutching when initiation of hemodialysis is anticipated to decide whether to proceed with cannulation of fistula or fistulogram  The patient has a follow-up appointment with Dr. Florene Glen this Thursday   Dagoberto Ligas PA-C [facility] Office: [phone]

## 2018-09-05 DIAGNOSIS — I701 Atherosclerosis of renal artery: Secondary | ICD-10-CM | POA: Diagnosis not present

## 2018-09-05 DIAGNOSIS — N185 Chronic kidney disease, stage 5: Secondary | ICD-10-CM | POA: Diagnosis not present

## 2018-09-05 DIAGNOSIS — I770 Arteriovenous fistula, acquired: Secondary | ICD-10-CM | POA: Diagnosis not present

## 2018-09-05 DIAGNOSIS — I120 Hypertensive chronic kidney disease with stage 5 chronic kidney disease or end stage renal disease: Secondary | ICD-10-CM | POA: Diagnosis not present

## 2018-09-05 DIAGNOSIS — N2581 Secondary hyperparathyroidism of renal origin: Secondary | ICD-10-CM | POA: Diagnosis not present

## 2018-09-05 DIAGNOSIS — N261 Atrophy of kidney (terminal): Secondary | ICD-10-CM | POA: Diagnosis not present

## 2018-09-06 DIAGNOSIS — I5032 Chronic diastolic (congestive) heart failure: Secondary | ICD-10-CM | POA: Diagnosis not present

## 2018-09-06 DIAGNOSIS — I70209 Unspecified atherosclerosis of native arteries of extremities, unspecified extremity: Secondary | ICD-10-CM | POA: Diagnosis not present

## 2018-09-06 DIAGNOSIS — G40909 Epilepsy, unspecified, not intractable, without status epilepticus: Secondary | ICD-10-CM | POA: Diagnosis not present

## 2018-09-06 DIAGNOSIS — N185 Chronic kidney disease, stage 5: Secondary | ICD-10-CM | POA: Diagnosis not present

## 2018-09-06 DIAGNOSIS — I132 Hypertensive heart and chronic kidney disease with heart failure and with stage 5 chronic kidney disease, or end stage renal disease: Secondary | ICD-10-CM | POA: Diagnosis not present

## 2018-09-06 DIAGNOSIS — E1122 Type 2 diabetes mellitus with diabetic chronic kidney disease: Secondary | ICD-10-CM | POA: Diagnosis not present

## 2018-10-10 ENCOUNTER — Other Ambulatory Visit: Payer: Medicare Other

## 2018-10-10 DIAGNOSIS — I5032 Chronic diastolic (congestive) heart failure: Secondary | ICD-10-CM | POA: Diagnosis not present

## 2018-10-10 DIAGNOSIS — I10 Essential (primary) hypertension: Secondary | ICD-10-CM | POA: Diagnosis not present

## 2018-10-10 DIAGNOSIS — E876 Hypokalemia: Secondary | ICD-10-CM

## 2018-10-10 LAB — BASIC METABOLIC PANEL
BUN / CREAT RATIO: 13 (ref 12–28)
BUN: 61 mg/dL — ABNORMAL HIGH (ref 8–27)
CO2: 15 mmol/L — AB (ref 20–29)
Calcium: 10.3 mg/dL (ref 8.7–10.3)
Chloride: 100 mmol/L (ref 96–106)
Creatinine, Ser: 4.77 mg/dL — ABNORMAL HIGH (ref 0.57–1.00)
GFR, EST AFRICAN AMERICAN: 9 mL/min/{1.73_m2} — AB (ref >59)
GFR, EST NON AFRICAN AMERICAN: 8 mL/min/{1.73_m2} — AB (ref >59)
Glucose: 95 mg/dL (ref 65–99)
Potassium: 4.2 mmol/L (ref 3.5–5.2)
SODIUM: 137 mmol/L (ref 134–144)

## 2018-10-30 ENCOUNTER — Other Ambulatory Visit: Payer: Self-pay

## 2018-10-30 ENCOUNTER — Other Ambulatory Visit: Payer: Self-pay | Admitting: Cardiovascular Disease

## 2018-11-11 ENCOUNTER — Other Ambulatory Visit: Payer: Self-pay | Admitting: Family Medicine

## 2018-11-11 DIAGNOSIS — Z1231 Encounter for screening mammogram for malignant neoplasm of breast: Secondary | ICD-10-CM

## 2018-11-18 DIAGNOSIS — I120 Hypertensive chronic kidney disease with stage 5 chronic kidney disease or end stage renal disease: Secondary | ICD-10-CM | POA: Diagnosis not present

## 2018-11-18 DIAGNOSIS — I701 Atherosclerosis of renal artery: Secondary | ICD-10-CM | POA: Diagnosis not present

## 2018-11-18 DIAGNOSIS — I770 Arteriovenous fistula, acquired: Secondary | ICD-10-CM | POA: Diagnosis not present

## 2018-11-18 DIAGNOSIS — D631 Anemia in chronic kidney disease: Secondary | ICD-10-CM | POA: Diagnosis not present

## 2018-11-18 DIAGNOSIS — N261 Atrophy of kidney (terminal): Secondary | ICD-10-CM | POA: Diagnosis not present

## 2018-11-18 DIAGNOSIS — N185 Chronic kidney disease, stage 5: Secondary | ICD-10-CM | POA: Diagnosis not present

## 2018-11-18 DIAGNOSIS — N2581 Secondary hyperparathyroidism of renal origin: Secondary | ICD-10-CM | POA: Diagnosis not present

## 2018-12-02 DIAGNOSIS — Z0000 Encounter for general adult medical examination without abnormal findings: Secondary | ICD-10-CM | POA: Diagnosis not present

## 2018-12-16 ENCOUNTER — Other Ambulatory Visit: Payer: Self-pay | Admitting: Cardiovascular Disease

## 2018-12-16 MED ORDER — AMLODIPINE BESYLATE 10 MG PO TABS: ORAL_TABLET | ORAL | 6 refills | Status: AC

## 2018-12-18 ENCOUNTER — Ambulatory Visit: Payer: Medicare Other

## 2019-01-02 ENCOUNTER — Other Ambulatory Visit: Payer: Self-pay | Admitting: Cardiovascular Disease

## 2019-01-08 ENCOUNTER — Encounter (INDEPENDENT_AMBULATORY_CARE_PROVIDER_SITE_OTHER): Payer: Self-pay

## 2019-01-08 ENCOUNTER — Ambulatory Visit (INDEPENDENT_AMBULATORY_CARE_PROVIDER_SITE_OTHER): Payer: Medicare Other | Admitting: Cardiovascular Disease

## 2019-01-08 ENCOUNTER — Encounter: Payer: Self-pay | Admitting: Cardiovascular Disease

## 2019-01-08 VITALS — BP 140/60 | HR 66 | Ht 59.0 in | Wt 132.8 lb

## 2019-01-08 DIAGNOSIS — R0989 Other specified symptoms and signs involving the circulatory and respiratory systems: Secondary | ICD-10-CM | POA: Diagnosis not present

## 2019-01-08 DIAGNOSIS — I350 Nonrheumatic aortic (valve) stenosis: Secondary | ICD-10-CM | POA: Diagnosis not present

## 2019-01-08 DIAGNOSIS — I5032 Chronic diastolic (congestive) heart failure: Secondary | ICD-10-CM | POA: Diagnosis not present

## 2019-01-08 NOTE — Progress Notes (Signed)
Cardiology Office Note   Date:  01/08/2019   ID:  [name], DOB [birth year], MRN [id number]  PCP:  Lucianne Lei, MD  Cardiologist:   Mertie Moores, MD   Chief Complaint  Patient presents with  . Congestive Heart Failure  . Hypertension   1. LBBB 2. Diastolic dysfunction 3. Anemia -    4. HTN 5, PVD - renal artery stenosis 6. CKD    Previous notes  [name] is seen back today for a 10 day check. She has a chronic LBBB, remote stress testing in 2005 which was normal, HTN, PVD with past renal artery stenosis, PAF (but with no real documentation of this apparently), iron deficiency anemia, seizure disorder and prior subdural hematoma and a past GI bleed. She has had chronic chest pain and palpitations for many years. Echo back in 2010 showed normal LV function, mild LVH with mild hypertrophy and moderate obstruction of the outflow track.   I saw her earlier this month. BP was quite high. We have adjusted her medicines and updated her echo. She has severe LVH and is felt to have diastolic dysfunction. Hydralazine was increased. She had not taken any of her medicines prior to her last visit with me.   She comes back today. She is here alone. Doing ok. BP still high here today. She brought her cuff in to check. Her readings in the memory are reviewed and for the most part look good. She feels ok and actually says she feels better. Riding her bike at home.   June 23, 2013:  [name] was last seen by me in 2011. She was seen by [name] in March 2014. She is breathing OK. She continues to have problems with anemia and is breathing better since she had her transfusion. No CP . She has white coat HTN. She does not add salt.   She was accompanied by family.   March 16, 2014:  [name] presents for follow up . BP was elevated here but it was high here. BP readings are ok at home.   Oct. 6. 2015:  April 07, 2015: BP is a bit high. Has her son and his daughter living  with her ( getting his house renovated )  Takes her meds regularly,  Sept. 14, 2016:  BP is very elevated this am  Is under stress - has a nephew who is not expected to live much longer (lung cancer)   Oct. 28, 2016:  Doing well.  We added Aldactone at her last visit.  She is feeling much better.  Has some fatigue with walking up stairs.  BP looks great . Avoiding salt .  April 06, 2016:  [name] is seen back for a visit. Was hospitalized with acute renal failure and hyperkalemia. Aldactone and ARB have been held .  Feeling better.  BP is well controlled.   Oct. 24. 2017  BP is up this am  Taking her meds. , has been well.  She is concerned about her HR going up and down . No syncope  February 19, 2017:  [name] is seen today for follow-up of her hypertension BP is ok No CP , no dyspnea.   July 08, 2018: Doing well from a cardiac standpoint. Has a dialysis fistula in her right upper arm    January 08, 2019: [name] is doing well from a cardiac standpoint.  She is had progressive renal insufficiency and has a dialysis fistula in right upper arm. .  Has not started dialysis  yet Last creatinine is 4.3   Breathing is good.   Is watching her salt.     Past Medical History:  Diagnosis Date  . Anxiety   . Asthma   . AVM (arteriovenous malformation) of colon   . CAD (coronary artery disease)   . Cervical polyp   . CHF (congestive heart failure) ([place])   . Chronic kidney disease   . Diverticulosis   . Esophageal stricture   . GERD (gastroesophageal reflux disease)   . Heart murmur   . Hiatal hernia   . History of blood transfusion   . Hypertension   . Iron deficiency anemia   . Osteoarthritis   . Seizures ([place])    " 6 or so years ago" recorded 07/11/18  . Tubular adenoma of colon 2014  . Ventricular hypertrophy     Past Surgical History:  Procedure Laterality Date  . ANGIOPLASTY  1999  . BASCILIC VEIN TRANSPOSITION Right 04/05/2018   Procedure: RIGHT BRACHIAL  VEIN TRANSPOSITION FIRST STAGE;  Surgeon: Conrad Weiser, MD;  Location: [facility];  Service: Vascular;  Laterality: Right;  . BASCILIC VEIN TRANSPOSITION Right 07/15/2018   Procedure: SECOND STAGE BASILIC VEIN TRANSPOSITION RIGHT ARM;  Surgeon: Rosetta Posner, MD;  Location: [facility] OR;  Service: Vascular;  Laterality: Right;  . Bilateral Total Knee Replacements Bilateral    Left Knee-12/29/2003, Right Knee-04/10/2002  . BREAST BIOPSY Right 2015  . BREAST EXCISIONAL BIOPSY Left   . BREAST SURGERY    . CARDIAC CATHETERIZATION    . CARPAL TUNNEL RELEASE    . CATARACT EXTRACTION Bilateral   . COLONOSCOPY    . COLONOSCOPY W/ BIOPSIES    . CORONARY ANGIOPLASTY    . INGUINAL HERNIA REPAIR Right 08/08/2006   RIGHT INGUINAL HERNIA REPAIR WITH MESH  . JOINT REPLACEMENT    . KNEE ARTHROSCOPY    . LAPAROSCOPIC APPENDECTOMY    . LAPAROSCOPIC CHOLECYSTECTOMY    . MAJOR DUCT EXCISION OF LEFT BREAST  06/01/2004  . RIGHT SHOULDER ARTHROSCOPY  12/18/2006  . Right Total Hip Athroplasty  01/23/2002  . TONSILLECTOMY       Current Outpatient Medications  Medication Sig Dispense Refill  . amLODipine (NORVASC) 10 MG tablet TAKE 1 TABLET(10 MG) BY MOUTH DAILY 30 tablet 6  . Carboxymethylcellul-Glycerin (LUBRICATING EYE DROPS OP) Place 1 drop into both eyes daily as needed (dry eyes).    . clopidogrel (PLAVIX) 75 MG tablet Take 75 mg by mouth daily.     Marland Kitchen doxazosin (CARDURA) 8 MG tablet TAKE 1 TABLET(8 MG) BY MOUTH DAILY 30 tablet 7  . furosemide (LASIX) 80 MG tablet Take 80 mg by mouth daily.    . hydrALAZINE (APRESOLINE) 100 MG tablet Take 100 mg by mouth 3 (three) times daily.    Marland Kitchen levETIRAcetam (KEPPRA) 500 MG tablet Take 500 mg by mouth 2 (two) times daily.    Marland Kitchen oxyCODONE-acetaminophen (PERCOCET) 5-325 MG tablet Take 1 tablet by mouth every 6 (six) hours as needed for severe pain. 12 tablet 0  . potassium chloride (K-DUR,KLOR-CON) 10 MEQ tablet TAKE 2 TABLETS(20 MEQ) BY MOUTH TWICE DAILY 180 tablet 3  . sodium  bicarbonate 650 MG tablet Take 650 mg by mouth 2 (two) times daily. 2 tablets twice daily     No current facility-administered medications for this visit.     Allergies:   Haldol [haloperidol decanoate]; Lorazepam; and Cephalexin    Social History:  The patient  reports that she has quit smoking. Her smoking  use included cigarettes. She has never used smokeless tobacco. She reports that she does not drink alcohol or use drugs.   Family History:  The patient's family history includes Breast cancer in her paternal aunt; Cancer in her father and sister; Hypertension in her mother; Other in her brother; Stroke in her mother.    ROS:   Noted in current history, otherwise review of systems is negative.   Physical Exam: Blood pressure 140/60, pulse 66, height 4\' 11"  (1.499 m), weight 132 lb 12.8 oz (60.2 kg).  GEN:   Elderly female, no acute distress HEENT: Normal NECK: No JVD; bilateral carotid bruit, left louder than right LYMPHATICS: No lymphadenopathy CARDIAC: RRR, 2 3/6 systolic ejection murmur the left sternal border RESPIRATORY:  Clear to auscultation without rales, wheezing or rhonchi  ABDOMEN: Soft, non-tender, non-distended MUSCULOSKELETAL: Upper arm dialysis fistula SKIN: Warm and dry NEUROLOGIC:  Alert and oriented x 3    EKG:   January 08, 2019: Normal sinus rhythm at 66.  Left bundle branch block.   Recent Labs: 07/15/2018: Hemoglobin 10.9 10/10/2018: BUN 61; Creatinine, Ser 4.77; Potassium 4.2; Sodium 137    Lipid Panel    Component Value Date/Time   CHOL 228 (H) 09/11/2014 0850   TRIG 211.0 (H) 09/11/2014 0850   HDL 41.50 09/11/2014 0850   CHOLHDL 5 09/11/2014 0850   VLDL 42.2 (H) 09/11/2014 0850   LDLCALC  04/25/2010 0459    67        Total Cholesterol/HDL:CHD Risk Coronary Heart Disease Risk Table                     Men   Women  1/2 Average Risk   3.4   3.3  Average Risk       5.0   4.4  2 X Average Risk   9.6   7.1  3 X Average Risk  23.4   11.0         Use the calculated Patient Ratio above and the CHD Risk Table to determine the patient's CHD Risk.        ATP III CLASSIFICATION (LDL):  <100     mg/dL   Optimal  100-129  mg/dL   Near or Above                    Optimal  130-159  mg/dL   Borderline  160-189  mg/dL   High  >190     mg/dL   Very High   LDLDIRECT 123.2 09/11/2014 0850      Wt Readings from Last 3 Encounters:  01/08/19 132 lb 12.8 oz (60.2 kg)  09/03/18 139 lb 3.2 oz (63.1 kg)  08/13/18 137 lb 4.8 oz (62.3 kg)      Other studies Reviewed: Additional studies/ records that were reviewed today include: . Review of the above records demonstrates:    ASSESSMENT AND PLAN:  1.  Sinus bradycardia.     1. LBBB -   Stable   2. Diastolic dysfunction -   she is to be very stable.  She is not having any shortness of breath.  3. Anemia -     Plans per IM and nephrology   4. HTN-   pressure is well controlled  5.  Carotid artery stenosis: She has mild bilateral carotid disease.  She has bilateral bruits.  We will recheck a duplex scan.  5, PVD -   6. LVH -   7. CKD - stage  5 CKD.        Current medicines are reviewed at length with the patient today.  The patient does not have concerns regarding medicines.  The following changes have been made:  no change  Labs/ tests ordered today include:   Orders Placed This Encounter  Procedures  . EKG 12-Lead     Disposition:   FU with me in 6 months       Mertie Moores, MD  01/08/2019 11:38 AM    [facility] [address], [place], [place]  [address] Phone: [phone]; Fax: [phone]

## 2019-01-08 NOTE — Patient Instructions (Signed)
Medication Instructions:  Your physician recommends that you continue on your current medications as directed. Please refer to the Current Medication list given to you today.  If you need a refill on your cardiac medications before your next appointment, please call your pharmacy.    Lab work: None Ordered    Testing/Procedures: Your physician has requested that you have a carotid duplex. This test is an ultrasound of the carotid arteries in your neck. It looks at blood flow through these arteries that supply the brain with blood. Allow one hour for this exam. There are no restrictions or special instructions.    Follow-Up: At [facility], you and your health needs are our priority.  As part of our continuing mission to provide you with exceptional heart care, we have created designated Provider Care Teams.  These Care Teams include your primary Cardiologist (physician) and Advanced Practice Providers (APPs -  Physician Assistants and Nurse Practitioners) who all work together to provide you with the care you need, when you need it. You will need a follow up appointment in:  6 months.  Please call our office 2 months in advance to schedule this appointment.  You may see Mertie Moores, MD or one of the following Advanced Practice Providers on your designated Care Team: Richardson Dopp, PA-C [place], [place] . Daune Perch, NP

## 2019-01-09 ENCOUNTER — Ambulatory Visit
Admission: RE | Admit: 2019-01-09 | Discharge: 2019-01-09 | Disposition: A | Discharge status: Home / Self Care | Payer: Medicare Other | Source: Ambulatory Visit | Attending: Family Medicine | Admitting: Family Medicine

## 2019-01-09 DIAGNOSIS — Z1231 Encounter for screening mammogram for malignant neoplasm of breast: Secondary | ICD-10-CM | POA: Diagnosis not present

## 2019-01-20 ENCOUNTER — Ambulatory Visit (HOSPITAL_COMMUNITY)
Admission: RE | Admit: 2019-01-20 | Discharge: 2019-01-20 | Disposition: A | Discharge status: Home / Self Care | Payer: Medicare Other | Source: Ambulatory Visit | Attending: Internal Medicine | Admitting: Internal Medicine

## 2019-01-20 DIAGNOSIS — R0989 Other specified symptoms and signs involving the circulatory and respiratory systems: Secondary | ICD-10-CM | POA: Insufficient documentation

## 2019-02-14 DIAGNOSIS — N261 Atrophy of kidney (terminal): Secondary | ICD-10-CM | POA: Diagnosis not present

## 2019-02-14 DIAGNOSIS — D631 Anemia in chronic kidney disease: Secondary | ICD-10-CM | POA: Diagnosis not present

## 2019-02-14 DIAGNOSIS — N185 Chronic kidney disease, stage 5: Secondary | ICD-10-CM | POA: Diagnosis not present

## 2019-02-14 DIAGNOSIS — N2581 Secondary hyperparathyroidism of renal origin: Secondary | ICD-10-CM | POA: Diagnosis not present

## 2019-02-14 DIAGNOSIS — I120 Hypertensive chronic kidney disease with stage 5 chronic kidney disease or end stage renal disease: Secondary | ICD-10-CM | POA: Diagnosis not present

## 2019-02-14 DIAGNOSIS — I701 Atherosclerosis of renal artery: Secondary | ICD-10-CM | POA: Diagnosis not present

## 2019-02-14 DIAGNOSIS — I770 Arteriovenous fistula, acquired: Secondary | ICD-10-CM | POA: Diagnosis not present

## 2019-02-14 DIAGNOSIS — N189 Chronic kidney disease, unspecified: Secondary | ICD-10-CM | POA: Diagnosis not present

## 2019-05-27 DIAGNOSIS — I701 Atherosclerosis of renal artery: Secondary | ICD-10-CM | POA: Diagnosis not present

## 2019-05-27 DIAGNOSIS — N261 Atrophy of kidney (terminal): Secondary | ICD-10-CM | POA: Diagnosis not present

## 2019-05-27 DIAGNOSIS — N189 Chronic kidney disease, unspecified: Secondary | ICD-10-CM | POA: Diagnosis not present

## 2019-05-27 DIAGNOSIS — I120 Hypertensive chronic kidney disease with stage 5 chronic kidney disease or end stage renal disease: Secondary | ICD-10-CM | POA: Diagnosis not present

## 2019-05-27 DIAGNOSIS — D631 Anemia in chronic kidney disease: Secondary | ICD-10-CM | POA: Diagnosis not present

## 2019-05-27 DIAGNOSIS — N185 Chronic kidney disease, stage 5: Secondary | ICD-10-CM | POA: Diagnosis not present

## 2019-05-27 DIAGNOSIS — I770 Arteriovenous fistula, acquired: Secondary | ICD-10-CM | POA: Diagnosis not present

## 2019-05-27 DIAGNOSIS — N2581 Secondary hyperparathyroidism of renal origin: Secondary | ICD-10-CM | POA: Diagnosis not present

## 2019-06-10 NOTE — Discharge Instructions (Signed)

## 2019-06-11 ENCOUNTER — Encounter (HOSPITAL_COMMUNITY)
Admission: RE | Admit: 2019-06-11 | Discharge: 2019-06-11 | Disposition: A | Discharge status: Home / Self Care | Payer: Medicare Other | Source: Ambulatory Visit | Attending: Internal Medicine | Admitting: Internal Medicine

## 2019-06-11 ENCOUNTER — Other Ambulatory Visit: Payer: Self-pay

## 2019-06-11 VITALS — BP 162/50 | HR 68 | Temp 97.7°F | Resp 20

## 2019-06-11 DIAGNOSIS — N185 Chronic kidney disease, stage 5: Secondary | ICD-10-CM

## 2019-06-11 LAB — POCT HEMOGLOBIN-HEMACUE: Hemoglobin: 8.4 g/dL — ABNORMAL LOW (ref 12.0–15.0)

## 2019-06-11 MED ORDER — EPOETIN ALFA-EPBX 10000 UNIT/ML IJ SOLN
20000.0000 [IU] | INTRAMUSCULAR | Status: DC
  Administered 2019-06-11: 20000 [IU] via SUBCUTANEOUS
  Filled 2019-06-11: qty 2

## 2019-06-25 ENCOUNTER — Other Ambulatory Visit: Payer: Self-pay

## 2019-06-25 ENCOUNTER — Encounter (HOSPITAL_COMMUNITY)
Admission: RE | Admit: 2019-06-25 | Discharge: 2019-06-25 | Disposition: A | Discharge status: Home / Self Care | Payer: Medicare Other | Source: Ambulatory Visit | Attending: Internal Medicine | Admitting: Internal Medicine

## 2019-06-25 VITALS — BP 176/55 | HR 71 | Temp 96.8°F | Resp 20

## 2019-06-25 DIAGNOSIS — N185 Chronic kidney disease, stage 5: Secondary | ICD-10-CM

## 2019-06-25 LAB — POCT HEMOGLOBIN-HEMACUE: Hemoglobin: 9 g/dL — ABNORMAL LOW (ref 12.0–15.0)

## 2019-06-25 MED ORDER — EPOETIN ALFA-EPBX 10000 UNIT/ML IJ SOLN
20000.0000 [IU] | INTRAMUSCULAR | Status: DC
  Administered 2019-06-25: 20000 [IU] via SUBCUTANEOUS
  Filled 2019-06-25: qty 2

## 2019-07-09 ENCOUNTER — Ambulatory Visit (HOSPITAL_COMMUNITY)
Admission: RE | Admit: 2019-07-09 | Discharge: 2019-07-09 | Disposition: A | Discharge status: Home / Self Care | Payer: Medicare Other | Source: Ambulatory Visit | Attending: Internal Medicine | Admitting: Internal Medicine

## 2019-07-09 VITALS — BP 156/64 | HR 72 | Temp 97.3°F | Resp 20

## 2019-07-09 DIAGNOSIS — N185 Chronic kidney disease, stage 5: Secondary | ICD-10-CM | POA: Diagnosis not present

## 2019-07-09 LAB — IRON AND TIBC
Iron: 69 ug/dL (ref 28–170)
Saturation Ratios: 26 % (ref 10.4–31.8)
TIBC: 260 ug/dL (ref 250–450)
UIBC: 191 ug/dL

## 2019-07-09 LAB — POCT HEMOGLOBIN-HEMACUE: Hemoglobin: 10.6 g/dL — ABNORMAL LOW (ref 12.0–15.0)

## 2019-07-09 LAB — FERRITIN: Ferritin: 77 ng/mL (ref 11–307)

## 2019-07-09 MED ORDER — EPOETIN ALFA-EPBX 10000 UNIT/ML IJ SOLN
20000.0000 [IU] | INTRAMUSCULAR | Status: DC
  Administered 2019-07-09: 20000 [IU] via SUBCUTANEOUS
  Filled 2019-07-09: qty 2

## 2019-07-23 ENCOUNTER — Ambulatory Visit (HOSPITAL_COMMUNITY)
Admission: RE | Admit: 2019-07-23 | Discharge: 2019-07-23 | Disposition: A | Discharge status: Home / Self Care | Payer: Medicare Other | Source: Ambulatory Visit | Attending: Internal Medicine | Admitting: Internal Medicine

## 2019-07-23 ENCOUNTER — Other Ambulatory Visit: Payer: Self-pay

## 2019-07-23 VITALS — BP 157/61 | HR 74 | Temp 97.2°F | Resp 20

## 2019-07-23 DIAGNOSIS — N185 Chronic kidney disease, stage 5: Secondary | ICD-10-CM | POA: Diagnosis not present

## 2019-07-23 LAB — POCT HEMOGLOBIN-HEMACUE: Hemoglobin: 12.2 g/dL (ref 12.0–15.0)

## 2019-07-23 MED ORDER — EPOETIN ALFA-EPBX 10000 UNIT/ML IJ SOLN
20000.0000 [IU] | INTRAMUSCULAR | Status: DC
  Filled 2019-07-23: qty 2

## 2019-07-25 ENCOUNTER — Other Ambulatory Visit: Payer: Self-pay

## 2019-07-25 ENCOUNTER — Emergency Department (HOSPITAL_COMMUNITY)
Admission: EM | Admit: 2019-07-25 | Discharge: 2019-07-26 | Disposition: A | Discharge status: Home / Self Care | Payer: Medicare Other | Attending: Emergency Medicine | Admitting: Emergency Medicine

## 2019-07-25 ENCOUNTER — Encounter (HOSPITAL_COMMUNITY): Payer: Self-pay

## 2019-07-25 DIAGNOSIS — Z79899 Other long term (current) drug therapy: Secondary | ICD-10-CM | POA: Insufficient documentation

## 2019-07-25 DIAGNOSIS — I5032 Chronic diastolic (congestive) heart failure: Secondary | ICD-10-CM | POA: Diagnosis not present

## 2019-07-25 DIAGNOSIS — I132 Hypertensive heart and chronic kidney disease with heart failure and with stage 5 chronic kidney disease, or end stage renal disease: Secondary | ICD-10-CM | POA: Insufficient documentation

## 2019-07-25 DIAGNOSIS — J45909 Unspecified asthma, uncomplicated: Secondary | ICD-10-CM | POA: Insufficient documentation

## 2019-07-25 DIAGNOSIS — M545 Low back pain, unspecified: Secondary | ICD-10-CM

## 2019-07-25 DIAGNOSIS — M1612 Unilateral primary osteoarthritis, left hip: Secondary | ICD-10-CM | POA: Diagnosis not present

## 2019-07-25 DIAGNOSIS — I2510 Atherosclerotic heart disease of native coronary artery without angina pectoris: Secondary | ICD-10-CM | POA: Insufficient documentation

## 2019-07-25 DIAGNOSIS — Z87891 Personal history of nicotine dependence: Secondary | ICD-10-CM | POA: Insufficient documentation

## 2019-07-25 DIAGNOSIS — N185 Chronic kidney disease, stage 5: Secondary | ICD-10-CM | POA: Insufficient documentation

## 2019-07-25 DIAGNOSIS — G40909 Epilepsy, unspecified, not intractable, without status epilepticus: Secondary | ICD-10-CM | POA: Insufficient documentation

## 2019-07-25 DIAGNOSIS — I120 Hypertensive chronic kidney disease with stage 5 chronic kidney disease or end stage renal disease: Secondary | ICD-10-CM | POA: Diagnosis not present

## 2019-07-25 LAB — BASIC METABOLIC PANEL
Anion gap: 11 (ref 5–15)
BUN: 47 mg/dL — ABNORMAL HIGH (ref 8–23)
CO2: 18 mmol/L — ABNORMAL LOW (ref 22–32)
Calcium: 10 mg/dL (ref 8.9–10.3)
Chloride: 113 mmol/L — ABNORMAL HIGH (ref 98–111)
Creatinine, Ser: 6.4 mg/dL — ABNORMAL HIGH (ref 0.44–1.00)
GFR calc Af Amer: 6 mL/min — ABNORMAL LOW (ref >60)
GFR calc non Af Amer: 5 mL/min — ABNORMAL LOW (ref >60)
Glucose, Bld: 115 mg/dL — ABNORMAL HIGH (ref 70–99)
Potassium: 5.4 mmol/L — ABNORMAL HIGH (ref 3.5–5.1)
Sodium: 142 mmol/L (ref 135–145)

## 2019-07-25 LAB — CBC
HCT: 38.6 % (ref 36.0–46.0)
Hemoglobin: 11.8 g/dL — ABNORMAL LOW (ref 12.0–15.0)
MCH: 28.3 pg (ref 26.0–34.0)
MCHC: 30.6 g/dL (ref 30.0–36.0)
MCV: 92.6 fL (ref 80.0–100.0)
Platelets: 215 10*3/uL (ref 150–400)
RBC: 4.17 MIL/uL (ref 3.87–5.11)
RDW: 14.1 % (ref 11.5–15.5)
WBC: 4.4 10*3/uL (ref 4.0–10.5)
nRBC: 0 % (ref 0.0–0.2)

## 2019-07-25 NOTE — ED Triage Notes (Signed)
Pt reports lower back and left hip pain since this weekend, denies any other symptoms.

## 2019-07-26 ENCOUNTER — Emergency Department (HOSPITAL_COMMUNITY): Payer: Medicare Other

## 2019-07-26 DIAGNOSIS — M545 Low back pain: Secondary | ICD-10-CM | POA: Diagnosis not present

## 2019-07-26 DIAGNOSIS — M1612 Unilateral primary osteoarthritis, left hip: Secondary | ICD-10-CM | POA: Diagnosis not present

## 2019-07-26 LAB — URINALYSIS, ROUTINE W REFLEX MICROSCOPIC
Bacteria, UA: NONE SEEN
Bilirubin Urine: NEGATIVE
Glucose, UA: 50 mg/dL — AB
Hgb urine dipstick: NEGATIVE
Ketones, ur: 5 mg/dL — AB
Nitrite: NEGATIVE
Protein, ur: >=300 mg/dL — AB
Specific Gravity, Urine: 1.016 (ref 1.005–1.030)
pH: 7 (ref 5.0–8.0)

## 2019-07-26 MED ORDER — AMLODIPINE BESYLATE 5 MG PO TABS
10.0000 mg | ORAL_TABLET | Freq: Once | ORAL | Status: AC
  Administered 2019-07-26: 10 mg via ORAL
  Filled 2019-07-26: qty 2

## 2019-07-26 MED ORDER — FUROSEMIDE 20 MG PO TABS
80.0000 mg | ORAL_TABLET | Freq: Once | ORAL | Status: AC
  Administered 2019-07-26: 80 mg via ORAL
  Filled 2019-07-26: qty 4

## 2019-07-26 MED ORDER — ACETAMINOPHEN 500 MG PO TABS
1000.0000 mg | ORAL_TABLET | Freq: Once | ORAL | Status: AC
  Administered 2019-07-26: 1000 mg via ORAL
  Filled 2019-07-26: qty 2

## 2019-07-26 MED ORDER — HYDROCODONE-ACETAMINOPHEN 5-325 MG PO TABS: 1.0000 | ORAL_TABLET | ORAL | 0 refills | Status: AC | PRN

## 2019-07-26 MED ORDER — HYDROCODONE-ACETAMINOPHEN 5-325 MG PO TABS: 1.0000 | ORAL_TABLET | ORAL | 0 refills | Status: DC | PRN

## 2019-07-26 MED ORDER — HYDRALAZINE HCL 25 MG PO TABS
100.0000 mg | ORAL_TABLET | Freq: Once | ORAL | Status: AC
  Administered 2019-07-26: 100 mg via ORAL
  Filled 2019-07-26: qty 4

## 2019-07-26 NOTE — ED Provider Notes (Signed)
Patient was discussed with me by previous provider, PA Sanders.  See her note for full HPI and work-up.  Patient presenting for back pain, and was ready for discharge with symptomatic management.  She was noted to be hypertensive throughout ED stay, and upon discharge was noted to remain hypertensive.  She was given her home doses of blood pressure medications with plan to reevaluate with anticipated discharge.  Patient's blood pressures have slowly trended down, however remain around 200/70.  This was discussed with Dr. Ronnald Nian.  I had shared decision making with patient regarding hypertension.  She has no headache, vision changes, chest pain, or other symptoms concerning for hypertensive emergency.  She states her back is still somewhat bothering her, however is overall improved.  She states her blood pressure does go up when she is uncomfortable and having pain.  Do believe her elevated blood pressure is multifactorial, given her history of CKD.  With shared decision making, patient will be discharged with instructions to monitor her blood pressure at home, and follow-up closely with her PCP and nephrologist.  She feels comfortable with this plan.  She directed to return to the ED should she develop any new or worsening symptoms. Pt discharged.   [name], [place] N, PA-C 07/26/19 [place], [place], DO 07/26/19 2158

## 2019-07-26 NOTE — ED Provider Notes (Signed)
[facility]ERGENC[facility] DEPARTMENT Provider Note   CSN: [id number] Arrival date & time: 07/25/19  1757     History   Chief Complaint Chief Complaint  Patient presents with  . Back Pain  . Hip Pain    HPI [name] is a 85 y.o. female.     The history is provided by the patient and medical records.  Back Pain Hip Pain     85 y.o. F with hx of anxiety, asthma, CAD, CHF, stage V CKD not yet on HD, HTN, Fe+ deficiency anemia, seizures, presenting to the ED for left lower back pain for the past week.  States she has recently been staying with her daughter and sleeping on a different mattress than normal.  States pain is in her left lower back and left buttock.  No radiation down the leg.  No numbness/weakness of the legs.  No bowel or bladder incontinence.  No fevers or urinary symptoms.  States she has not taken any medications for her symptoms thus far, she states "I already take too much medication".  She does use a cane intermittently.  Denies recent falls or trauma.  Past Medical History:  Diagnosis Date  . Anxiety   . Asthma   . AVM (arteriovenous malformation) of colon   . CAD (coronary artery disease)   . Cervical polyp   . CHF (congestive heart failure) ([place])   . Chronic kidney disease   . Diverticulosis   . Esophageal stricture   . GERD (gastroesophageal reflux disease)   . Heart murmur   . Hiatal hernia   . History of blood transfusion   . Hypertension   . Iron deficiency anemia   . Osteoarthritis   . Seizures ([place])    " 6 or so years ago" recorded 07/11/18  . Tubular adenoma of colon 2014  . Ventricular hypertrophy     Patient Active Problem List   Diagnosis Date Noted  . CKD (chronic kidney disease), stage V ([place]) 01/04/2018  . Hypertensive urgency 11/05/2017  . Chronic diastolic CHF (congestive heart failure) ([place]) 10/19/2017  . Acute kidney injury superimposed on chronic kidney disease ([place]) 10/06/2016  . Metabolic acidosis  16/38/4665  . Dehydration with hyponatremia 10/06/2016  . Sinus bradycardia 10/03/2016  . Hypokalemia 12/10/2015  . DNR (do not resuscitate)   . Palliative care encounter   . Weakness   . Hyperkalemia 12/09/2015  . Hypernatremia 12/09/2015  . Troponin level elevated 12/09/2015  . Anemia, iron deficiency 12/09/2015  . Coronary artery disease involving native coronary artery of native heart without angina pectoris 12/09/2015  . Nausea & vomiting 12/08/2015  . Seizure disorder ([place]) 02/05/2008  . Essential hypertension 06/22/2007    Past Surgical History:  Procedure Laterality Date  . ANGIOPLASTY  1999  . BASCILIC VEIN TRANSPOSITION Right 04/05/2018   Procedure: RIGHT BRACHIAL VEIN TRANSPOSITION FIRST STAGE;  Surgeon: Conrad Bernville, MD;  Location: [facility];  Service: Vascular;  Laterality: Right;  . BASCILIC VEIN TRANSPOSITION Right 07/15/2018   Procedure: SECOND STAGE BASILIC VEIN TRANSPOSITION RIGHT ARM;  Surgeon: Rosetta Posner, MD;  Location: [facility] OR;  Service: Vascular;  Laterality: Right;  . Bilateral Total Knee Replacements Bilateral    Left Knee-12/29/2003, Right Knee-04/10/2002  . BREAST BIOPSY Right 2015  . BREAST EXCISIONAL BIOPSY Left   . BREAST SURGERY    . CARDIAC CATHETERIZATION    . CARPAL TUNNEL RELEASE    . CATARACT EXTRACTION Bilateral   . COLONOSCOPY    .  COLONOSCOPY W/ BIOPSIES    . CORONARY ANGIOPLASTY    . INGUINAL HERNIA REPAIR Right 08/08/2006   RIGHT INGUINAL HERNIA REPAIR WITH MESH  . JOINT REPLACEMENT    . KNEE ARTHROSCOPY    . LAPAROSCOPIC APPENDECTOMY    . LAPAROSCOPIC CHOLECYSTECTOMY    . MAJOR DUCT EXCISION OF LEFT BREAST  06/01/2004  . RIGHT SHOULDER ARTHROSCOPY  12/18/2006  . Right Total Hip Athroplasty  01/23/2002  . TONSILLECTOMY       OB History   No obstetric history on file.      Home Medications    Prior to Admission medications   Medication Sig Start Date End Date Taking? Authorizing Provider  amLODipine (NORVASC) 10 MG tablet TAKE 1  TABLET(10 MG) BY MOUTH DAILY 12/16/18   Nahser, Wonda Cheng, MD  calcitRIOL (ROCALTROL) 0.25 MCG capsule Take 0.25 mcg by mouth daily.    [provider]  Carboxymethylcellul-Glycerin (LUBRICATING EYE DROPS OP) Place 1 drop into both eyes daily as needed (dry eyes).    [provider]  clopidogrel (PLAVIX) 75 MG tablet Take 75 mg by mouth daily.  08/21/14   [provider]  doxazosin (CARDURA) 8 MG tablet TAKE 1 TABLET(8 MG) BY MOUTH DAILY 10/30/18   Nahser, Wonda Cheng, MD  furosemide (LASIX) 80 MG tablet Take 80 mg by mouth daily.    [provider]  hydrALAZINE (APRESOLINE) 100 MG tablet Take 100 mg by mouth 3 (three) times daily.    [provider]  levETIRAcetam (KEPPRA) 500 MG tablet Take 500 mg by mouth 2 (two) times daily.    [provider]  oxyCODONE-acetaminophen (PERCOCET) 5-325 MG tablet Take 1 tablet by mouth every 6 (six) hours as needed for severe pain. 07/15/18   Rhyne, Hulen Shouts, PA-C  potassium chloride (K-DUR,KLOR-CON) 10 MEQ tablet TAKE 2 TABLETS(20 MEQ) BY MOUTH TWICE DAILY 01/02/19   Nahser, Wonda Cheng, MD  sodium bicarbonate 650 MG tablet Take 650 mg by mouth 2 (two) times daily. 2 tablets twice daily    [provider]    Family History Family History  Problem Relation Age of Onset  . Hypertension Mother   . Stroke Mother   . Cancer Father   . Cancer Sister   . Other Brother        brain tumor  . Breast cancer Paternal Aunt        in 88's or 68's    Social History Social History   Tobacco Use  . Smoking status: Former Smoker    Types: Cigarettes  . Smokeless tobacco: Never Used  . Tobacco comment: "quit years ago"     Substance Use Topics  . Alcohol use: No  . Drug use: No     Allergies   Haldol [haloperidol decanoate], Lorazepam, and Cephalexin   Review of Systems Review of Systems  Musculoskeletal: Positive for back pain.  All other systems reviewed and are negative.    Physical Exam  Updated Vital Signs BP (!) 190/55   Pulse 72   Temp 97.8 F (36.6 C) (Oral)   Resp 18   SpO2 98%   Physical Exam Vitals signs and nursing note reviewed.  Constitutional:      Appearance: She is well-developed.  HENT:     Head: Normocephalic and atraumatic.  Eyes:     Conjunctiva/sclera: Conjunctivae normal.     Pupils: Pupils are equal, round, and reactive to light.  Neck:     Musculoskeletal: Normal range of motion.  Cardiovascular:     Rate and Rhythm: Normal rate and regular rhythm.     Heart sounds: Normal heart sounds.  Pulmonary:     Effort: Pulmonary effort is normal.     Breath sounds: Normal breath sounds. No wheezing or rhonchi.  Abdominal:     General: Bowel sounds are normal.     Palpations: Abdomen is soft.  Musculoskeletal: Normal range of motion.     Comments: Left SI joint is TTP without noted deformity; no signs of trauma, pelvis is non-tender, no leg shortening or malrotation; DP pulses intact, normal distal sensation and perfusion Pelvis stable, no leg shortening or malrotation  Skin:    General: Skin is warm and dry.  Neurological:     Mental Status: She is alert and oriented to person, place, and time.      ED Treatments / Results  Labs (all labs ordered are listed, but only abnormal results are displayed) Labs Reviewed  BASIC METABOLIC PANEL - Abnormal; Notable for the following components:      Result Value   Potassium 5.4 (*)    Chloride 113 (*)    CO2 18 (*)    Glucose, Bld 115 (*)    BUN 47 (*)    Creatinine, Ser 6.40 (*)    GFR calc non Af Amer 5 (*)    GFR calc Af Amer 6 (*)    All other components within normal limits  CBC - Abnormal; Notable for the following components:   Hemoglobin 11.8 (*)    All other components within normal limits  URINALYSIS, ROUTINE W REFLEX MICROSCOPIC    EKG EKG Interpretation  Date/Time:  Saturday July 26 2019 03:46:17 EDT Ventricular Rate:  73 PR Interval:    QRS Duration: 151 QT Interval:   456 QTC Calculation: 510 R Axis:   27 Text Interpretation:  Sinus rhythm with first degree AV block Ventricular premature complex Left bundle branch block No significant change since last tracing Confirmed by Pryor Curia [phone]) on 07/26/2019 3:54:14 AM   Radiology Dg Lumbar Spine Complete  Result Date: 07/26/2019 CLINICAL DATA:  Worsening low back pain for 1 week. No known injury. EXAM: LUMBAR SPINE - COMPLETE 4+ VIEW COMPARISON:  None. FINDINGS: There is no evidence of lumbar spine fracture. Alignment is normal. Moderate to severe degenerative disc disease is seen at L5-S1, as well as lower lumbar facet DJD. Generalized osteopenia. No focal lytic or sclerotic bone lesions identified. Extensive aortic atherosclerotic calcification noted, as well as small calcified fibroid in central pelvis. IMPRESSION: No acute findings. Degenerative spondylosis, as described above. Osteopenia. Electronically Signed   By: Marlaine Hind M.D.   On: 07/26/2019 04:44   Dg Pelvis 1-2 Views  Result Date: 07/26/2019 CLINICAL DATA:  Left hip and pelvic pain. EXAM: PELVIS - 1-2 VIEW COMPARISON:  None. FINDINGS: Generalized osteopenia. No evidence of pelvic fracture or diastasis. Suboptimal evaluation of the left hip on this single view exam, however there is no definite evidence of left hip fracture. No evidence of dislocation. Moderate to severe left hip osteoarthritis is seen. Incidental findings include a right hip prosthesis, extensive bilateral iliac and femoral atherosclerotic calcification, and a small calcified fibroid in the central pelvis. IMPRESSION: 1. No acute findings. 2. Moderate to severe left hip osteoarthritis. 3. Osteopenia. Electronically Signed   By: Marlaine Hind M.D.   On: 07/26/2019 04:41    Procedures Procedures (including critical care time)  Medications Ordered in ED Medications  acetaminophen (TYLENOL) tablet 1,000 mg (  1,000 mg Oral Given 07/26/19 0335)  hydrALAZINE (APRESOLINE) tablet 100 mg  (100 mg Oral Given 07/26/19 0334)     Initial Impression / Assessment and Plan / ED Course  I have reviewed the triage vital signs and the nursing notes.  Pertinent labs & imaging results that were available during my care of the patient were reviewed by me and considered in my medical decision making (see chart for details).  85 year old female presenting to the ED with left-sided back pain.  She is been staying with her daughter recently due to multiple doctors appointments and needing help getting there and back.  States since this time her back has been bothering her, more so left lower.  She denies any falls or trauma.  She is not had any numbness or weakness of her legs.  No bowel or bladder incontinence.  She intermittently uses a cane, has been relying on this more than normal recently.  Here she is awake, alert, appropriately oriented.  She does have tenderness of the left SI joint but no acute deformity or visible signs of trauma.  She is not having any associated abdominal pain.  No signs/symptoms concerning for cauda equina.  Screening labs obtained from triage with chronic kidney disease.  She has new fistula in right arm that has not yet matured so has not started hemodialysis at this time.  Her blood pressure is also elevated, however has not had her nighttime dose of hydralazine yet.  We will plan to obtain films of back, obtain EKG, and give home dose of hydralazine.  We will recheck her blood pressure.  5:31 AM Films negative.  I ambulated with patient myself, she was able to get out of bed independently and ambulate in room with her cane which is baseline. States she has some discomfort but tolerable.   Remains without focal neurologic deficits.  BP improved after home hydralazine, BP 170/84 on re-check.  Patient appears stable for discharge.  She has been staying with her daughter recently, she will continue to stay with her for now until back is feeling better.  Continue to use cane  when ambulating for stability.  Will need close follow-up with PCP.  Return here for any new/acute changes.  Patient seen and evaluated with attending physician, Dr. Leonides Schanz, who agrees with assessment and plan of care.  Final Clinical Impressions(s) / ED Diagnoses   Final diagnoses:  Acute left-sided low back pain without sciatica  CKD (chronic kidney disease), stage V [facility])    ED Discharge Orders         Ordered    HYDROcodone-acetaminophen (NORCO/VICODIN) 5-325 MG tablet  Every 4 hours PRN     07/26/19 [place], [place], PA-C 07/26/19 0608    Ward, Delice Bison, DO 07/26/19 [phone]

## 2019-07-26 NOTE — Discharge Instructions (Addendum)
Take pain medication as prescribed.  Use caution, this can make you sleepy/drowsy. I recommend that you use your cane when walking, at least until your back is feeling better. Follow-up with your primary care doctor. Return to the ED for new or worsening symptoms.

## 2019-07-26 NOTE — ED Notes (Signed)
RN notifed PA of SBP in 200s

## 2019-07-26 NOTE — ED Notes (Signed)
RN reiterated D/C paperwork and to monitor BP

## 2019-07-26 NOTE — ED Notes (Signed)
RN informed MD that Pt's BP is still elevated at SBP in 200s

## 2019-07-28 ENCOUNTER — Other Ambulatory Visit: Payer: Self-pay | Admitting: Cardiovascular Disease

## 2019-07-31 ENCOUNTER — Ambulatory Visit (INDEPENDENT_AMBULATORY_CARE_PROVIDER_SITE_OTHER): Payer: Medicare Other | Admitting: Cardiology

## 2019-07-31 ENCOUNTER — Encounter: Payer: Self-pay | Admitting: Cardiology

## 2019-07-31 ENCOUNTER — Other Ambulatory Visit: Payer: Self-pay

## 2019-07-31 VITALS — BP 140/60 | HR 80 | Ht 59.0 in | Wt 122.8 lb

## 2019-07-31 DIAGNOSIS — I10 Essential (primary) hypertension: Secondary | ICD-10-CM

## 2019-07-31 DIAGNOSIS — I5032 Chronic diastolic (congestive) heart failure: Secondary | ICD-10-CM

## 2019-07-31 DIAGNOSIS — N185 Chronic kidney disease, stage 5: Secondary | ICD-10-CM | POA: Diagnosis not present

## 2019-07-31 DIAGNOSIS — I6523 Occlusion and stenosis of bilateral carotid arteries: Secondary | ICD-10-CM | POA: Diagnosis not present

## 2019-07-31 NOTE — Progress Notes (Signed)
Cardiology Office Note:    Date:  07/31/2019   ID:  [name], DOB 05/15/1930, MRN [id number]  PCP:  Lucianne Lei, MD  Cardiologist:  Mertie Moores, MD  Referring MD: Lucianne Lei, MD   Chief Complaint  Patient presents with  . Follow-up  . Hypertension  . Coronary Artery Disease    History of Present Illness:    [name] is a 85 y.o. female with a past medical history significant for chronic LBBB, hypertension, CKD stage V PVD with past renal artery stenosis, PAF (apparently no documentation), mild bilateral carotid artery disease, iron deficiency anemia, seizure disorder, prior subdural hematoma and past GI bleed.  CAD, appears to have had remote angioplasty, ?1999.  The patient had remote normal stress test in 2005.  She has had chronic chest pain and palpitations for many years.  Echo back in 2010 showed normal LV function, mild LVH and moderate obstruction of the outflow tract.  She has been followed by [name] since at least 2014.  Main goal of therapy has been blood pressure control.  The patient was last seen in the office on 01/08/2019 by [name] who noted that the patient was doing well from a cardiac standpoint.  She had progressive renal insufficiency and has had a dialysis fistula placed in her right upper arm.  Her blood pressure was well controlled.  The patient was recently seen in the ED on 07/25/2019 for back pain and was noted to have significantly elevated blood pressure.  Blood pressure improved with her home dose of hydralazine and pain med and she was discharged from the ED.  Ms Doffing is here She does an injection every 2 weeks through nephrology (procrit). She says she is feeling better since that. Her HGb has increased from 8 to 12. She reports that she previously had fast/hard heart beat with exertion, but this is resolved now. She says that her breathing is OK. She appeared a little short of breath with walking in with her walker, but she recovered  quickly. No orthopnea, PND or edema. No chest pain/pressure.  She feels like she is doing well.  Past Medical History:  Diagnosis Date  . Anxiety   . Asthma   . AVM (arteriovenous malformation) of colon   . CAD (coronary artery disease)   . Cervical polyp   . CHF (congestive heart failure) ([place])   . Chronic kidney disease   . Diverticulosis   . Esophageal stricture   . GERD (gastroesophageal reflux disease)   . Heart murmur   . Hiatal hernia   . History of blood transfusion   . Hypertension   . Iron deficiency anemia   . Osteoarthritis   . Seizures ([place])    " 6 or so years ago" recorded 07/11/18  . Tubular adenoma of colon 2014  . Ventricular hypertrophy     Past Surgical History:  Procedure Laterality Date  . ANGIOPLASTY  1999  . BASCILIC VEIN TRANSPOSITION Right 04/05/2018   Procedure: RIGHT BRACHIAL VEIN TRANSPOSITION FIRST STAGE;  Surgeon: Conrad [place], MD;  Location: [facility];  Service: Vascular;  Laterality: Right;  . BASCILIC VEIN TRANSPOSITION Right 07/15/2018   Procedure: SECOND STAGE BASILIC VEIN TRANSPOSITION RIGHT ARM;  Surgeon: Rosetta Posner, MD;  Location: [facility];  Service: Vascular;  Laterality: Right;  . Bilateral Total Knee Replacements Bilateral    Left Knee-12/29/2003, Right Knee-04/10/2002  . BREAST BIOPSY Right 2015  . BREAST EXCISIONAL BIOPSY Left   . BREAST SURGERY    .  CARDIAC CATHETERIZATION    . CARPAL TUNNEL RELEASE    . CATARACT EXTRACTION Bilateral   . COLONOSCOPY    . COLONOSCOPY W/ BIOPSIES    . CORONARY ANGIOPLASTY    . INGUINAL HERNIA REPAIR Right 08/08/2006   RIGHT INGUINAL HERNIA REPAIR WITH MESH  . JOINT REPLACEMENT    . KNEE ARTHROSCOPY    . LAPAROSCOPIC APPENDECTOMY    . LAPAROSCOPIC CHOLECYSTECTOMY    . MAJOR DUCT EXCISION OF LEFT BREAST  06/01/2004  . RIGHT SHOULDER ARTHROSCOPY  12/18/2006  . Right Total Hip Athroplasty  01/23/2002  . TONSILLECTOMY      Current Medications: Current Meds  Medication Sig  . amLODipine (NORVASC) 10 MG  tablet TAKE 1 TABLET(10 MG) BY MOUTH DAILY  . calcitRIOL (ROCALTROL) 0.25 MCG capsule Take 0.25 mcg by mouth daily.  . Carboxymethylcellul-Glycerin (LUBRICATING EYE DROPS OP) Place 1 drop into both eyes daily as needed (dry eyes).  . clopidogrel (PLAVIX) 75 MG tablet Take 75 mg by mouth daily.   Marland Kitchen doxazosin (CARDURA) 8 MG tablet TAKE 1 TABLET(8 MG) BY MOUTH DAILY  . furosemide (LASIX) 80 MG tablet Take 80 mg by mouth daily.  . hydrALAZINE (APRESOLINE) 100 MG tablet Take 100 mg by mouth 3 (three) times daily.  Marland Kitchen HYDROcodone-acetaminophen (NORCO/VICODIN) 5-325 MG tablet Take 1 tablet by mouth every 4 (four) hours as needed.  . levETIRAcetam (KEPPRA) 500 MG tablet Take 500 mg by mouth 2 (two) times daily.  Marland Kitchen oxyCODONE-acetaminophen (PERCOCET) 5-325 MG tablet Take 1 tablet by mouth every 6 (six) hours as needed for severe pain.  . potassium chloride (K-DUR,KLOR-CON) 10 MEQ tablet TAKE 2 TABLETS(20 MEQ) BY MOUTH TWICE DAILY  . sodium bicarbonate 650 MG tablet Take 650 mg by mouth 2 (two) times daily. 2 tablets twice daily     Allergies:   Haldol [haloperidol decanoate], Lorazepam, and Cephalexin   Social History   Socioeconomic History  . Marital status: Widowed    Spouse name: Not on file  . Number of children: Not on file  . Years of education: Not on file  . Highest education level: Not on file  Occupational History  . Not on file  Social Needs  . Financial resource strain: Not on file  . Food insecurity    Worry: Not on file    Inability: Not on file  . Transportation needs    Medical: Not on file    Non-medical: Not on file  Tobacco Use  . Smoking status: Former Smoker    Types: Cigarettes  . Smokeless tobacco: Never Used  . Tobacco comment: "quit years ago"     Substance and Sexual Activity  . Alcohol use: No  . Drug use: No  . Sexual activity: Never  Lifestyle  . Physical activity    Days per week: Not on file    Minutes per session: Not on file  . Stress: Not on file   Relationships  . Social Herbalist on phone: Not on file    Gets together: Not on file    Attends religious service: Not on file    Active member of club or organization: Not on file    Attends meetings of clubs or organizations: Not on file    Relationship status: Not on file  Other Topics Concern  . Not on file  Social History Narrative  . Not on file     Family History: The patient's family history includes Breast cancer in her paternal  aunt; Cancer in her father and sister; Hypertension in her mother; Other in her brother; Stroke in her mother. ROS:   Please see the history of present illness.     All other systems reviewed and are negative.  EKGs/Labs/Other Studies Reviewed:    The following studies were reviewed today:  Carotid ultrasound 01/20/2019 Summary: Right Carotid: Velocities in the right ICA are consistent with a 1-39% stenosis.                Non-hemodynamically significant plaque <50% noted in the CCA. The                RICA velocities remain within normal range and have increased                compared to the prior exam, possibly due to vessel tortuosity.  Left Carotid: Velocities in the left ICA are consistent with a 1-39% stenosis.               Non-hemodynamically significant plaque noted in the CCA. The LICA               velocities remain within normal range and stable compared to the               prior exam.  Vertebrals:  Bilateral vertebral arteries demonstrate antegrade flow.   Subclavians: Right subclavian artery flow was disturbed. Normal flow              hemodynamics were seen in the left subclavian artery.  Echocardiogram 12/24/2015 Study Conclusions - Left ventricle: The cavity size was normal. There was mild   hypertrophy of the posterior wall and severe hypertrophy of the   septal wall. Systolic function was normal. The estimated ejection   fraction was in the range of 60% to 65%. Wall motion was normal;   there were no  regional wall motion abnormalities. Doppler   parameters are consistent with abnormal left ventricular   relaxation (grade 1 diastolic dysfunction). Doppler parameters   are consistent with high ventricular filling pressure. - Aortic valve: Valve area (VTI): 2.74 cm^2. Valve area (Vmax):   2.25 cm^2. Valve area (Vmean): 2.53 cm^2. - Mitral valve: There was trivial regurgitation. - Left atrium: The atrium was moderately dilated. - Right ventricle: The cavity size was normal. Wall thickness was   normal. Systolic function was normal. - Tricuspid valve: There was trivial regurgitation. - Inferior vena cava: The vessel was normal in size. The   respirophasic diameter changes were in the normal range (= 50%),   consistent with normal central venous pressure.   EKG:  EKG is not ordered today.    Recent Labs: 07/25/2019: BUN 47; Creatinine, Ser 6.40; Hemoglobin 11.8; Platelets 215; Potassium 5.4; Sodium 142   Recent Lipid Panel    Component Value Date/Time   CHOL 228 (H) 09/11/2014 0850   TRIG 211.0 (H) 09/11/2014 0850   HDL 41.50 09/11/2014 0850   CHOLHDL 5 09/11/2014 0850   VLDL 42.2 (H) 09/11/2014 0850   LDLCALC  04/25/2010 0459    67        Total Cholesterol/HDL:CHD Risk Coronary Heart Disease Risk Table                     Men   Women  1/2 Average Risk   3.4   3.3  Average Risk       5.0   4.4  2 X Average Risk   9.6   7.1  3 X Average Risk  23.4   11.0        Use the calculated Patient Ratio above and the CHD Risk Table to determine the patient's CHD Risk.        ATP III CLASSIFICATION (LDL):  <100     mg/dL   Optimal  100-129  mg/dL   Near or Above                    Optimal  130-159  mg/dL   Borderline  160-189  mg/dL   High  >190     mg/dL   Very High   LDLDIRECT 123.2 09/11/2014 0850    Physical Exam:    VS:  BP 140/60   Pulse 80   Ht 4\' 11"  (1.499 m)   Wt 122 lb 12.8 oz (55.7 kg)   SpO2 97%   BMI 24.80 kg/m     Wt Readings from Last 3 Encounters:   07/31/19 122 lb 12.8 oz (55.7 kg)  01/08/19 132 lb 12.8 oz (60.2 kg)  09/03/18 139 lb 3.2 oz (63.1 kg)     Physical Exam  Constitutional: She is oriented to person, place, and time. She appears well-developed and well-nourished. No distress.  Frail, elderly female walking with a walker.  Pt has right arm AV fistula with faint bruit and thrill.   HENT:  Head: Normocephalic and atraumatic.  Neck: Normal range of motion. Neck supple. No JVD present.  Cardiovascular: Normal rate, regular rhythm and intact distal pulses. Exam reveals no gallop and no friction rub.  Murmur heard through chest and carotids, likely referred from her AV fistula.   Pulmonary/Chest: Effort normal and breath sounds normal. No respiratory distress. She has no wheezes. She has no rales.  Abdominal: Soft.  Musculoskeletal:        General: No edema.  Neurological: She is alert and oriented to person, place, and time.  Skin: Skin is warm and dry.  Psychiatric: She has a normal mood and affect. Her behavior is normal. Judgment and thought content normal.  Vitals reviewed.    ASSESSMENT:    1. Essential (primary) hypertension   2. Chronic diastolic CHF (congestive heart failure) ([place])   3. Chronic kidney disease, stage V ([place])   4. Bilateral carotid artery stenosis    PLAN:    In order of problems listed above:  Hypertension -On amlodipine 10 mg, Lasix 80 mg daily, hydralazine 100 mg 3 times daily -BP fairly well controlled.  Continue current therapy.  Chronic LBBB -Stable  Diastolic dysfunction -Patient has been stable for many years.  Focus on blood pressure control. -Pt appears euvolemic with no signs symptoms of volume overload.  CKD stage V -Followed by nephrology, [facility].  Now with AV fistula, not yet on dialysis. -Recent labs with serum creatinine 6.40, potassium 5.4 -Feeling better since starting procrit with improved hemoglobin level.   Carotid artery disease -Carotid ultrasound  done in 01/2019 showing 1-39% bilateral ICA stenosis -Patient is on Plavix.    ? Remote history of CAD -No anginal symptoms  Medication Adjustments/Labs and Tests Ordered: Current medicines are reviewed at length with the patient today.  Concerns regarding medicines are outlined above. Labs and tests ordered and medication changes are outlined in the patient instructions below:  Patient Instructions  Medication Instructions:  Your physician recommends that you continue on your current medications as directed. Please refer to the Current Medication list given to you today.  If you need a  refill on your cardiac medications before your next appointment, please call your pharmacy.   Lab work: None   If you have labs (blood work) drawn today and your tests are completely normal, you will receive your results only by: Marland Kitchen MyChart Message (if you have MyChart) OR . A paper copy in the mail If you have any lab test that is abnormal or we need to change your treatment, we will call you to review the results.  Testing/Procedures: None   Follow-Up: At [facility], you and your health needs are our priority.  As part of our continuing mission to provide you with exceptional heart care, we have created designated Provider Care Teams.  These Care Teams include your primary Cardiologist (physician) and Advanced Practice Providers (APPs -  Physician Assistants and Nurse Practitioners) who all work together to provide you with the care you need, when you need it. You will need a follow up appointment in:  6 months.  Please call our office 2 months in advance to schedule this appointment.  You may see Mertie Moores, MD or one of the following Advanced Practice Providers on your designated Care Team: Richardson Dopp, PA-C [place], [place] . Daune Perch, NP  Any Other Special Instructions Will Be Listed Below (If Applicable).       Signed, Daune Perch, NP  07/31/2019 4:28 PM    [facility]

## 2019-07-31 NOTE — Patient Instructions (Signed)

## 2019-08-06 ENCOUNTER — Other Ambulatory Visit: Payer: Self-pay

## 2019-08-06 ENCOUNTER — Ambulatory Visit (HOSPITAL_COMMUNITY)
Admission: RE | Admit: 2019-08-06 | Discharge: 2019-08-06 | Disposition: A | Discharge status: Home / Self Care | Payer: Medicare Other | Source: Ambulatory Visit | Attending: Internal Medicine | Admitting: Internal Medicine

## 2019-08-06 VITALS — BP 146/60 | HR 74 | Temp 97.2°F | Resp 20

## 2019-08-06 DIAGNOSIS — N185 Chronic kidney disease, stage 5: Secondary | ICD-10-CM | POA: Insufficient documentation

## 2019-08-06 LAB — IRON AND TIBC
Iron: 88 ug/dL (ref 28–170)
Saturation Ratios: 37 % — ABNORMAL HIGH (ref 10.4–31.8)
TIBC: 235 ug/dL — ABNORMAL LOW (ref 250–450)
UIBC: 147 ug/dL

## 2019-08-06 LAB — POCT HEMOGLOBIN-HEMACUE: Hemoglobin: 11.2 g/dL — ABNORMAL LOW (ref 12.0–15.0)

## 2019-08-06 LAB — FERRITIN: Ferritin: 107 ng/mL (ref 11–307)

## 2019-08-06 MED ORDER — EPOETIN ALFA-EPBX 10000 UNIT/ML IJ SOLN
10000.0000 [IU] | INTRAMUSCULAR | Status: DC
  Administered 2019-08-06: 15:00:00 10000 [IU] via SUBCUTANEOUS
  Filled 2019-08-06: qty 1

## 2019-08-13 ENCOUNTER — Encounter (HOSPITAL_COMMUNITY): Payer: Medicare Other

## 2019-08-13 DIAGNOSIS — H52203 Unspecified astigmatism, bilateral: Secondary | ICD-10-CM | POA: Diagnosis not present

## 2019-08-13 DIAGNOSIS — H524 Presbyopia: Secondary | ICD-10-CM | POA: Diagnosis not present

## 2019-08-13 DIAGNOSIS — H04123 Dry eye syndrome of bilateral lacrimal glands: Secondary | ICD-10-CM | POA: Diagnosis not present

## 2019-08-13 DIAGNOSIS — H5203 Hypermetropia, bilateral: Secondary | ICD-10-CM | POA: Diagnosis not present

## 2019-08-13 DIAGNOSIS — Z961 Presence of intraocular lens: Secondary | ICD-10-CM | POA: Diagnosis not present

## 2019-08-13 DIAGNOSIS — H43813 Vitreous degeneration, bilateral: Secondary | ICD-10-CM | POA: Diagnosis not present

## 2019-08-19 DIAGNOSIS — H02833 Dermatochalasis of right eye, unspecified eyelid: Secondary | ICD-10-CM | POA: Diagnosis not present

## 2019-08-19 DIAGNOSIS — H02836 Dermatochalasis of left eye, unspecified eyelid: Secondary | ICD-10-CM | POA: Diagnosis not present

## 2019-08-25 DIAGNOSIS — E872 Acidosis: Secondary | ICD-10-CM | POA: Diagnosis not present

## 2019-08-25 DIAGNOSIS — I770 Arteriovenous fistula, acquired: Secondary | ICD-10-CM | POA: Diagnosis not present

## 2019-08-25 DIAGNOSIS — N185 Chronic kidney disease, stage 5: Secondary | ICD-10-CM | POA: Diagnosis not present

## 2019-08-25 DIAGNOSIS — I701 Atherosclerosis of renal artery: Secondary | ICD-10-CM | POA: Diagnosis not present

## 2019-08-25 DIAGNOSIS — N2581 Secondary hyperparathyroidism of renal origin: Secondary | ICD-10-CM | POA: Diagnosis not present

## 2019-08-25 DIAGNOSIS — I120 Hypertensive chronic kidney disease with stage 5 chronic kidney disease or end stage renal disease: Secondary | ICD-10-CM | POA: Diagnosis not present

## 2019-08-25 DIAGNOSIS — D631 Anemia in chronic kidney disease: Secondary | ICD-10-CM | POA: Diagnosis not present

## 2019-08-25 DIAGNOSIS — N261 Atrophy of kidney (terminal): Secondary | ICD-10-CM | POA: Diagnosis not present

## 2019-08-26 DIAGNOSIS — Z9181 History of falling: Secondary | ICD-10-CM | POA: Diagnosis not present

## 2019-08-26 DIAGNOSIS — Z7902 Long term (current) use of antithrombotics/antiplatelets: Secondary | ICD-10-CM | POA: Diagnosis not present

## 2019-08-26 DIAGNOSIS — I120 Hypertensive chronic kidney disease with stage 5 chronic kidney disease or end stage renal disease: Secondary | ICD-10-CM | POA: Diagnosis not present

## 2019-08-26 DIAGNOSIS — Z992 Dependence on renal dialysis: Secondary | ICD-10-CM | POA: Diagnosis not present

## 2019-08-26 DIAGNOSIS — N186 End stage renal disease: Secondary | ICD-10-CM | POA: Diagnosis not present

## 2019-08-29 DIAGNOSIS — Z9181 History of falling: Secondary | ICD-10-CM | POA: Diagnosis not present

## 2019-08-29 DIAGNOSIS — N186 End stage renal disease: Secondary | ICD-10-CM | POA: Diagnosis not present

## 2019-08-29 DIAGNOSIS — Z992 Dependence on renal dialysis: Secondary | ICD-10-CM | POA: Diagnosis not present

## 2019-08-29 DIAGNOSIS — I120 Hypertensive chronic kidney disease with stage 5 chronic kidney disease or end stage renal disease: Secondary | ICD-10-CM | POA: Diagnosis not present

## 2019-08-29 DIAGNOSIS — Z7902 Long term (current) use of antithrombotics/antiplatelets: Secondary | ICD-10-CM | POA: Diagnosis not present

## 2019-09-01 DIAGNOSIS — Z23 Encounter for immunization: Secondary | ICD-10-CM | POA: Diagnosis not present

## 2019-09-03 ENCOUNTER — Other Ambulatory Visit: Payer: Self-pay

## 2019-09-03 ENCOUNTER — Ambulatory Visit (HOSPITAL_COMMUNITY)
Admission: RE | Admit: 2019-09-03 | Discharge: 2019-09-03 | Disposition: A | Discharge status: Home / Self Care | Payer: Medicare Other | Source: Ambulatory Visit | Attending: Internal Medicine | Admitting: Internal Medicine

## 2019-09-03 VITALS — BP 161/58 | HR 73 | Temp 97.3°F | Resp 20

## 2019-09-03 DIAGNOSIS — N185 Chronic kidney disease, stage 5: Secondary | ICD-10-CM | POA: Diagnosis not present

## 2019-09-03 LAB — IRON AND TIBC
Iron: 41 ug/dL (ref 28–170)
Saturation Ratios: 19 % (ref 10.4–31.8)
TIBC: 220 ug/dL — ABNORMAL LOW (ref 250–450)
UIBC: 179 ug/dL

## 2019-09-03 LAB — POCT HEMOGLOBIN-HEMACUE: Hemoglobin: 10.5 g/dL — ABNORMAL LOW (ref 12.0–15.0)

## 2019-09-03 LAB — FERRITIN: Ferritin: 150 ng/mL (ref 11–307)

## 2019-09-03 MED ORDER — EPOETIN ALFA-EPBX 10000 UNIT/ML IJ SOLN
10000.0000 [IU] | INTRAMUSCULAR | Status: DC
  Administered 2019-09-03: 10000 [IU] via SUBCUTANEOUS
  Filled 2019-09-03: qty 1

## 2019-09-04 DIAGNOSIS — N186 End stage renal disease: Secondary | ICD-10-CM | POA: Diagnosis not present

## 2019-09-04 DIAGNOSIS — Z992 Dependence on renal dialysis: Secondary | ICD-10-CM | POA: Diagnosis not present

## 2019-09-04 DIAGNOSIS — Z9181 History of falling: Secondary | ICD-10-CM | POA: Diagnosis not present

## 2019-09-04 DIAGNOSIS — Z7902 Long term (current) use of antithrombotics/antiplatelets: Secondary | ICD-10-CM | POA: Diagnosis not present

## 2019-09-04 DIAGNOSIS — I120 Hypertensive chronic kidney disease with stage 5 chronic kidney disease or end stage renal disease: Secondary | ICD-10-CM | POA: Diagnosis not present

## 2019-09-06 ENCOUNTER — Other Ambulatory Visit: Payer: Self-pay | Admitting: Cardiovascular Disease

## 2019-09-10 DIAGNOSIS — I120 Hypertensive chronic kidney disease with stage 5 chronic kidney disease or end stage renal disease: Secondary | ICD-10-CM | POA: Diagnosis not present

## 2019-09-10 DIAGNOSIS — N186 End stage renal disease: Secondary | ICD-10-CM | POA: Diagnosis not present

## 2019-09-10 DIAGNOSIS — Z9181 History of falling: Secondary | ICD-10-CM | POA: Diagnosis not present

## 2019-09-10 DIAGNOSIS — Z7902 Long term (current) use of antithrombotics/antiplatelets: Secondary | ICD-10-CM | POA: Diagnosis not present

## 2019-09-10 DIAGNOSIS — Z992 Dependence on renal dialysis: Secondary | ICD-10-CM | POA: Diagnosis not present

## 2019-09-16 DIAGNOSIS — Z992 Dependence on renal dialysis: Secondary | ICD-10-CM | POA: Diagnosis not present

## 2019-09-16 DIAGNOSIS — Z9181 History of falling: Secondary | ICD-10-CM | POA: Diagnosis not present

## 2019-09-16 DIAGNOSIS — N186 End stage renal disease: Secondary | ICD-10-CM | POA: Diagnosis not present

## 2019-09-16 DIAGNOSIS — I120 Hypertensive chronic kidney disease with stage 5 chronic kidney disease or end stage renal disease: Secondary | ICD-10-CM | POA: Diagnosis not present

## 2019-09-16 DIAGNOSIS — Z7902 Long term (current) use of antithrombotics/antiplatelets: Secondary | ICD-10-CM | POA: Diagnosis not present

## 2019-09-22 ENCOUNTER — Other Ambulatory Visit: Payer: Self-pay | Admitting: Cardiovascular Disease

## 2019-09-25 DIAGNOSIS — Z992 Dependence on renal dialysis: Secondary | ICD-10-CM | POA: Diagnosis not present

## 2019-09-25 DIAGNOSIS — N186 End stage renal disease: Secondary | ICD-10-CM | POA: Diagnosis not present

## 2019-09-25 DIAGNOSIS — Z7902 Long term (current) use of antithrombotics/antiplatelets: Secondary | ICD-10-CM | POA: Diagnosis not present

## 2019-09-25 DIAGNOSIS — I120 Hypertensive chronic kidney disease with stage 5 chronic kidney disease or end stage renal disease: Secondary | ICD-10-CM | POA: Diagnosis not present

## 2019-09-25 DIAGNOSIS — Z9181 History of falling: Secondary | ICD-10-CM | POA: Diagnosis not present

## 2019-09-30 ENCOUNTER — Other Ambulatory Visit (HOSPITAL_COMMUNITY): Payer: Self-pay | Admitting: *Deleted

## 2019-10-01 ENCOUNTER — Ambulatory Visit (HOSPITAL_COMMUNITY)
Admission: RE | Admit: 2019-10-01 | Discharge: 2019-10-01 | Disposition: A | Discharge status: Home / Self Care | Payer: Medicare Other | Source: Ambulatory Visit | Attending: Internal Medicine | Admitting: Internal Medicine

## 2019-10-01 ENCOUNTER — Other Ambulatory Visit: Payer: Self-pay

## 2019-10-01 VITALS — BP 134/56 | HR 71 | Temp 97.2°F | Resp 20

## 2019-10-01 DIAGNOSIS — N185 Chronic kidney disease, stage 5: Secondary | ICD-10-CM | POA: Insufficient documentation

## 2019-10-01 LAB — POCT HEMOGLOBIN-HEMACUE: Hemoglobin: 9.8 g/dL — ABNORMAL LOW (ref 12.0–15.0)

## 2019-10-01 MED ORDER — EPOETIN ALFA-EPBX 10000 UNIT/ML IJ SOLN
10000.0000 [IU] | INTRAMUSCULAR | Status: DC
  Administered 2019-10-01: 15:00:00 10000 [IU] via SUBCUTANEOUS
  Filled 2019-10-01: qty 1

## 2019-10-02 DIAGNOSIS — Z7902 Long term (current) use of antithrombotics/antiplatelets: Secondary | ICD-10-CM | POA: Diagnosis not present

## 2019-10-02 DIAGNOSIS — I120 Hypertensive chronic kidney disease with stage 5 chronic kidney disease or end stage renal disease: Secondary | ICD-10-CM | POA: Diagnosis not present

## 2019-10-02 DIAGNOSIS — Z992 Dependence on renal dialysis: Secondary | ICD-10-CM | POA: Diagnosis not present

## 2019-10-02 DIAGNOSIS — Z9181 History of falling: Secondary | ICD-10-CM | POA: Diagnosis not present

## 2019-10-02 DIAGNOSIS — N186 End stage renal disease: Secondary | ICD-10-CM | POA: Diagnosis not present

## 2019-10-03 ENCOUNTER — Ambulatory Visit (HOSPITAL_COMMUNITY)
Admission: RE | Admit: 2019-10-03 | Discharge: 2019-10-03 | Disposition: A | Discharge status: Home / Self Care | Payer: Medicare Other | Source: Ambulatory Visit | Attending: Internal Medicine | Admitting: Internal Medicine

## 2019-10-03 ENCOUNTER — Other Ambulatory Visit: Payer: Self-pay

## 2019-10-03 DIAGNOSIS — D631 Anemia in chronic kidney disease: Secondary | ICD-10-CM | POA: Diagnosis not present

## 2019-10-03 MED ORDER — SODIUM CHLORIDE 0.9 % IV SOLN
510.0000 mg | Freq: Once | INTRAVENOUS | Status: AC
  Administered 2019-10-03: 510 mg via INTRAVENOUS
  Filled 2019-10-03: qty 17

## 2019-10-08 ENCOUNTER — Encounter (HOSPITAL_COMMUNITY): Payer: Medicare Other

## 2019-10-21 DIAGNOSIS — Z7902 Long term (current) use of antithrombotics/antiplatelets: Secondary | ICD-10-CM | POA: Diagnosis not present

## 2019-10-21 DIAGNOSIS — I120 Hypertensive chronic kidney disease with stage 5 chronic kidney disease or end stage renal disease: Secondary | ICD-10-CM | POA: Diagnosis not present

## 2019-10-21 DIAGNOSIS — Z992 Dependence on renal dialysis: Secondary | ICD-10-CM | POA: Diagnosis not present

## 2019-10-21 DIAGNOSIS — Z9181 History of falling: Secondary | ICD-10-CM | POA: Diagnosis not present

## 2019-10-21 DIAGNOSIS — N186 End stage renal disease: Secondary | ICD-10-CM | POA: Diagnosis not present

## 2019-10-29 ENCOUNTER — Encounter (HOSPITAL_COMMUNITY)
Admission: RE | Admit: 2019-10-29 | Discharge: 2019-10-29 | Disposition: A | Discharge status: Home / Self Care | Payer: Medicare Other | Source: Ambulatory Visit | Attending: Internal Medicine | Admitting: Internal Medicine

## 2019-10-29 ENCOUNTER — Other Ambulatory Visit: Payer: Self-pay

## 2019-10-29 VITALS — BP 157/59 | HR 69 | Resp 18

## 2019-10-29 DIAGNOSIS — N185 Chronic kidney disease, stage 5: Secondary | ICD-10-CM | POA: Insufficient documentation

## 2019-10-29 LAB — POCT HEMOGLOBIN-HEMACUE: Hemoglobin: 9.2 g/dL — ABNORMAL LOW (ref 12.0–15.0)

## 2019-10-29 LAB — IRON AND TIBC
Iron: 106 ug/dL (ref 28–170)
Saturation Ratios: 48 % — ABNORMAL HIGH (ref 10.4–31.8)
TIBC: 223 ug/dL — ABNORMAL LOW (ref 250–450)
UIBC: 117 ug/dL

## 2019-10-29 LAB — FERRITIN: Ferritin: 399 ng/mL — ABNORMAL HIGH (ref 11–307)

## 2019-10-29 MED ORDER — EPOETIN ALFA-EPBX 10000 UNIT/ML IJ SOLN
20000.0000 [IU] | INTRAMUSCULAR | Status: DC
  Administered 2019-10-29: 20000 [IU] via SUBCUTANEOUS
  Filled 2019-10-29: qty 2

## 2019-11-25 DIAGNOSIS — N261 Atrophy of kidney (terminal): Secondary | ICD-10-CM | POA: Diagnosis not present

## 2019-11-25 DIAGNOSIS — E872 Acidosis: Secondary | ICD-10-CM | POA: Diagnosis not present

## 2019-11-25 DIAGNOSIS — I770 Arteriovenous fistula, acquired: Secondary | ICD-10-CM | POA: Diagnosis not present

## 2019-11-25 DIAGNOSIS — D631 Anemia in chronic kidney disease: Secondary | ICD-10-CM | POA: Diagnosis not present

## 2019-11-25 DIAGNOSIS — I120 Hypertensive chronic kidney disease with stage 5 chronic kidney disease or end stage renal disease: Secondary | ICD-10-CM | POA: Diagnosis not present

## 2019-11-25 DIAGNOSIS — N185 Chronic kidney disease, stage 5: Secondary | ICD-10-CM | POA: Diagnosis not present

## 2019-11-25 DIAGNOSIS — N2581 Secondary hyperparathyroidism of renal origin: Secondary | ICD-10-CM | POA: Diagnosis not present

## 2019-11-25 DIAGNOSIS — M549 Dorsalgia, unspecified: Secondary | ICD-10-CM | POA: Diagnosis not present

## 2019-11-26 ENCOUNTER — Other Ambulatory Visit: Payer: Self-pay

## 2019-11-26 ENCOUNTER — Ambulatory Visit (HOSPITAL_COMMUNITY)
Admission: RE | Admit: 2019-11-26 | Discharge: 2019-11-26 | Disposition: A | Discharge status: Home / Self Care | Payer: Medicare Other | Source: Ambulatory Visit | Attending: Internal Medicine | Admitting: Internal Medicine

## 2019-11-26 VITALS — BP 141/60 | HR 71 | Temp 97.9°F | Resp 20

## 2019-11-26 DIAGNOSIS — N185 Chronic kidney disease, stage 5: Secondary | ICD-10-CM | POA: Insufficient documentation

## 2019-11-26 LAB — FERRITIN: Ferritin: 319 ng/mL — ABNORMAL HIGH (ref 11–307)

## 2019-11-26 LAB — IRON AND TIBC
Iron: 136 ug/dL (ref 28–170)
Saturation Ratios: 63 % — ABNORMAL HIGH (ref 10.4–31.8)
TIBC: 216 ug/dL — ABNORMAL LOW (ref 250–450)
UIBC: 80 ug/dL

## 2019-11-26 LAB — POCT HEMOGLOBIN-HEMACUE: Hemoglobin: 10 g/dL — ABNORMAL LOW (ref 12.0–15.0)

## 2019-11-26 MED ORDER — EPOETIN ALFA-EPBX 40000 UNIT/ML IJ SOLN
30000.0000 [IU] | INTRAMUSCULAR | Status: DC
  Administered 2019-11-26: 30000 [IU] via SUBCUTANEOUS

## 2019-11-26 MED ORDER — EPOETIN ALFA-EPBX 10000 UNIT/ML IJ SOLN
INTRAMUSCULAR | Status: AC
  Filled 2019-11-26: qty 3

## 2019-12-24 ENCOUNTER — Encounter (HOSPITAL_COMMUNITY)
Admission: RE | Admit: 2019-12-24 | Discharge: 2019-12-24 | Disposition: A | Discharge status: Home / Self Care | Payer: Medicare Other | Source: Ambulatory Visit | Attending: Internal Medicine | Admitting: Internal Medicine

## 2019-12-24 ENCOUNTER — Other Ambulatory Visit: Payer: Self-pay

## 2019-12-24 VITALS — BP 162/60 | HR 67 | Temp 97.8°F

## 2019-12-24 DIAGNOSIS — N185 Chronic kidney disease, stage 5: Secondary | ICD-10-CM | POA: Diagnosis not present

## 2019-12-24 LAB — IRON AND TIBC
Iron: 119 ug/dL (ref 28–170)
Saturation Ratios: 53 % — ABNORMAL HIGH (ref 10.4–31.8)
TIBC: 224 ug/dL — ABNORMAL LOW (ref 250–450)
UIBC: 105 ug/dL

## 2019-12-24 LAB — POCT HEMOGLOBIN-HEMACUE: Hemoglobin: 11 g/dL — ABNORMAL LOW (ref 12.0–15.0)

## 2019-12-24 LAB — FERRITIN: Ferritin: 296 ng/mL (ref 11–307)

## 2019-12-24 MED ORDER — EPOETIN ALFA-EPBX 40000 UNIT/ML IJ SOLN: 30000.0000 [IU] | INTRAMUSCULAR | Status: DC

## 2019-12-24 MED ORDER — EPOETIN ALFA-EPBX 10000 UNIT/ML IJ SOLN
INTRAMUSCULAR | Status: AC
  Administered 2019-12-24: 30000 [IU] via SUBCUTANEOUS
  Filled 2019-12-24: qty 3

## 2020-01-21 ENCOUNTER — Other Ambulatory Visit: Payer: Self-pay

## 2020-01-21 ENCOUNTER — Ambulatory Visit (HOSPITAL_COMMUNITY)
Admission: RE | Admit: 2020-01-21 | Discharge: 2020-01-21 | Disposition: A | Discharge status: Home / Self Care | Payer: Medicare Other | Source: Ambulatory Visit | Attending: Internal Medicine | Admitting: Internal Medicine

## 2020-01-21 DIAGNOSIS — N185 Chronic kidney disease, stage 5: Secondary | ICD-10-CM

## 2020-01-21 LAB — POCT HEMOGLOBIN-HEMACUE: Hemoglobin: 9.9 g/dL — ABNORMAL LOW (ref 12.0–15.0)

## 2020-01-21 LAB — IRON AND TIBC
Iron: 127 ug/dL (ref 28–170)
Saturation Ratios: 58 % — ABNORMAL HIGH (ref 10.4–31.8)
TIBC: 218 ug/dL — ABNORMAL LOW (ref 250–450)
UIBC: 91 ug/dL

## 2020-01-21 LAB — FERRITIN: Ferritin: 271 ng/mL (ref 11–307)

## 2020-01-21 MED ORDER — EPOETIN ALFA-EPBX 3000 UNIT/ML IJ SOLN
3000.0000 [IU] | Freq: Once | INTRAMUSCULAR | Status: DC
  Filled 2020-01-21: qty 1

## 2020-01-21 MED ORDER — EPOETIN ALFA-EPBX 40000 UNIT/ML IJ SOLN
25000.0000 [IU] | INTRAMUSCULAR | Status: DC
  Administered 2020-01-21: 25000 [IU] via SUBCUTANEOUS

## 2020-01-21 MED ORDER — EPOETIN ALFA-EPBX 10000 UNIT/ML IJ SOLN
20000.0000 [IU] | Freq: Once | INTRAMUSCULAR | Status: DC
  Filled 2020-01-21: qty 2

## 2020-01-21 MED ORDER — EPOETIN ALFA-EPBX 2000 UNIT/ML IJ SOLN
2000.0000 [IU] | Freq: Once | INTRAMUSCULAR | Status: DC
  Filled 2020-01-21: qty 1

## 2020-02-05 ENCOUNTER — Other Ambulatory Visit: Payer: Self-pay | Admitting: Family Medicine

## 2020-02-05 DIAGNOSIS — Z1231 Encounter for screening mammogram for malignant neoplasm of breast: Secondary | ICD-10-CM

## 2020-02-11 DIAGNOSIS — E872 Acidosis: Secondary | ICD-10-CM | POA: Diagnosis not present

## 2020-02-11 DIAGNOSIS — N2581 Secondary hyperparathyroidism of renal origin: Secondary | ICD-10-CM | POA: Diagnosis not present

## 2020-02-11 DIAGNOSIS — D631 Anemia in chronic kidney disease: Secondary | ICD-10-CM | POA: Diagnosis not present

## 2020-02-11 DIAGNOSIS — N185 Chronic kidney disease, stage 5: Secondary | ICD-10-CM | POA: Diagnosis not present

## 2020-02-11 DIAGNOSIS — I120 Hypertensive chronic kidney disease with stage 5 chronic kidney disease or end stage renal disease: Secondary | ICD-10-CM | POA: Diagnosis not present

## 2020-02-11 DIAGNOSIS — I770 Arteriovenous fistula, acquired: Secondary | ICD-10-CM | POA: Diagnosis not present

## 2020-02-11 DIAGNOSIS — M25519 Pain in unspecified shoulder: Secondary | ICD-10-CM | POA: Diagnosis not present

## 2020-02-11 DIAGNOSIS — N261 Atrophy of kidney (terminal): Secondary | ICD-10-CM | POA: Diagnosis not present

## 2020-02-18 ENCOUNTER — Other Ambulatory Visit: Payer: Self-pay

## 2020-02-18 ENCOUNTER — Ambulatory Visit (HOSPITAL_COMMUNITY)
Admission: RE | Admit: 2020-02-18 | Discharge: 2020-02-18 | Disposition: A | Discharge status: Home / Self Care | Payer: Medicare Other | Source: Ambulatory Visit | Attending: Internal Medicine | Admitting: Internal Medicine

## 2020-02-18 VITALS — BP 151/49 | HR 69 | Resp 20

## 2020-02-18 DIAGNOSIS — N185 Chronic kidney disease, stage 5: Secondary | ICD-10-CM | POA: Insufficient documentation

## 2020-02-18 LAB — POCT HEMOGLOBIN-HEMACUE: Hemoglobin: 9.8 g/dL — ABNORMAL LOW (ref 12.0–15.0)

## 2020-02-18 LAB — IRON AND TIBC
Iron: 115 ug/dL (ref 28–170)
Saturation Ratios: 54 % — ABNORMAL HIGH (ref 10.4–31.8)
TIBC: 211 ug/dL — ABNORMAL LOW (ref 250–450)
UIBC: 96 ug/dL

## 2020-02-18 LAB — RENAL FUNCTION PANEL
Albumin: 3.5 g/dL (ref 3.5–5.0)
Anion gap: 12 (ref 5–15)
BUN: 64 mg/dL — ABNORMAL HIGH (ref 8–23)
CO2: 16 mmol/L — ABNORMAL LOW (ref 22–32)
Calcium: 9.7 mg/dL (ref 8.9–10.3)
Chloride: 108 mmol/L (ref 98–111)
Creatinine, Ser: 6.47 mg/dL — ABNORMAL HIGH (ref 0.44–1.00)
GFR calc Af Amer: 6 mL/min — ABNORMAL LOW (ref >60)
GFR calc non Af Amer: 5 mL/min — ABNORMAL LOW (ref >60)
Glucose, Bld: 100 mg/dL — ABNORMAL HIGH (ref 70–99)
Phosphorus: 5.7 mg/dL — ABNORMAL HIGH (ref 2.5–4.6)
Potassium: 5.3 mmol/L — ABNORMAL HIGH (ref 3.5–5.1)
Sodium: 136 mmol/L (ref 135–145)

## 2020-02-18 LAB — FERRITIN: Ferritin: 256 ng/mL (ref 11–307)

## 2020-02-18 MED ORDER — EPOETIN ALFA-EPBX 10000 UNIT/ML IJ SOLN
INTRAMUSCULAR | Status: AC
  Administered 2020-02-18: 20000 [IU] via SUBCUTANEOUS
  Filled 2020-02-18: qty 2

## 2020-02-18 MED ORDER — EPOETIN ALFA-EPBX 10000 UNIT/ML IJ SOLN: 25000.0000 [IU] | INTRAMUSCULAR | Status: DC

## 2020-02-18 MED ORDER — EPOETIN ALFA-EPBX 2000 UNIT/ML IJ SOLN
INTRAMUSCULAR | Status: AC
  Administered 2020-02-18: 2000 [IU] via SUBCUTANEOUS
  Filled 2020-02-18: qty 1

## 2020-02-18 MED ORDER — EPOETIN ALFA-EPBX 3000 UNIT/ML IJ SOLN
INTRAMUSCULAR | Status: AC
  Administered 2020-02-18: 3000 [IU] via SUBCUTANEOUS
  Filled 2020-02-18: qty 1

## 2020-02-19 LAB — PTH, INTACT AND CALCIUM
Calcium, Total (PTH): 9.7 mg/dL (ref 8.7–10.3)
PTH: 419 pg/mL — ABNORMAL HIGH (ref 15–65)

## 2020-02-27 ENCOUNTER — Ambulatory Visit: Payer: Medicare Other

## 2020-03-04 DIAGNOSIS — M130 Polyarthritis, unspecified: Secondary | ICD-10-CM | POA: Diagnosis not present

## 2020-03-04 DIAGNOSIS — G40909 Epilepsy, unspecified, not intractable, without status epilepticus: Secondary | ICD-10-CM | POA: Diagnosis not present

## 2020-03-04 DIAGNOSIS — N186 End stage renal disease: Secondary | ICD-10-CM | POA: Diagnosis not present

## 2020-03-04 DIAGNOSIS — Z6825 Body mass index (BMI) 25.0-25.9, adult: Secondary | ICD-10-CM | POA: Diagnosis not present

## 2020-03-04 DIAGNOSIS — R634 Abnormal weight loss: Secondary | ICD-10-CM | POA: Diagnosis not present

## 2020-03-04 DIAGNOSIS — F4325 Adjustment disorder with mixed disturbance of emotions and conduct: Secondary | ICD-10-CM | POA: Diagnosis not present

## 2020-03-17 ENCOUNTER — Ambulatory Visit (HOSPITAL_COMMUNITY)
Admission: RE | Admit: 2020-03-17 | Discharge: 2020-03-17 | Disposition: A | Discharge status: Home / Self Care | Payer: Medicare Other | Source: Ambulatory Visit | Attending: Internal Medicine | Admitting: Internal Medicine

## 2020-03-17 ENCOUNTER — Other Ambulatory Visit: Payer: Self-pay

## 2020-03-17 VITALS — BP 166/61 | HR 73 | Resp 20

## 2020-03-17 DIAGNOSIS — N185 Chronic kidney disease, stage 5: Secondary | ICD-10-CM | POA: Insufficient documentation

## 2020-03-17 LAB — POCT HEMOGLOBIN-HEMACUE: Hemoglobin: 9.5 g/dL — ABNORMAL LOW (ref 12.0–15.0)

## 2020-03-17 LAB — IRON AND TIBC
Iron: 122 ug/dL (ref 28–170)
Saturation Ratios: 61 % — ABNORMAL HIGH (ref 10.4–31.8)
TIBC: 200 ug/dL — ABNORMAL LOW (ref 250–450)
UIBC: 78 ug/dL

## 2020-03-17 LAB — FERRITIN: Ferritin: 313 ng/mL — ABNORMAL HIGH (ref 11–307)

## 2020-03-17 MED ORDER — EPOETIN ALFA-EPBX 2000 UNIT/ML IJ SOLN
INTRAMUSCULAR | Status: AC
  Administered 2020-03-17: 2000 [IU]
  Filled 2020-03-17: qty 1

## 2020-03-17 MED ORDER — EPOETIN ALFA-EPBX 10000 UNIT/ML IJ SOLN: 25000.0000 [IU] | INTRAMUSCULAR | Status: DC

## 2020-03-17 MED ORDER — EPOETIN ALFA-EPBX 3000 UNIT/ML IJ SOLN
INTRAMUSCULAR | Status: AC
  Administered 2020-03-17: 3000 [IU]
  Filled 2020-03-17: qty 1

## 2020-03-17 MED ORDER — EPOETIN ALFA-EPBX 10000 UNIT/ML IJ SOLN
INTRAMUSCULAR | Status: AC
  Administered 2020-03-17: 20000 [IU]
  Filled 2020-03-17: qty 2

## 2020-03-18 ENCOUNTER — Ambulatory Visit
Admission: RE | Admit: 2020-03-18 | Discharge: 2020-03-18 | Disposition: A | Discharge status: Home / Self Care | Payer: Medicare Other | Source: Ambulatory Visit | Attending: Family Medicine | Admitting: Family Medicine

## 2020-03-18 ENCOUNTER — Other Ambulatory Visit: Payer: Self-pay

## 2020-03-18 DIAGNOSIS — Z1231 Encounter for screening mammogram for malignant neoplasm of breast: Secondary | ICD-10-CM | POA: Diagnosis not present

## 2020-04-07 DIAGNOSIS — G40909 Epilepsy, unspecified, not intractable, without status epilepticus: Secondary | ICD-10-CM | POA: Diagnosis not present

## 2020-04-07 DIAGNOSIS — H02836 Dermatochalasis of left eye, unspecified eyelid: Secondary | ICD-10-CM | POA: Diagnosis not present

## 2020-04-07 DIAGNOSIS — H04123 Dry eye syndrome of bilateral lacrimal glands: Secondary | ICD-10-CM | POA: Diagnosis not present

## 2020-04-07 DIAGNOSIS — I10 Essential (primary) hypertension: Secondary | ICD-10-CM | POA: Diagnosis not present

## 2020-04-07 DIAGNOSIS — H1045 Other chronic allergic conjunctivitis: Secondary | ICD-10-CM | POA: Diagnosis not present

## 2020-04-07 DIAGNOSIS — M130 Polyarthritis, unspecified: Secondary | ICD-10-CM | POA: Diagnosis not present

## 2020-04-07 DIAGNOSIS — I120 Hypertensive chronic kidney disease with stage 5 chronic kidney disease or end stage renal disease: Secondary | ICD-10-CM | POA: Diagnosis not present

## 2020-04-07 DIAGNOSIS — Z961 Presence of intraocular lens: Secondary | ICD-10-CM | POA: Diagnosis not present

## 2020-04-07 DIAGNOSIS — H02833 Dermatochalasis of right eye, unspecified eyelid: Secondary | ICD-10-CM | POA: Diagnosis not present

## 2020-04-14 ENCOUNTER — Other Ambulatory Visit: Payer: Self-pay

## 2020-04-14 ENCOUNTER — Ambulatory Visit (HOSPITAL_COMMUNITY)
Admission: RE | Admit: 2020-04-14 | Discharge: 2020-04-14 | Disposition: A | Discharge status: Home / Self Care | Payer: Medicare Other | Source: Ambulatory Visit | Attending: Internal Medicine | Admitting: Internal Medicine

## 2020-04-14 VITALS — BP 152/55 | HR 77 | Temp 96.9°F | Resp 18

## 2020-04-14 DIAGNOSIS — N185 Chronic kidney disease, stage 5: Secondary | ICD-10-CM | POA: Diagnosis not present

## 2020-04-14 LAB — FERRITIN: Ferritin: 263 ng/mL (ref 11–307)

## 2020-04-14 LAB — POCT HEMOGLOBIN-HEMACUE: Hemoglobin: 8.6 g/dL — ABNORMAL LOW (ref 12.0–15.0)

## 2020-04-14 LAB — IRON AND TIBC
Iron: 131 ug/dL (ref 28–170)
Saturation Ratios: 66 % — ABNORMAL HIGH (ref 10.4–31.8)
TIBC: 199 ug/dL — ABNORMAL LOW (ref 250–450)
UIBC: 68 ug/dL

## 2020-04-14 MED ORDER — EPOETIN ALFA-EPBX 10000 UNIT/ML IJ SOLN
INTRAMUSCULAR | Status: AC
  Filled 2020-04-14: qty 3

## 2020-04-14 MED ORDER — EPOETIN ALFA-EPBX 10000 UNIT/ML IJ SOLN
30000.0000 [IU] | INTRAMUSCULAR | Status: DC
  Administered 2020-04-14: 30000 [IU] via SUBCUTANEOUS

## 2020-05-12 ENCOUNTER — Other Ambulatory Visit: Payer: Self-pay

## 2020-05-12 ENCOUNTER — Encounter (HOSPITAL_COMMUNITY)
Admission: RE | Admit: 2020-05-12 | Discharge: 2020-05-12 | Disposition: A | Discharge status: Home / Self Care | Payer: Medicare Other | Source: Ambulatory Visit | Attending: Internal Medicine | Admitting: Internal Medicine

## 2020-05-12 VITALS — BP 157/52 | HR 74 | Temp 97.8°F | Resp 18

## 2020-05-12 DIAGNOSIS — N261 Atrophy of kidney (terminal): Secondary | ICD-10-CM | POA: Diagnosis not present

## 2020-05-12 DIAGNOSIS — N2581 Secondary hyperparathyroidism of renal origin: Secondary | ICD-10-CM | POA: Diagnosis not present

## 2020-05-12 DIAGNOSIS — N185 Chronic kidney disease, stage 5: Secondary | ICD-10-CM | POA: Diagnosis not present

## 2020-05-12 DIAGNOSIS — M25519 Pain in unspecified shoulder: Secondary | ICD-10-CM | POA: Diagnosis not present

## 2020-05-12 DIAGNOSIS — E872 Acidosis: Secondary | ICD-10-CM | POA: Diagnosis not present

## 2020-05-12 DIAGNOSIS — I120 Hypertensive chronic kidney disease with stage 5 chronic kidney disease or end stage renal disease: Secondary | ICD-10-CM | POA: Diagnosis not present

## 2020-05-12 DIAGNOSIS — R609 Edema, unspecified: Secondary | ICD-10-CM | POA: Diagnosis not present

## 2020-05-12 DIAGNOSIS — I770 Arteriovenous fistula, acquired: Secondary | ICD-10-CM | POA: Diagnosis not present

## 2020-05-12 DIAGNOSIS — D631 Anemia in chronic kidney disease: Secondary | ICD-10-CM | POA: Diagnosis not present

## 2020-05-12 LAB — IRON AND TIBC
Iron: 120 ug/dL (ref 28–170)
Saturation Ratios: 55 % — ABNORMAL HIGH (ref 10.4–31.8)
TIBC: 217 ug/dL — ABNORMAL LOW (ref 250–450)
UIBC: 97 ug/dL

## 2020-05-12 LAB — RENAL FUNCTION PANEL
Albumin: 3.3 g/dL — ABNORMAL LOW (ref 3.5–5.0)
Anion gap: 16 — ABNORMAL HIGH (ref 5–15)
BUN: 59 mg/dL — ABNORMAL HIGH (ref 8–23)
CO2: 14 mmol/L — ABNORMAL LOW (ref 22–32)
Calcium: 9.3 mg/dL (ref 8.9–10.3)
Chloride: 107 mmol/L (ref 98–111)
Creatinine, Ser: 6.43 mg/dL — ABNORMAL HIGH (ref 0.44–1.00)
GFR calc Af Amer: 6 mL/min — ABNORMAL LOW (ref >60)
GFR calc non Af Amer: 5 mL/min — ABNORMAL LOW (ref >60)
Glucose, Bld: 94 mg/dL (ref 70–99)
Phosphorus: 6 mg/dL — ABNORMAL HIGH (ref 2.5–4.6)
Potassium: 5.5 mmol/L — ABNORMAL HIGH (ref 3.5–5.1)
Sodium: 137 mmol/L (ref 135–145)

## 2020-05-12 LAB — POCT HEMOGLOBIN-HEMACUE: Hemoglobin: 10 g/dL — ABNORMAL LOW (ref 12.0–15.0)

## 2020-05-12 LAB — FERRITIN: Ferritin: 250 ng/mL (ref 11–307)

## 2020-05-12 MED ORDER — EPOETIN ALFA-EPBX 10000 UNIT/ML IJ SOLN: 30000.0000 [IU] | INTRAMUSCULAR | Status: DC

## 2020-05-12 MED ORDER — EPOETIN ALFA-EPBX 40000 UNIT/ML IJ SOLN
INTRAMUSCULAR | Status: AC
  Administered 2020-05-12: 40000 [IU] via SUBCUTANEOUS
  Filled 2020-05-12: qty 1

## 2020-05-12 MED ORDER — EPOETIN ALFA-EPBX 40000 UNIT/ML IJ SOLN: 40000.0000 [IU] | Freq: Once | INTRAMUSCULAR | Status: AC

## 2020-05-13 LAB — PTH, INTACT AND CALCIUM
Calcium, Total (PTH): 9.3 mg/dL (ref 8.7–10.3)
PTH: 451 pg/mL — ABNORMAL HIGH (ref 15–65)

## 2020-06-09 ENCOUNTER — Other Ambulatory Visit: Payer: Self-pay

## 2020-06-09 ENCOUNTER — Ambulatory Visit (HOSPITAL_COMMUNITY)
Admission: RE | Admit: 2020-06-09 | Discharge: 2020-06-09 | Disposition: A | Discharge status: Home / Self Care | Payer: Medicare Other | Source: Ambulatory Visit | Attending: Internal Medicine | Admitting: Internal Medicine

## 2020-06-09 VITALS — BP 179/52 | HR 67 | Temp 97.6°F

## 2020-06-09 DIAGNOSIS — N185 Chronic kidney disease, stage 5: Secondary | ICD-10-CM | POA: Diagnosis not present

## 2020-06-09 LAB — IRON AND TIBC
Iron: 88 ug/dL (ref 28–170)
Saturation Ratios: 38 % — ABNORMAL HIGH (ref 10.4–31.8)
TIBC: 231 ug/dL — ABNORMAL LOW (ref 250–450)
UIBC: 143 ug/dL

## 2020-06-09 LAB — FERRITIN: Ferritin: 247 ng/mL (ref 11–307)

## 2020-06-09 LAB — POCT HEMOGLOBIN-HEMACUE: Hemoglobin: 10 g/dL — ABNORMAL LOW (ref 12.0–15.0)

## 2020-06-09 MED ORDER — EPOETIN ALFA-EPBX 40000 UNIT/ML IJ SOLN: 40000.0000 [IU] | INTRAMUSCULAR | Status: DC

## 2020-06-09 MED ORDER — EPOETIN ALFA-EPBX 40000 UNIT/ML IJ SOLN
INTRAMUSCULAR | Status: AC
  Administered 2020-06-09: 40000 [IU] via SUBCUTANEOUS
  Filled 2020-06-09: qty 1

## 2020-06-25 DIAGNOSIS — E872 Acidosis: Secondary | ICD-10-CM | POA: Diagnosis not present

## 2020-06-25 DIAGNOSIS — N261 Atrophy of kidney (terminal): Secondary | ICD-10-CM | POA: Diagnosis not present

## 2020-06-25 DIAGNOSIS — D631 Anemia in chronic kidney disease: Secondary | ICD-10-CM | POA: Diagnosis not present

## 2020-06-25 DIAGNOSIS — M25519 Pain in unspecified shoulder: Secondary | ICD-10-CM | POA: Diagnosis not present

## 2020-06-25 DIAGNOSIS — I770 Arteriovenous fistula, acquired: Secondary | ICD-10-CM | POA: Diagnosis not present

## 2020-06-25 DIAGNOSIS — N2581 Secondary hyperparathyroidism of renal origin: Secondary | ICD-10-CM | POA: Diagnosis not present

## 2020-06-25 DIAGNOSIS — N185 Chronic kidney disease, stage 5: Secondary | ICD-10-CM | POA: Diagnosis not present

## 2020-06-25 DIAGNOSIS — I120 Hypertensive chronic kidney disease with stage 5 chronic kidney disease or end stage renal disease: Secondary | ICD-10-CM | POA: Diagnosis not present

## 2020-06-25 DIAGNOSIS — R609 Edema, unspecified: Secondary | ICD-10-CM | POA: Diagnosis not present

## 2020-07-07 ENCOUNTER — Other Ambulatory Visit: Payer: Self-pay

## 2020-07-07 ENCOUNTER — Ambulatory Visit (HOSPITAL_COMMUNITY)
Admission: RE | Admit: 2020-07-07 | Discharge: 2020-07-07 | Disposition: A | Discharge status: Home / Self Care | Payer: Medicare Other | Source: Ambulatory Visit | Attending: Internal Medicine | Admitting: Internal Medicine

## 2020-07-07 VITALS — BP 163/55 | HR 70 | Resp 18

## 2020-07-07 DIAGNOSIS — N185 Chronic kidney disease, stage 5: Secondary | ICD-10-CM | POA: Insufficient documentation

## 2020-07-07 LAB — FERRITIN: Ferritin: 235 ng/mL (ref 11–307)

## 2020-07-07 LAB — IRON AND TIBC
Iron: 110 ug/dL (ref 28–170)
Saturation Ratios: 49 % — ABNORMAL HIGH (ref 10.4–31.8)
TIBC: 227 ug/dL — ABNORMAL LOW (ref 250–450)
UIBC: 117 ug/dL

## 2020-07-07 LAB — POCT HEMOGLOBIN-HEMACUE: Hemoglobin: 9.6 g/dL — ABNORMAL LOW (ref 12.0–15.0)

## 2020-07-07 MED ORDER — EPOETIN ALFA-EPBX 40000 UNIT/ML IJ SOLN
INTRAMUSCULAR | Status: AC
  Administered 2020-07-07: 40000 [IU] via SUBCUTANEOUS
  Filled 2020-07-07: qty 1

## 2020-07-07 MED ORDER — EPOETIN ALFA-EPBX 40000 UNIT/ML IJ SOLN: 40000.0000 [IU] | INTRAMUSCULAR | Status: DC

## 2020-07-08 DIAGNOSIS — I120 Hypertensive chronic kidney disease with stage 5 chronic kidney disease or end stage renal disease: Secondary | ICD-10-CM | POA: Diagnosis not present

## 2020-07-08 DIAGNOSIS — N186 End stage renal disease: Secondary | ICD-10-CM | POA: Diagnosis not present

## 2020-07-08 DIAGNOSIS — I10 Essential (primary) hypertension: Secondary | ICD-10-CM | POA: Diagnosis not present

## 2020-07-08 DIAGNOSIS — N390 Urinary tract infection, site not specified: Secondary | ICD-10-CM | POA: Diagnosis not present

## 2020-07-14 DIAGNOSIS — I120 Hypertensive chronic kidney disease with stage 5 chronic kidney disease or end stage renal disease: Secondary | ICD-10-CM | POA: Diagnosis not present

## 2020-07-14 DIAGNOSIS — I5032 Chronic diastolic (congestive) heart failure: Secondary | ICD-10-CM | POA: Diagnosis not present

## 2020-07-19 ENCOUNTER — Other Ambulatory Visit: Payer: Self-pay

## 2020-07-19 MED ORDER — DOXAZOSIN MESYLATE 8 MG PO TABS: ORAL_TABLET | ORAL | 0 refills | Status: DC

## 2020-07-30 DIAGNOSIS — I509 Heart failure, unspecified: Secondary | ICD-10-CM | POA: Diagnosis not present

## 2020-07-30 DIAGNOSIS — N186 End stage renal disease: Secondary | ICD-10-CM | POA: Diagnosis not present

## 2020-07-30 DIAGNOSIS — I2510 Atherosclerotic heart disease of native coronary artery without angina pectoris: Secondary | ICD-10-CM | POA: Diagnosis not present

## 2020-07-30 DIAGNOSIS — Z6824 Body mass index (BMI) 24.0-24.9, adult: Secondary | ICD-10-CM | POA: Diagnosis not present

## 2020-07-30 DIAGNOSIS — G40909 Epilepsy, unspecified, not intractable, without status epilepticus: Secondary | ICD-10-CM | POA: Diagnosis not present

## 2020-07-30 DIAGNOSIS — I739 Peripheral vascular disease, unspecified: Secondary | ICD-10-CM | POA: Diagnosis not present

## 2020-07-30 DIAGNOSIS — I701 Atherosclerosis of renal artery: Secondary | ICD-10-CM | POA: Diagnosis not present

## 2020-07-30 DIAGNOSIS — I132 Hypertensive heart and chronic kidney disease with heart failure and with stage 5 chronic kidney disease, or end stage renal disease: Secondary | ICD-10-CM | POA: Diagnosis not present

## 2020-07-30 DIAGNOSIS — D631 Anemia in chronic kidney disease: Secondary | ICD-10-CM | POA: Diagnosis not present

## 2020-08-01 DIAGNOSIS — D631 Anemia in chronic kidney disease: Secondary | ICD-10-CM | POA: Diagnosis not present

## 2020-08-01 DIAGNOSIS — N186 End stage renal disease: Secondary | ICD-10-CM | POA: Diagnosis not present

## 2020-08-01 DIAGNOSIS — I509 Heart failure, unspecified: Secondary | ICD-10-CM | POA: Diagnosis not present

## 2020-08-01 DIAGNOSIS — I739 Peripheral vascular disease, unspecified: Secondary | ICD-10-CM | POA: Diagnosis not present

## 2020-08-01 DIAGNOSIS — I2510 Atherosclerotic heart disease of native coronary artery without angina pectoris: Secondary | ICD-10-CM | POA: Diagnosis not present

## 2020-08-01 DIAGNOSIS — I132 Hypertensive heart and chronic kidney disease with heart failure and with stage 5 chronic kidney disease, or end stage renal disease: Secondary | ICD-10-CM | POA: Diagnosis not present

## 2020-08-02 DIAGNOSIS — D631 Anemia in chronic kidney disease: Secondary | ICD-10-CM | POA: Diagnosis not present

## 2020-08-02 DIAGNOSIS — N186 End stage renal disease: Secondary | ICD-10-CM | POA: Diagnosis not present

## 2020-08-02 DIAGNOSIS — I739 Peripheral vascular disease, unspecified: Secondary | ICD-10-CM | POA: Diagnosis not present

## 2020-08-02 DIAGNOSIS — I132 Hypertensive heart and chronic kidney disease with heart failure and with stage 5 chronic kidney disease, or end stage renal disease: Secondary | ICD-10-CM | POA: Diagnosis not present

## 2020-08-02 DIAGNOSIS — I2510 Atherosclerotic heart disease of native coronary artery without angina pectoris: Secondary | ICD-10-CM | POA: Diagnosis not present

## 2020-08-02 DIAGNOSIS — I509 Heart failure, unspecified: Secondary | ICD-10-CM | POA: Diagnosis not present

## 2020-08-04 ENCOUNTER — Encounter (HOSPITAL_COMMUNITY): Payer: Medicare Other

## 2020-08-05 DIAGNOSIS — I2510 Atherosclerotic heart disease of native coronary artery without angina pectoris: Secondary | ICD-10-CM | POA: Diagnosis not present

## 2020-08-05 DIAGNOSIS — I739 Peripheral vascular disease, unspecified: Secondary | ICD-10-CM | POA: Diagnosis not present

## 2020-08-05 DIAGNOSIS — I509 Heart failure, unspecified: Secondary | ICD-10-CM | POA: Diagnosis not present

## 2020-08-05 DIAGNOSIS — D631 Anemia in chronic kidney disease: Secondary | ICD-10-CM | POA: Diagnosis not present

## 2020-08-05 DIAGNOSIS — I132 Hypertensive heart and chronic kidney disease with heart failure and with stage 5 chronic kidney disease, or end stage renal disease: Secondary | ICD-10-CM | POA: Diagnosis not present

## 2020-08-05 DIAGNOSIS — N186 End stage renal disease: Secondary | ICD-10-CM | POA: Diagnosis not present

## 2020-08-09 DIAGNOSIS — N186 End stage renal disease: Secondary | ICD-10-CM | POA: Diagnosis not present

## 2020-08-09 DIAGNOSIS — I132 Hypertensive heart and chronic kidney disease with heart failure and with stage 5 chronic kidney disease, or end stage renal disease: Secondary | ICD-10-CM | POA: Diagnosis not present

## 2020-08-09 DIAGNOSIS — I2510 Atherosclerotic heart disease of native coronary artery without angina pectoris: Secondary | ICD-10-CM | POA: Diagnosis not present

## 2020-08-09 DIAGNOSIS — I509 Heart failure, unspecified: Secondary | ICD-10-CM | POA: Diagnosis not present

## 2020-08-09 DIAGNOSIS — D631 Anemia in chronic kidney disease: Secondary | ICD-10-CM | POA: Diagnosis not present

## 2020-08-09 DIAGNOSIS — I739 Peripheral vascular disease, unspecified: Secondary | ICD-10-CM | POA: Diagnosis not present

## 2020-08-11 DIAGNOSIS — I739 Peripheral vascular disease, unspecified: Secondary | ICD-10-CM | POA: Diagnosis not present

## 2020-08-11 DIAGNOSIS — D631 Anemia in chronic kidney disease: Secondary | ICD-10-CM | POA: Diagnosis not present

## 2020-08-11 DIAGNOSIS — I2510 Atherosclerotic heart disease of native coronary artery without angina pectoris: Secondary | ICD-10-CM | POA: Diagnosis not present

## 2020-08-11 DIAGNOSIS — N186 End stage renal disease: Secondary | ICD-10-CM | POA: Diagnosis not present

## 2020-08-11 DIAGNOSIS — I509 Heart failure, unspecified: Secondary | ICD-10-CM | POA: Diagnosis not present

## 2020-08-11 DIAGNOSIS — I701 Atherosclerosis of renal artery: Secondary | ICD-10-CM | POA: Diagnosis not present

## 2020-08-11 DIAGNOSIS — G40909 Epilepsy, unspecified, not intractable, without status epilepticus: Secondary | ICD-10-CM | POA: Diagnosis not present

## 2020-08-11 DIAGNOSIS — Z6824 Body mass index (BMI) 24.0-24.9, adult: Secondary | ICD-10-CM | POA: Diagnosis not present

## 2020-08-11 DIAGNOSIS — I132 Hypertensive heart and chronic kidney disease with heart failure and with stage 5 chronic kidney disease, or end stage renal disease: Secondary | ICD-10-CM | POA: Diagnosis not present

## 2020-08-13 DIAGNOSIS — I132 Hypertensive heart and chronic kidney disease with heart failure and with stage 5 chronic kidney disease, or end stage renal disease: Secondary | ICD-10-CM | POA: Diagnosis not present

## 2020-08-13 DIAGNOSIS — N186 End stage renal disease: Secondary | ICD-10-CM | POA: Diagnosis not present

## 2020-08-13 DIAGNOSIS — I2510 Atherosclerotic heart disease of native coronary artery without angina pectoris: Secondary | ICD-10-CM | POA: Diagnosis not present

## 2020-08-13 DIAGNOSIS — I509 Heart failure, unspecified: Secondary | ICD-10-CM | POA: Diagnosis not present

## 2020-08-13 DIAGNOSIS — D631 Anemia in chronic kidney disease: Secondary | ICD-10-CM | POA: Diagnosis not present

## 2020-08-13 DIAGNOSIS — I739 Peripheral vascular disease, unspecified: Secondary | ICD-10-CM | POA: Diagnosis not present

## 2020-08-17 ENCOUNTER — Other Ambulatory Visit: Payer: Self-pay | Admitting: Cardiovascular Disease

## 2020-08-18 DIAGNOSIS — I739 Peripheral vascular disease, unspecified: Secondary | ICD-10-CM | POA: Diagnosis not present

## 2020-08-18 DIAGNOSIS — I509 Heart failure, unspecified: Secondary | ICD-10-CM | POA: Diagnosis not present

## 2020-08-18 DIAGNOSIS — I2510 Atherosclerotic heart disease of native coronary artery without angina pectoris: Secondary | ICD-10-CM | POA: Diagnosis not present

## 2020-08-18 DIAGNOSIS — N186 End stage renal disease: Secondary | ICD-10-CM | POA: Diagnosis not present

## 2020-08-18 DIAGNOSIS — D631 Anemia in chronic kidney disease: Secondary | ICD-10-CM | POA: Diagnosis not present

## 2020-08-18 DIAGNOSIS — I132 Hypertensive heart and chronic kidney disease with heart failure and with stage 5 chronic kidney disease, or end stage renal disease: Secondary | ICD-10-CM | POA: Diagnosis not present

## 2020-08-20 DIAGNOSIS — I739 Peripheral vascular disease, unspecified: Secondary | ICD-10-CM | POA: Diagnosis not present

## 2020-08-20 DIAGNOSIS — I132 Hypertensive heart and chronic kidney disease with heart failure and with stage 5 chronic kidney disease, or end stage renal disease: Secondary | ICD-10-CM | POA: Diagnosis not present

## 2020-08-20 DIAGNOSIS — I2510 Atherosclerotic heart disease of native coronary artery without angina pectoris: Secondary | ICD-10-CM | POA: Diagnosis not present

## 2020-08-20 DIAGNOSIS — N186 End stage renal disease: Secondary | ICD-10-CM | POA: Diagnosis not present

## 2020-08-20 DIAGNOSIS — I509 Heart failure, unspecified: Secondary | ICD-10-CM | POA: Diagnosis not present

## 2020-08-20 DIAGNOSIS — D631 Anemia in chronic kidney disease: Secondary | ICD-10-CM | POA: Diagnosis not present

## 2020-08-24 DIAGNOSIS — I2510 Atherosclerotic heart disease of native coronary artery without angina pectoris: Secondary | ICD-10-CM | POA: Diagnosis not present

## 2020-08-24 DIAGNOSIS — N186 End stage renal disease: Secondary | ICD-10-CM | POA: Diagnosis not present

## 2020-08-24 DIAGNOSIS — I509 Heart failure, unspecified: Secondary | ICD-10-CM | POA: Diagnosis not present

## 2020-08-24 DIAGNOSIS — D631 Anemia in chronic kidney disease: Secondary | ICD-10-CM | POA: Diagnosis not present

## 2020-08-24 DIAGNOSIS — I739 Peripheral vascular disease, unspecified: Secondary | ICD-10-CM | POA: Diagnosis not present

## 2020-08-24 DIAGNOSIS — I132 Hypertensive heart and chronic kidney disease with heart failure and with stage 5 chronic kidney disease, or end stage renal disease: Secondary | ICD-10-CM | POA: Diagnosis not present

## 2020-08-25 DIAGNOSIS — D631 Anemia in chronic kidney disease: Secondary | ICD-10-CM | POA: Diagnosis not present

## 2020-08-25 DIAGNOSIS — N186 End stage renal disease: Secondary | ICD-10-CM | POA: Diagnosis not present

## 2020-08-25 DIAGNOSIS — I509 Heart failure, unspecified: Secondary | ICD-10-CM | POA: Diagnosis not present

## 2020-08-25 DIAGNOSIS — I739 Peripheral vascular disease, unspecified: Secondary | ICD-10-CM | POA: Diagnosis not present

## 2020-08-25 DIAGNOSIS — I2510 Atherosclerotic heart disease of native coronary artery without angina pectoris: Secondary | ICD-10-CM | POA: Diagnosis not present

## 2020-08-25 DIAGNOSIS — I132 Hypertensive heart and chronic kidney disease with heart failure and with stage 5 chronic kidney disease, or end stage renal disease: Secondary | ICD-10-CM | POA: Diagnosis not present

## 2020-08-30 DIAGNOSIS — I2510 Atherosclerotic heart disease of native coronary artery without angina pectoris: Secondary | ICD-10-CM | POA: Diagnosis not present

## 2020-08-30 DIAGNOSIS — N186 End stage renal disease: Secondary | ICD-10-CM | POA: Diagnosis not present

## 2020-08-30 DIAGNOSIS — I509 Heart failure, unspecified: Secondary | ICD-10-CM | POA: Diagnosis not present

## 2020-08-30 DIAGNOSIS — I739 Peripheral vascular disease, unspecified: Secondary | ICD-10-CM | POA: Diagnosis not present

## 2020-08-30 DIAGNOSIS — D631 Anemia in chronic kidney disease: Secondary | ICD-10-CM | POA: Diagnosis not present

## 2020-08-30 DIAGNOSIS — I132 Hypertensive heart and chronic kidney disease with heart failure and with stage 5 chronic kidney disease, or end stage renal disease: Secondary | ICD-10-CM | POA: Diagnosis not present

## 2020-08-31 DIAGNOSIS — D631 Anemia in chronic kidney disease: Secondary | ICD-10-CM | POA: Diagnosis not present

## 2020-08-31 DIAGNOSIS — I132 Hypertensive heart and chronic kidney disease with heart failure and with stage 5 chronic kidney disease, or end stage renal disease: Secondary | ICD-10-CM | POA: Diagnosis not present

## 2020-08-31 DIAGNOSIS — N186 End stage renal disease: Secondary | ICD-10-CM | POA: Diagnosis not present

## 2020-08-31 DIAGNOSIS — I2510 Atherosclerotic heart disease of native coronary artery without angina pectoris: Secondary | ICD-10-CM | POA: Diagnosis not present

## 2020-08-31 DIAGNOSIS — I739 Peripheral vascular disease, unspecified: Secondary | ICD-10-CM | POA: Diagnosis not present

## 2020-08-31 DIAGNOSIS — I509 Heart failure, unspecified: Secondary | ICD-10-CM | POA: Diagnosis not present

## 2020-09-01 ENCOUNTER — Inpatient Hospital Stay (HOSPITAL_COMMUNITY): Admission: RE | Admit: 2020-09-01 | Payer: Medicare Other | Source: Ambulatory Visit

## 2020-09-01 DIAGNOSIS — N186 End stage renal disease: Secondary | ICD-10-CM | POA: Diagnosis not present

## 2020-09-01 DIAGNOSIS — I509 Heart failure, unspecified: Secondary | ICD-10-CM | POA: Diagnosis not present

## 2020-09-01 DIAGNOSIS — I132 Hypertensive heart and chronic kidney disease with heart failure and with stage 5 chronic kidney disease, or end stage renal disease: Secondary | ICD-10-CM | POA: Diagnosis not present

## 2020-09-01 DIAGNOSIS — I739 Peripheral vascular disease, unspecified: Secondary | ICD-10-CM | POA: Diagnosis not present

## 2020-09-01 DIAGNOSIS — D631 Anemia in chronic kidney disease: Secondary | ICD-10-CM | POA: Diagnosis not present

## 2020-09-01 DIAGNOSIS — I2510 Atherosclerotic heart disease of native coronary artery without angina pectoris: Secondary | ICD-10-CM | POA: Diagnosis not present

## 2020-09-07 DIAGNOSIS — N186 End stage renal disease: Secondary | ICD-10-CM | POA: Diagnosis not present

## 2020-09-07 DIAGNOSIS — I509 Heart failure, unspecified: Secondary | ICD-10-CM | POA: Diagnosis not present

## 2020-09-07 DIAGNOSIS — I132 Hypertensive heart and chronic kidney disease with heart failure and with stage 5 chronic kidney disease, or end stage renal disease: Secondary | ICD-10-CM | POA: Diagnosis not present

## 2020-09-07 DIAGNOSIS — I739 Peripheral vascular disease, unspecified: Secondary | ICD-10-CM | POA: Diagnosis not present

## 2020-09-07 DIAGNOSIS — I2510 Atherosclerotic heart disease of native coronary artery without angina pectoris: Secondary | ICD-10-CM | POA: Diagnosis not present

## 2020-09-07 DIAGNOSIS — D631 Anemia in chronic kidney disease: Secondary | ICD-10-CM | POA: Diagnosis not present

## 2020-09-09 DIAGNOSIS — I739 Peripheral vascular disease, unspecified: Secondary | ICD-10-CM | POA: Diagnosis not present

## 2020-09-09 DIAGNOSIS — N186 End stage renal disease: Secondary | ICD-10-CM | POA: Diagnosis not present

## 2020-09-09 DIAGNOSIS — I509 Heart failure, unspecified: Secondary | ICD-10-CM | POA: Diagnosis not present

## 2020-09-09 DIAGNOSIS — I132 Hypertensive heart and chronic kidney disease with heart failure and with stage 5 chronic kidney disease, or end stage renal disease: Secondary | ICD-10-CM | POA: Diagnosis not present

## 2020-09-09 DIAGNOSIS — I2510 Atherosclerotic heart disease of native coronary artery without angina pectoris: Secondary | ICD-10-CM | POA: Diagnosis not present

## 2020-09-09 DIAGNOSIS — D631 Anemia in chronic kidney disease: Secondary | ICD-10-CM | POA: Diagnosis not present

## 2020-09-10 DIAGNOSIS — N186 End stage renal disease: Secondary | ICD-10-CM | POA: Diagnosis not present

## 2020-09-10 DIAGNOSIS — I2510 Atherosclerotic heart disease of native coronary artery without angina pectoris: Secondary | ICD-10-CM | POA: Diagnosis not present

## 2020-09-10 DIAGNOSIS — Z6824 Body mass index (BMI) 24.0-24.9, adult: Secondary | ICD-10-CM | POA: Diagnosis not present

## 2020-09-10 DIAGNOSIS — I701 Atherosclerosis of renal artery: Secondary | ICD-10-CM | POA: Diagnosis not present

## 2020-09-10 DIAGNOSIS — G40909 Epilepsy, unspecified, not intractable, without status epilepticus: Secondary | ICD-10-CM | POA: Diagnosis not present

## 2020-09-10 DIAGNOSIS — I739 Peripheral vascular disease, unspecified: Secondary | ICD-10-CM | POA: Diagnosis not present

## 2020-09-10 DIAGNOSIS — D631 Anemia in chronic kidney disease: Secondary | ICD-10-CM | POA: Diagnosis not present

## 2020-09-10 DIAGNOSIS — I132 Hypertensive heart and chronic kidney disease with heart failure and with stage 5 chronic kidney disease, or end stage renal disease: Secondary | ICD-10-CM | POA: Diagnosis not present

## 2020-09-10 DIAGNOSIS — I509 Heart failure, unspecified: Secondary | ICD-10-CM | POA: Diagnosis not present

## 2020-09-13 DIAGNOSIS — I469 Cardiac arrest, cause unspecified: Secondary | ICD-10-CM | POA: Diagnosis not present

## 2020-09-13 DIAGNOSIS — I739 Peripheral vascular disease, unspecified: Secondary | ICD-10-CM | POA: Diagnosis not present

## 2020-09-13 DIAGNOSIS — I509 Heart failure, unspecified: Secondary | ICD-10-CM | POA: Diagnosis not present

## 2020-09-13 DIAGNOSIS — R404 Transient alteration of awareness: Secondary | ICD-10-CM | POA: Diagnosis not present

## 2020-09-13 DIAGNOSIS — I2510 Atherosclerotic heart disease of native coronary artery without angina pectoris: Secondary | ICD-10-CM | POA: Diagnosis not present

## 2020-09-13 DIAGNOSIS — D631 Anemia in chronic kidney disease: Secondary | ICD-10-CM | POA: Diagnosis not present

## 2020-09-13 DIAGNOSIS — I132 Hypertensive heart and chronic kidney disease with heart failure and with stage 5 chronic kidney disease, or end stage renal disease: Secondary | ICD-10-CM | POA: Diagnosis not present

## 2020-09-13 DIAGNOSIS — N186 End stage renal disease: Secondary | ICD-10-CM | POA: Diagnosis not present

## 2020-09-13 DIAGNOSIS — I499 Cardiac arrhythmia, unspecified: Secondary | ICD-10-CM | POA: Diagnosis not present

## 2020-09-29 ENCOUNTER — Encounter (HOSPITAL_COMMUNITY): Payer: Medicare Other

## 2020-10-11 DIAGNOSIS — 419620001 Death: Secondary | SNOMED CT | POA: Diagnosis not present

## 2020-10-11 DEATH — deceased
